# Patient Record
Sex: Female | Born: 1940 | Race: Black or African American | Hispanic: No | Smoking: Former smoker
Health system: Southern US, Community
[De-identification: ages and names within clinical notes are randomized; demographics above are authoritative.]

## PROBLEM LIST (undated history)

## (undated) DIAGNOSIS — J984 Other disorders of lung: Secondary | ICD-10-CM

## (undated) DIAGNOSIS — Z87891 Personal history of nicotine dependence: Secondary | ICD-10-CM

## (undated) DIAGNOSIS — J309 Allergic rhinitis, unspecified: Secondary | ICD-10-CM

## (undated) DIAGNOSIS — J449 Chronic obstructive pulmonary disease, unspecified: Secondary | ICD-10-CM

## (undated) DIAGNOSIS — J4489 Other specified chronic obstructive pulmonary disease: Secondary | ICD-10-CM

## (undated) DIAGNOSIS — R928 Other abnormal and inconclusive findings on diagnostic imaging of breast: Secondary | ICD-10-CM

## (undated) DIAGNOSIS — R030 Elevated blood-pressure reading, without diagnosis of hypertension: Secondary | ICD-10-CM

## (undated) HISTORY — DX: Personal history of nicotine dependence: Z87.891

## (undated) HISTORY — DX: Other disorders of lung: J98.4

## (undated) HISTORY — DX: Elevated blood-pressure reading, without diagnosis of hypertension: R03.0

## (undated) HISTORY — DX: Other specified chronic obstructive pulmonary disease: J44.89

## (undated) HISTORY — PX: BREAST SURGERY: SHX581

## (undated) HISTORY — DX: Chronic obstructive pulmonary disease, unspecified: J44.9

## (undated) HISTORY — DX: Allergic rhinitis, unspecified: J30.9

## (undated) HISTORY — DX: Other abnormal and inconclusive findings on diagnostic imaging of breast: R92.8

---

## 1967-12-20 HISTORY — PX: TUBAL LIGATION: SHX77

## 2009-10-23 ENCOUNTER — Ambulatory Visit: Payer: Self-pay | Admitting: Family Medicine

## 2009-10-23 LAB — HM PAP SMEAR

## 2009-11-18 ENCOUNTER — Ambulatory Visit: Payer: Self-pay | Admitting: Family Medicine

## 2009-12-01 ENCOUNTER — Ambulatory Visit: Payer: Self-pay | Admitting: Family Medicine

## 2010-06-03 ENCOUNTER — Ambulatory Visit: Payer: Self-pay | Admitting: General Surgery

## 2011-10-19 ENCOUNTER — Ambulatory Visit: Payer: Self-pay | Admitting: Family Medicine

## 2011-10-19 LAB — HM MAMMOGRAPHY

## 2011-10-20 IMAGING — CR DG CHEST 2V
1 series · 2 of 2 positions shown · non-contrast
Comparison: none

REASON FOR EXAM: Shortness of breath
COMMENTS:

[Series 1: view not recorded · 0.17mm/px · 2 of 2 slices shown]
[im 1/2]
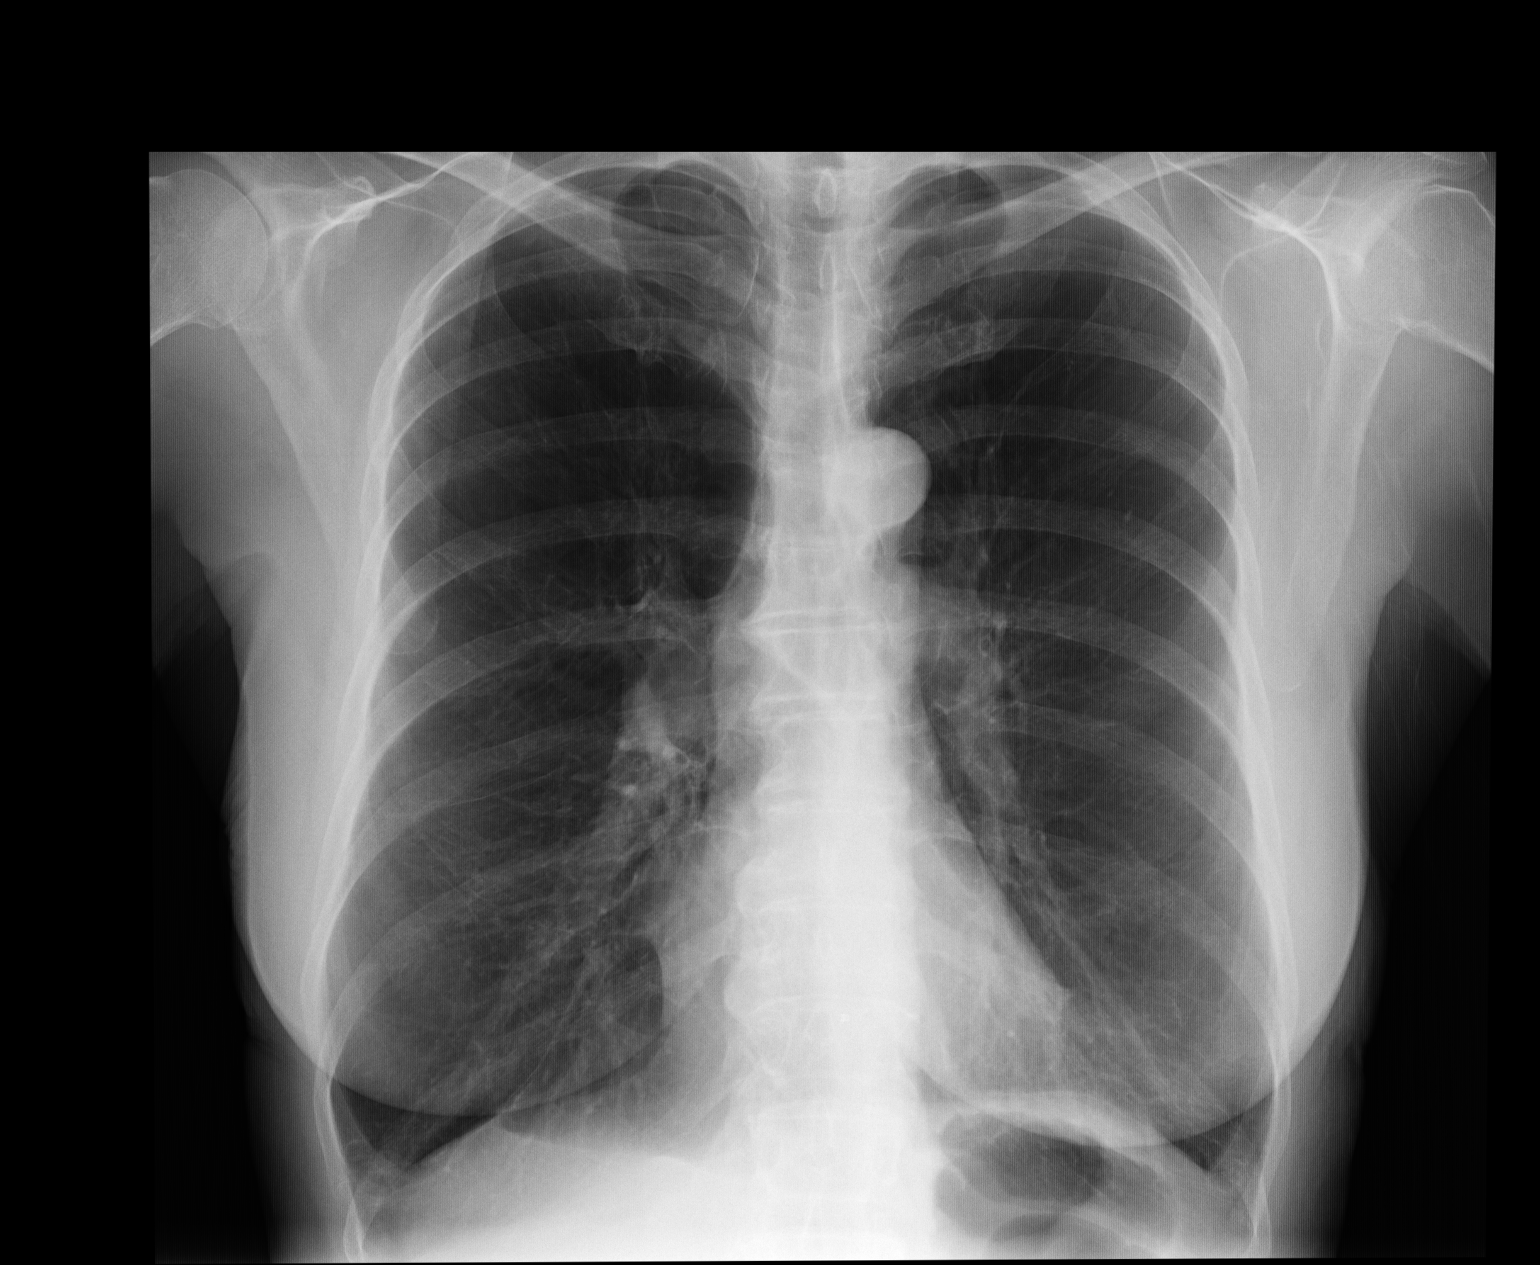
[im 2/2]
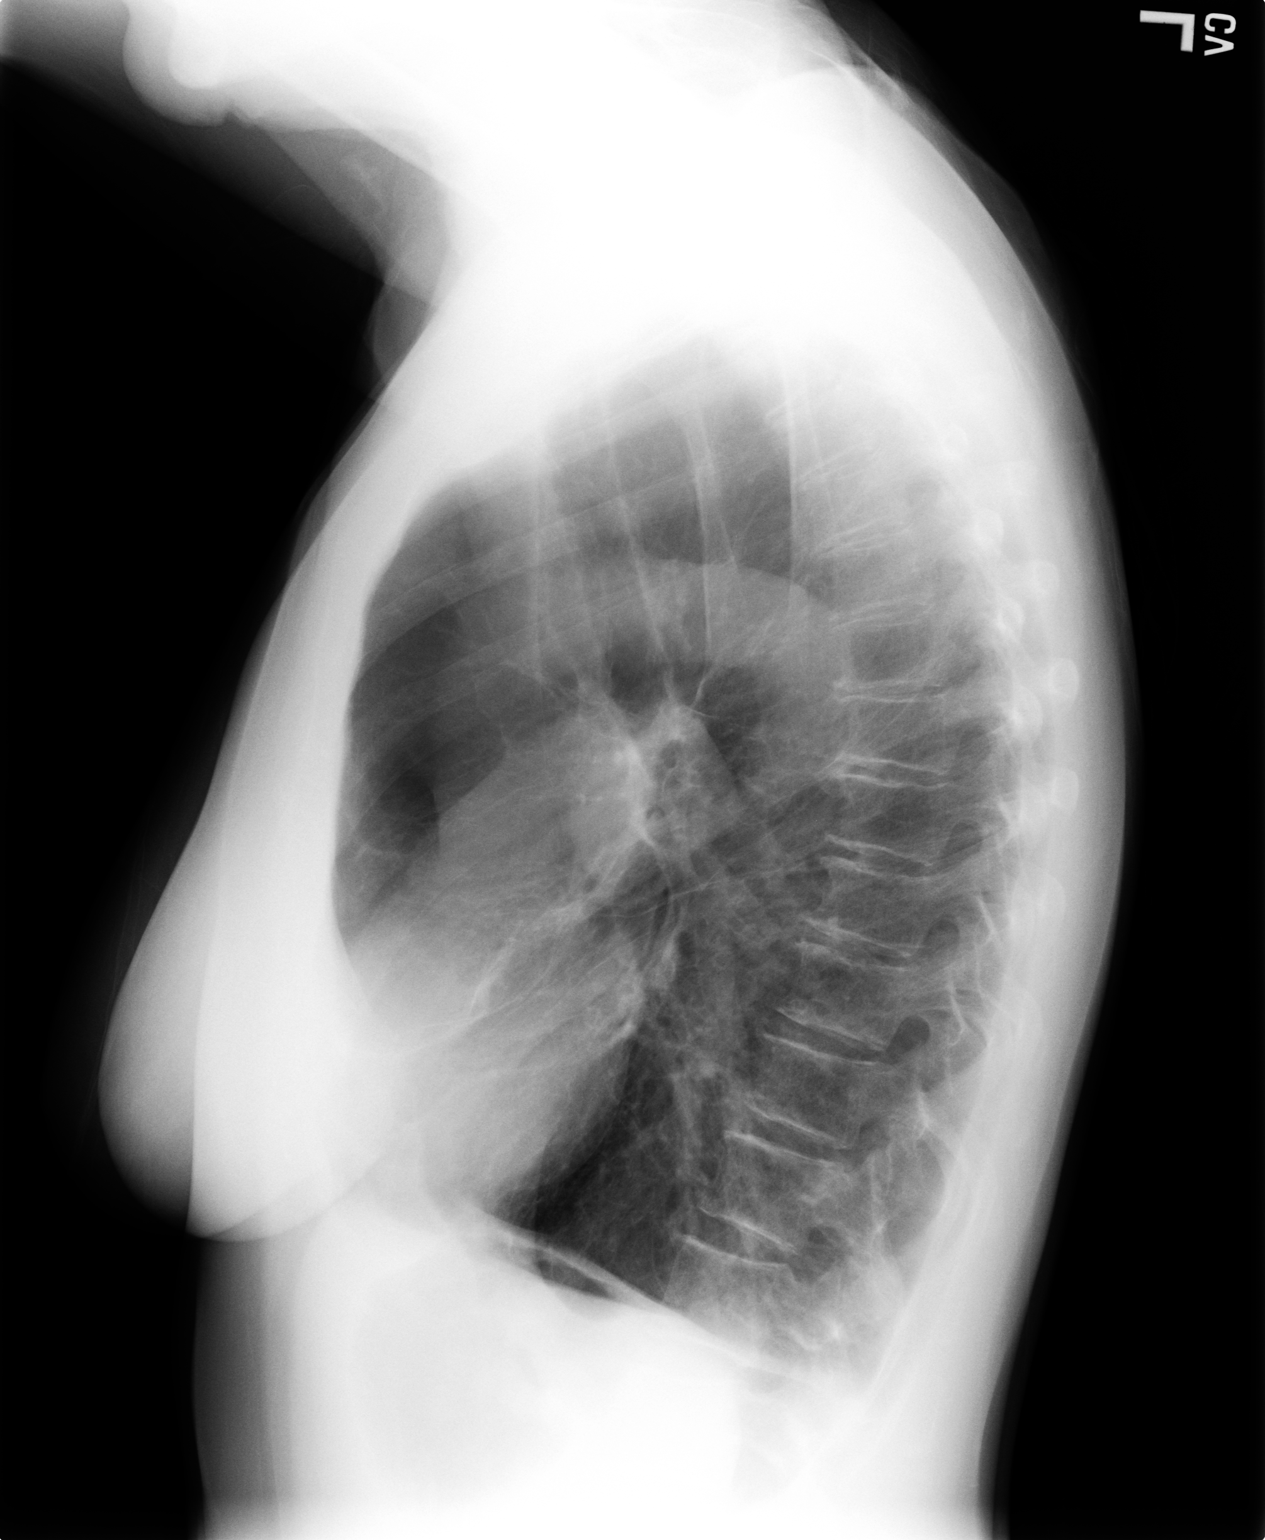

[2 of 2 positions shown; findings below may reference images not displayed]

PROCEDURE:     KDR - KDXR CHEST PA (OR AP) AND LAT  - October 23, 2009 [DATE]

RESULT:     The lung fields are clear. No pneumonia, pneumothorax or pleural
effusion is seen. The chest is hyperexpanded compatible with a history of
COPD or asthma. The mediastinal and osseous structures are normal in
appearance.
IMPRESSION: 1. The lung fields are clear.
2. Heart size is normal.
3. There is bilateral hyperexpansion of the lungs compatible with a history
of COPD or asthma.

## 2011-10-21 ENCOUNTER — Ambulatory Visit: Payer: Self-pay | Admitting: Family Medicine

## 2012-05-22 ENCOUNTER — Ambulatory Visit: Payer: Self-pay | Admitting: Family Medicine

## 2012-05-22 DIAGNOSIS — N951 Menopausal and female climacteric states: Secondary | ICD-10-CM | POA: Diagnosis not present

## 2012-05-22 DIAGNOSIS — M899 Disorder of bone, unspecified: Secondary | ICD-10-CM | POA: Diagnosis not present

## 2012-05-22 LAB — HM DEXA SCAN

## 2012-11-05 ENCOUNTER — Encounter: Payer: Self-pay | Admitting: *Deleted

## 2012-11-19 ENCOUNTER — Encounter: Payer: Self-pay | Admitting: Family Medicine

## 2012-12-05 ENCOUNTER — Encounter: Payer: Self-pay | Admitting: Family Medicine

## 2012-12-05 ENCOUNTER — Ambulatory Visit: Payer: Medicare Other

## 2012-12-05 ENCOUNTER — Ambulatory Visit: Payer: Medicare Other | Admitting: Family Medicine

## 2012-12-05 VITALS — BP 143/81 | HR 115 | Temp 98.0°F | Resp 17 | Ht 62.0 in | Wt 104.0 lb

## 2012-12-05 DIAGNOSIS — Z01419 Encounter for gynecological examination (general) (routine) without abnormal findings: Secondary | ICD-10-CM | POA: Diagnosis not present

## 2012-12-05 DIAGNOSIS — Z23 Encounter for immunization: Secondary | ICD-10-CM

## 2012-12-05 DIAGNOSIS — Z0000 Encounter for general adult medical examination without abnormal findings: Secondary | ICD-10-CM

## 2012-12-05 DIAGNOSIS — R0902 Hypoxemia: Secondary | ICD-10-CM | POA: Diagnosis not present

## 2012-12-05 DIAGNOSIS — R634 Abnormal weight loss: Secondary | ICD-10-CM | POA: Diagnosis not present

## 2012-12-05 DIAGNOSIS — F430 Acute stress reaction: Secondary | ICD-10-CM

## 2012-12-05 DIAGNOSIS — H00019 Hordeolum externum unspecified eye, unspecified eyelid: Secondary | ICD-10-CM

## 2012-12-05 DIAGNOSIS — J449 Chronic obstructive pulmonary disease, unspecified: Secondary | ICD-10-CM

## 2012-12-05 LAB — COMPREHENSIVE METABOLIC PANEL
ALT: 13 U/L (ref 0–35)
AST: 18 U/L (ref 0–37)
Alkaline Phosphatase: 36 U/L — ABNORMAL LOW (ref 39–117)
CO2: 28 mEq/L (ref 19–32)
Creat: 0.64 mg/dL (ref 0.50–1.10)
Sodium: 138 mEq/L (ref 135–145)
Total Bilirubin: 0.7 mg/dL (ref 0.3–1.2)
Total Protein: 6.9 g/dL (ref 6.0–8.3)

## 2012-12-05 LAB — POCT CBC
Hemoglobin: 13.5 g/dL (ref 12.2–16.2)
Lymph, poc: 3 (ref 0.6–3.4)
MCH, POC: 25.9 pg — AB (ref 27–31.2)
MCHC: 29.5 g/dL — AB (ref 31.8–35.4)
MID (cbc): 0.6 (ref 0–0.9)
MPV: 7.6 fL (ref 0–99.8)
POC Granulocyte: 4.1 (ref 2–6.9)
POC LYMPH PERCENT: 39 %L (ref 10–50)
POC MID %: 7.4 %M (ref 0–12)
Platelet Count, POC: 321 10*3/uL (ref 142–424)
RDW, POC: 13.9 %
WBC: 7.7 10*3/uL (ref 4.6–10.2)

## 2012-12-05 LAB — POCT URINALYSIS DIPSTICK
Leukocytes, UA: NEGATIVE
Protein, UA: NEGATIVE
Urobilinogen, UA: 0.2
pH, UA: 7

## 2012-12-05 LAB — TSH: TSH: 3.791 u[IU]/mL (ref 0.350–4.500)

## 2012-12-05 LAB — PULMONARY FUNCTION TEST

## 2012-12-05 NOTE — Patient Instructions (Addendum)
1. Routine general medical examination at a health care facility  POCT urinalysis dipstick, EKG 12-Lead, Pulmonary function test, IFOBT POC (occult bld, rslt in office), POCT CBC, Comprehensive metabolic panel, TSH  2. Need for prophylactic vaccination and inoculation against influenza  Flu vaccine greater than or equal to 69yo preservative free IM, POCT CBC, Comprehensive metabolic panel, TSH, Sedimentation Rate  3. Routine gynecological examination  Pap IG (Image Guided), POCT CBC, Comprehensive metabolic panel, TSH, Sedimentation Rate, MM Digital Screening  4. Hypoxia  DG Chest 2 View, POCT CBC, Comprehensive metabolic panel, TSH, Sedimentation Rate  5. Chronic airway obstruction, not elsewhere classified  Ambulatory referral to Pulmonology  6. Weight loss    7. Stress reaction    8. Hordeolum

## 2012-12-05 NOTE — Progress Notes (Signed)
[address]   [place], [place]  [address]   [phone]  Subjective:    Patient ID: Anita Murphy, female    DOB: October 11, 1941, 71 y.o.   MRN: [id number]  HPI This 71 y.o. female presents to establish care and for CPE.  Last physical 2011.   Pap smear 10/23/2009. Mammogram 10/19/11.  Colonoscopy never.  May consider in future a colonoscopy. Pneumovax 10/20/2011 Influenza vaccine 10/20/2011. Eye exam never. Bone density  2013. Dental exam 2013.  Tooth pulled.   1.  L eye soreness:  Using eye drops to L eye.  Felt a watery; no drainage; dark area along L upper lid.  Feels like foreign body in eye in mornings.  No blurred vision; no pain with blinking.  No photophobia.  No pain in eye.    2.  COPD:  Six month follow-up; about the same; Symbicort twice daily; Spiriva once daily.  Did get a paper; got into donut hole; ended up getting paperwork; will take $50 off; last time $65 on Symbicort; Spiriva still in donut hole.  Ran out of Ventolin in September with help.  Daughter notices increased work of breathing with exertion while walking up stairs.    3.  Weight loss: weight down nine pound in past six months; non-intentional; multiple family stressors; has home stress; denies verbal or domestic/physical abuse.   Mother also with Alzheimer's dementia; has been visiting with mother three days per week for past six months.  Thinks weight loss is stress induced.  No night sweats; no fevers; no nausea.  No bloating.     Review of Systems  Constitutional: Positive for unexpected weight change. Negative for fever, chills, diaphoresis, activity change, appetite change and fatigue.  HENT: Negative for hearing loss, ear pain, nosebleeds, congestion, sore throat, facial swelling, rhinorrhea, sneezing, drooling, mouth sores, trouble swallowing, neck pain, neck stiffness, dental problem, voice change, postnasal drip, sinus pressure, tinnitus and ear discharge.   Eyes: Positive for pain. Negative for  photophobia, discharge, redness, itching and visual disturbance.  Respiratory: Positive for shortness of breath. Negative for apnea, cough, choking, chest tightness, wheezing and stridor.   Cardiovascular: Negative for chest pain, palpitations and leg swelling.  Gastrointestinal: Negative for nausea, vomiting, abdominal pain, diarrhea, constipation, blood in stool, abdominal distention, anal bleeding and rectal pain.  Genitourinary: Negative for dysuria, urgency, frequency, hematuria, flank pain, decreased urine volume, vaginal bleeding, vaginal discharge, enuresis, difficulty urinating, genital sores, vaginal pain, menstrual problem, pelvic pain and dyspareunia.  Musculoskeletal: Negative for myalgias, back pain, joint swelling, arthralgias and gait problem.  Skin: Negative for color change, pallor, rash and wound.  Neurological: Negative for dizziness, tremors, seizures, syncope, facial asymmetry, speech difficulty, weakness, light-headedness, numbness and headaches.  Hematological: Negative for adenopathy. Does not bruise/bleed easily.  Psychiatric/Behavioral: Positive for dysphoric mood. Negative for suicidal ideas, hallucinations, behavioral problems, confusion, sleep disturbance, self-injury, decreased concentration and agitation. The patient is nervous/anxious. The patient is not hyperactive.         Past Medical History  Diagnosis Date  . Other diseases of lung, not elsewhere classified   . Allergic rhinitis, cause unspecified   . Shortness of breath   . Personal history of tobacco use, presenting hazards to health   . Elevated blood pressure reading without diagnosis of hypertension   . Chronic airway obstruction, not elsewhere classified   . Other (abnormal) findings on radiological examination of breast     Past Surgical History  Procedure Date  . Tubal ligation 1969  .  Breast cyst aspiration 2011    aspirated with resolution    Prior to Admission medications   Medication  Sig Start Date End Date Taking? Authorizing Provider  budesonide-formoterol (SYMBICORT) 160-4.5 MCG/ACT inhaler Inhale 2 puffs into the lungs 2 (two) times daily.   Yes Historical Provider, MD  tiotropium (SPIRIVA HANDIHALER) 18 MCG inhalation capsule Place 18 mcg into inhaler and inhale daily.   Yes Historical Provider, MD    No Known Allergies  History   Social History  . Marital Status: Married    Spouse Name: N/A    Number of Children: 4  . Years of Education: 12   Occupational History  . 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 Smoking status: Former Smoker -- 1.0 packs/day for 30 years    Types: Cigarettes  . Smokeless tobacco: Not on file     Comment: quit in 2004  . Alcohol Use: No  . Drug Use: No  . Sexually Active: No   Other Topics Concern  . Not on file   Social History Narrative   Always uses seat belts. Smoke alarm and carbon monoxide detector in the home. Guns in the home stored in locked cabinet. Married x 50 yrs, happy, no abuse. Lives with spouse, one daughter, and grandson. Exercise: walking daily x 30 minutes. Caffeine use: Coffee 2 servings per day.    Family History  Problem Relation Age of Onset  . Lung disease Sister   . Hypothyroidism Mother   . Aortic stenosis Mother   . Cancer Father     Mesothelioma  . Heart disease Father   . Cancer Brother     Objective:   Physical Exam  Nursing note and vitals reviewed. Constitutional: She is oriented to person, place, and time. She appears well-developed and well-nourished. No distress.  HENT:  Head: Normocephalic and atraumatic.  Right Ear: External ear normal.  Left Ear: External ear normal.  Nose: Nose normal.  Mouth/Throat: Oropharynx is clear and moist. No oropharyngeal exudate.  Eyes: Conjunctivae normal are normal. Pupils are equal, round, and reactive to light. Right eye exhibits no discharge. Left eye exhibits hordeolum. Left eye exhibits no discharge. No  scleral icterus.  Neck: Normal range of motion. Neck supple. No thyromegaly present.  Cardiovascular: Normal rate, regular rhythm, normal heart sounds and intact distal pulses.  Exam reveals no gallop and no friction rub.   No murmur heard. Pulmonary/Chest: Effort normal. No respiratory distress. She has no wheezes. She has no rales.       DISTANT BREATH SOUNDS THROUGHOUT.  Abdominal: Soft. Bowel sounds are normal. She exhibits no distension and no mass. There is no tenderness. There is no rebound and no guarding. Hernia confirmed negative in the right inguinal area and confirmed negative in the left inguinal area.  Genitourinary: Vagina normal and uterus normal. Rectal exam shows no external hemorrhoid, no fissure, no mass, no tenderness and anal tone normal. Guaiac negative stool. No breast swelling, tenderness, discharge or bleeding. There is no rash, tenderness or lesion on the right labia. There is no rash, tenderness or lesion on the left labia. Cervix exhibits no motion tenderness, no discharge and no friability. Right adnexum displays no mass, no tenderness and no fullness. Left adnexum displays no mass, no tenderness and no fullness. No vaginal discharge found.  Musculoskeletal: Normal range of motion.  Lymphadenopathy:    She has no cervical adenopathy.  Right: No inguinal adenopathy present.       Left: No inguinal adenopathy present.  Neurological: She is alert and oriented to person, place, and time. She has normal reflexes. No cranial nerve deficit. She exhibits normal muscle tone. Coordination normal.  Skin: Skin is warm and dry. No rash noted. She is not diaphoretic. No erythema. No pallor.  Psychiatric: She has a normal mood and affect. Her behavior is normal. Judgment and thought content normal.    EKG:  NSR;    [facility] reading (PRIMARY) by  Dr. Katrinka Blazing.  CXR: +prominent aortic notch. SPIROMETRY:  FVC 59%, FEVE1%  30%; FEV1/FVC %  51% -- SEVERE OBSTRUCTION.   Assessment &  Plan:   1. Routine general medical examination at a health care facility  POCT urinalysis dipstick, EKG 12-Lead, Pulmonary function test, IFOBT POC (occult bld, rslt in office), POCT CBC, Comprehensive metabolic panel, TSH  2. Need for prophylactic vaccination and inoculation against influenza  Flu vaccine greater than or equal to 3yo preservative free IM, POCT CBC, Comprehensive metabolic panel, TSH, Sedimentation Rate  3. Routine gynecological examination  Pap IG (Image Guided), POCT CBC, Comprehensive metabolic panel, TSH, Sedimentation Rate, MM Digital Screening  4. Hypoxia  DG Chest 2 View, POCT CBC, Comprehensive metabolic panel, TSH, Sedimentation Rate  5. Chronic airway obstruction, not elsewhere classified  Ambulatory referral to Pulmonology  6. Weight loss    7. Stress reaction    8. Hordeolum

## 2012-12-07 ENCOUNTER — Encounter: Payer: Self-pay | Admitting: Family Medicine

## 2012-12-07 DIAGNOSIS — J449 Chronic obstructive pulmonary disease, unspecified: Secondary | ICD-10-CM | POA: Insufficient documentation

## 2012-12-07 DIAGNOSIS — H00019 Hordeolum externum unspecified eye, unspecified eyelid: Secondary | ICD-10-CM | POA: Insufficient documentation

## 2012-12-07 DIAGNOSIS — Z23 Encounter for immunization: Secondary | ICD-10-CM | POA: Insufficient documentation

## 2012-12-07 DIAGNOSIS — Z0000 Encounter for general adult medical examination without abnormal findings: Secondary | ICD-10-CM | POA: Insufficient documentation

## 2012-12-07 DIAGNOSIS — F430 Acute stress reaction: Secondary | ICD-10-CM | POA: Insufficient documentation

## 2012-12-07 DIAGNOSIS — Z01419 Encounter for gynecological examination (general) (routine) without abnormal findings: Secondary | ICD-10-CM | POA: Insufficient documentation

## 2012-12-07 DIAGNOSIS — R634 Abnormal weight loss: Secondary | ICD-10-CM | POA: Insufficient documentation

## 2012-12-07 LAB — PAP IG (IMAGE GUIDED)

## 2012-12-07 NOTE — Assessment & Plan Note (Signed)
New.  Pulse oximetry decreases to 88% with ambulation; pulse oximetry 96% at rest.  Pt declined rx for oxygen PRN. Declined nocturnal oximetry.  Obtain CXR. Likely due to COPD severe.

## 2012-12-07 NOTE — Assessment & Plan Note (Signed)
New.  Weight down 9 pounds in six months; obtain labs.  Obtain CXR.  Pap smear obtained; refer for mammogram. Hemosure negative in office yet no previous colonoscopy.  Pt admits to multiple family stressors in past six months.  Monitor closely; pt to work on weight gain.

## 2012-12-07 NOTE — Assessment & Plan Note (Signed)
New.  Pt would not discuss at length during visit; denies s/s of depression or anxiety; denies SI/HI.  Denies domestic violence.  Good family support.  Agreeable to RTC for acute worsening.

## 2012-12-07 NOTE — Assessment & Plan Note (Signed)
Administered at visit 

## 2012-12-07 NOTE — Assessment & Plan Note (Signed)
New.  L upper eyelid; recommend warm compresses tid for next week.  If no improvement in one week, to call office.

## 2012-12-07 NOTE — Assessment & Plan Note (Signed)
Persistent.  Compliance with Symbicort and Spiriva.  Declined rx for PRN oxygen. Declined order for nocturnal oximetry. Agreeable to pulmonology consultation for further management.

## 2012-12-07 NOTE — Assessment & Plan Note (Signed)
Anticipatory guidance --- weight gain.  Pap smear obtained.  Refer for mammogram.  Bone density scan UTD.  Immunizations -- s/p flu vaccine in office; check on coverage of Zostavax.   No evidence of depression by Beck's depression scale. Independent with ADLs.  No hearing loss; low fall risk.  FULL CODE.

## 2012-12-07 NOTE — Assessment & Plan Note (Signed)
Pap smear obtained; refer for mammogram. 

## 2013-02-02 NOTE — Progress Notes (Signed)
Reviewed and agree.

## 2013-04-17 ENCOUNTER — Telehealth: Payer: Self-pay

## 2013-04-17 NOTE — Telephone Encounter (Signed)
Pended, please advise on refills.

## 2013-04-17 NOTE — Telephone Encounter (Signed)
Pt states [facility] in [place] has sent in a refill request for her symbicort and spiriva.  Pt wants to verify that we have recd. Please call pt to advise

## 2013-04-18 MED ORDER — BUDESONIDE-FORMOTEROL FUMARATE 160-4.5 MCG/ACT IN AERO: 2.0000 | INHALATION_SPRAY | Freq: Two times a day (BID) | RESPIRATORY_TRACT | Status: DC

## 2013-04-18 MED ORDER — TIOTROPIUM BROMIDE MONOHYDRATE 18 MCG IN CAPS: 18.0000 ug | ORAL_CAPSULE | Freq: Every day | RESPIRATORY_TRACT | Status: DC

## 2013-04-18 NOTE — Telephone Encounter (Signed)
Called her to advise this is done.

## 2013-04-18 NOTE — Telephone Encounter (Signed)
Done

## 2013-04-29 ENCOUNTER — Telehealth: Payer: Self-pay

## 2013-05-03 ENCOUNTER — Institutional Professional Consult (permissible substitution): Payer: Self-pay | Admitting: Internal Medicine

## 2013-05-31 ENCOUNTER — Ambulatory Visit (INDEPENDENT_AMBULATORY_CARE_PROVIDER_SITE_OTHER): Payer: Medicare Other | Admitting: Internal Medicine

## 2013-05-31 ENCOUNTER — Encounter: Payer: Self-pay | Admitting: Internal Medicine

## 2013-05-31 VITALS — BP 162/86 | HR 70 | Temp 97.5°F | Ht 61.25 in | Wt 106.0 lb

## 2013-05-31 DIAGNOSIS — J449 Chronic obstructive pulmonary disease, unspecified: Secondary | ICD-10-CM

## 2013-05-31 MED ORDER — LEVALBUTEROL TARTRATE 45 MCG/ACT IN AERO: 1.0000 | INHALATION_SPRAY | RESPIRATORY_TRACT | Status: DC | PRN

## 2013-05-31 NOTE — Progress Notes (Signed)
  Subjective:    Patient ID: Anita Murphy, female    DOB: Sep 08, 1941  MRN: [id number]  HPI  84 yobf quit smoking 2004 with no resp problems until around 2009 referred to pulmonary clinic 05/31/2013 by DR Nilda Simmer for ? Copd    05/31/2013 1st pulmonary ov cc acutely short of breath p exp to cleaner in 2009  and since then variable doe on symbicort and spiriva  - seems better with saba for only  A few hours then wors again.   No obvious daytime variabilty or assoc chronic cough or cp or chest tightness, subjective wheeze overt sinus or hb symptoms. No unusual other exposure hx or h/o childhood pna/ asthma or knowledge of premature birth.   Sleeping ok without nocturnal  or early am exacerbation  of respiratory  c/o's or need for noct saba. Also denies any obvious fluctuation of symptoms with weather or environmental changes or other aggravating or alleviating factors except as outlined above   Review of Systems  Constitutional: Negative for fever, chills and unexpected weight change.  HENT: Negative for ear pain, nosebleeds, congestion, sore throat, rhinorrhea, sneezing, trouble swallowing, dental problem, voice change, postnasal drip and sinus pressure.   Eyes: Negative for visual disturbance.  Respiratory: Positive for shortness of breath. Negative for cough and choking.   Cardiovascular: Negative for chest pain and leg swelling.  Gastrointestinal: Negative for vomiting, abdominal pain and diarrhea.  Genitourinary: Negative for difficulty urinating.  Musculoskeletal: Negative for arthralgias.  Skin: Negative for rash.  Neurological: Negative for tremors, syncope and headaches.  Hematological: Does not bruise/bleed easily.       Objective:   Physical Exam  amb bf nad  Wt Readings from Last 3 Encounters:  05/31/13 106 lb (48.081 kg)  12/05/12 104 lb (47.174 kg)  10/20/11 121 lb (54.885 kg)    HEENT mild turbinate edema.  Oropharynx no thrush or excess pnd or cobblestoning.   No JVD or cervical adenopathy. Mild accessory muscle hypertrophy. Trachea midline, nl thryroid. Chest was hyperinflated by percussion with diminished breath sounds and moderate increased exp time without wheeze. Hoover sign positive at mid inspiration. Regular rate and rhythm without murmur gallop or rub or increase P2 or edema.  Abd: no hsm, nl excursion. Ext warm without cyanosis or clubbing.   cxr nov 2010 c/w copd     Assessment & Plan:

## 2013-05-31 NOTE — Patient Instructions (Addendum)
Continue Symbicort 160 Take 2 puffs first thing in am and then another 2 puffs about 12 hours later.  And spiriva one daily   Only use your albuterol (xopenex)as a rescue medication to be used if you can't catch your breath by resting or doing a relaxed purse lip breathing pattern. The less you use it, the better it will work when you need it.  Ok to 2 puffs every 4 hours if needed  Please schedule a follow up office visit in 4-6  weeks, sooner if needed with pfts on return

## 2013-06-02 NOTE — Assessment & Plan Note (Addendum)
Severe by previous pft's GOLD III or IV with difficult to control symptoms  DDX of  difficult airways managment all start with A and  include Adherence, Ace Inhibitors, Acid Reflux, Active Sinus Disease, Alpha 1 Antitripsin deficiency, Anxiety masquerading as Airways dz,  ABPA,  allergy(esp in young), Aspiration (esp in elderly), Adverse effects of DPI,  Active smokers, plus two Bs  = Bronchiectasis and Beta blocker use..and one C= CHF  Adherence is always the initial "prime suspect" and is a multilayered concern that requires a "trust but verify" approach in every patient - starting with knowing how to use medications, especially inhalers, correctly, keeping up with refills and understanding the fundamental difference between maintenance and prns vs those medications only taken for a very short course and then stopped and not refilled.   The proper method of use, as well as anticipated side effects, of a metered-dose inhaler are discussed and demonstrated to the patient. Improved effectiveness after extensive coaching during this visit to a level of approximately  75% from a baseline of < 25% so may do better just by using the meds she's already supposed to be using.  Will regroup for fresh set of pft's p 6 weeks on combo of symbicort/spiriva and prn xopenex  See instructions for specific recommendations which were reviewed directly with the patient who was given a copy with highlighter outlining the key components.

## 2013-07-16 ENCOUNTER — Ambulatory Visit: Payer: Medicare Other | Admitting: Internal Medicine

## 2013-08-07 ENCOUNTER — Other Ambulatory Visit: Payer: Self-pay | Admitting: Family Medicine

## 2013-08-09 ENCOUNTER — Telehealth: Payer: Self-pay

## 2013-08-09 NOTE — Telephone Encounter (Signed)
Pt notified that symbicort was sent to pharm 2 days ago and that she would need an OV for more-however pt has an appt with pulm on 9/3 and will see if he will RF it for longer since he does her other 2 meds

## 2013-08-09 NOTE — Telephone Encounter (Signed)
Pt states that today she will be running out of her symbicort and would like to check the status of the request to refill that medication. Best# [phone]

## 2013-08-21 ENCOUNTER — Encounter: Payer: Self-pay | Admitting: Internal Medicine

## 2013-08-21 ENCOUNTER — Ambulatory Visit (INDEPENDENT_AMBULATORY_CARE_PROVIDER_SITE_OTHER): Payer: Medicare Other | Admitting: Internal Medicine

## 2013-08-21 VITALS — BP 150/90 | HR 80 | Temp 98.0°F | Ht 61.0 in | Wt 100.0 lb

## 2013-08-21 DIAGNOSIS — R634 Abnormal weight loss: Secondary | ICD-10-CM | POA: Diagnosis not present

## 2013-08-21 DIAGNOSIS — J449 Chronic obstructive pulmonary disease, unspecified: Secondary | ICD-10-CM

## 2013-08-21 LAB — PULMONARY FUNCTION TEST

## 2013-08-21 MED ORDER — TIOTROPIUM BROMIDE MONOHYDRATE 18 MCG IN CAPS: 18.0000 ug | ORAL_CAPSULE | Freq: Every day | RESPIRATORY_TRACT | Status: DC

## 2013-08-21 NOTE — Assessment & Plan Note (Addendum)
-  Spirometry 12/05/12 FVC 59%, FEV1%  30%; FEV1/FVC %  51%.  - PFT's  08/21/2013   FEV1  0.52 ( 33%) ratio 42 and no change B2  Pneumovax 10/20/2011.  Influenza vaccine 12/05/12. - hfa 75% p coaching 05/31/13 and 08/21/2013   DDX of  difficult airways managment all start with A and  include Adherence, Ace Inhibitors, Acid Reflux, Active Sinus Disease, Alpha 1 Antitripsin deficiency, Anxiety masquerading as Airways dz,  ABPA,  allergy(esp in young), Aspiration (esp in elderly), Adverse effects of DPI,  Active smokers, plus two Bs  = Bronchiectasis and Beta blocker use..and one C= CHF   Adherence is always the initial "prime suspect" and is a multilayered concern that requires a "trust but verify" approach in every patient - starting with knowing how to use medications, especially inhalers, correctly, keeping up with refills and understanding the fundamental difference between maintenance and prns vs those medications only taken for a very short course and then stopped and not refilled. The proper method of use, as well as anticipated side effects, of a metered-dose inhaler are discussed and demonstrated to the patient. Improved effectiveness after extensive coaching during this visit to a level of approximately  90% with dpi from baseline of close to zero and 75% with mdi with same baseline  Therefore don't really need to look further > if not able to use inhalers correctly on f/u may need to consider change to breo  Or stop spiriva and symbicort  and use anoro since did better with dpi than mdi    Each maintenance medication was reviewed in detail including most importantly the difference between maintenance and as needed and under what circumstances the prns are to be used.  Please see instructions for details which were reviewed in writing and the patient given a copy.

## 2013-08-21 NOTE — Progress Notes (Signed)
  Subjective:    Patient ID: Anita Murphy, female    DOB: [birth year]  MRN: [id number]    Brief patient profile:  62 yobf quit smoking 2004 with no resp problems until around 2009 referred to pulmonary clinic 05/31/2013 by DR Nilda Simmer for ? Copd > proved to have GOLD III severity 08/21/2013   HPI 05/31/2013 1st pulmonary ov cc acutely short of breath p exp to cleaner in 2009  and since then variable doe on symbicort and spiriva  - seems better with saba for only  A few hours then worse again.  rec Continue Symbicort 160 Take 2 puffs first thing in am and then another 2 puffs about 12 hours later.  And spiriva one daily  Only use your albuterol (xopenex)as a rescue medication   08/21/2013 f/u ov/[facility]  GOLD III copd Chief Complaint  Patient presents with  . Followup with PFT    Pt states her breathing has improved some since her last visit. No new co's today.    other than steps does ok with adls, grocery shopping. Not doing well with use of inhalers (see a/p).   No obvious daytime variabilty or assoc chronic cough or cp or chest tightness, subjective wheeze overt sinus or hb symptoms. No unusual other exposure hx or h/o childhood pna/ asthma or knowledge of premature birth.   Sleeping ok without nocturnal  or early am exacerbation  of respiratory  c/o's or need for noct saba. Also denies any obvious fluctuation of symptoms with weather or environmental changes or other aggravating or alleviating factors except as outlined above  .   Current Medications, Allergies, Past Medical History, Past Surgical History, Family History, and Social History were reviewed in [employer] record.  ROS  The following are not active complaints unless bolded sore throat, dysphagia, dental problems, itching, sneezing,  nasal congestion or excess/ purulent secretions, ear ache,   fever, chills, sweats, unintended wt loss, pleuritic or exertional cp, hemoptysis,  orthopnea pnd or leg  swelling, presyncope, palpitations, heartburn, abdominal pain, anorexia, nausea, vomiting, diarrhea  or change in bowel or urinary habits, change in stools or urine, dysuria,hematuria,  rash, arthralgias, visual complaints, headache, numbness weakness or ataxia or problems with walking or coordination,  change in mood/affect or memory.          Objective:   Physical Exam  amb bf nad  08/21/2013         100  Wt Readings from Last 3 Encounters:  05/31/13 106 lb (48.081 kg)  12/05/12 104 lb (47.174 kg)  10/20/11 121 lb (54.885 kg)    HEENT mild turbinate edema.  Oropharynx no thrush or excess pnd or cobblestoning.  No JVD or cervical adenopathy. Mild accessory muscle hypertrophy. Trachea midline, nl thryroid. Chest was hyperinflated by percussion with diminished breath sounds and moderate increased exp time without wheeze. Hoover sign positive at mid inspiration. Regular rate and rhythm without murmur gallop or rub or increase P2 or edema.  Abd: no hsm, nl excursion. Ext warm without cyanosis or clubbing.   cxr nov 2010 c/w copd     Assessment & Plan:

## 2013-08-21 NOTE — Patient Instructions (Addendum)
Work on inhaler technique:  relax and gently blow all the way out then take a nice smooth deep breath back in, triggering the inhaler at same time you start breathing in.  Hold for up to 5 seconds if you can.  Rinse and gargle with water when done   If your mouth or throat starts to bother you,   I suggest you time the inhaler to your dental care and after using the inhaler(s) brush teeth and tongue with a baking soda containing toothpaste and when you rinse this out, gargle with it first to see if this helps your mouth and throat.     Please schedule a follow up visit in 3 months but call sooner if needed  Late add   if not able to use inhalers correctly on f/u may need to consider change to breo  Or stop spiriva and symbicort and use anoro since did better with dpi than mdi

## 2013-08-21 NOTE — Progress Notes (Signed)
Spirometry before and after done today. Pt was unable to perform dlco and lung volumes.

## 2013-08-21 NOTE — Assessment & Plan Note (Signed)
Wt Readings from Last 3 Encounters:  08/21/13 100 lb (45.36 kg)  05/31/13 106 lb (48.081 kg)  12/05/12 104 lb (47.174 kg)     Notes indicate cxr planned but none in system since 2010 > defer w/u to Dr Katrinka Blazing

## 2013-09-06 ENCOUNTER — Other Ambulatory Visit: Payer: Self-pay | Admitting: Family Medicine

## 2013-09-06 NOTE — Telephone Encounter (Signed)
Needs OV.  

## 2013-09-12 ENCOUNTER — Encounter: Payer: Self-pay | Admitting: Internal Medicine

## 2013-10-31 ENCOUNTER — Other Ambulatory Visit: Payer: Self-pay | Admitting: Physician Assistant

## 2013-11-05 ENCOUNTER — Telehealth: Payer: Self-pay | Admitting: Internal Medicine

## 2013-11-05 MED ORDER — BUDESONIDE-FORMOTEROL FUMARATE 160-4.5 MCG/ACT IN AERO: 2.0000 | INHALATION_SPRAY | Freq: Two times a day (BID) | RESPIRATORY_TRACT | Status: DC

## 2013-11-05 NOTE — Telephone Encounter (Signed)
Refill sent. Pt advised. Jennifer Castillo, CMA  

## 2013-11-19 ENCOUNTER — Ambulatory Visit (INDEPENDENT_AMBULATORY_CARE_PROVIDER_SITE_OTHER)
Admission: RE | Admit: 2013-11-19 | Discharge: 2013-11-19 | Disposition: A | Discharge status: Home / Self Care | Payer: Medicare Other | Source: Ambulatory Visit | Attending: Internal Medicine | Admitting: Internal Medicine

## 2013-11-19 ENCOUNTER — Encounter: Payer: Self-pay | Admitting: Internal Medicine

## 2013-11-19 ENCOUNTER — Ambulatory Visit (INDEPENDENT_AMBULATORY_CARE_PROVIDER_SITE_OTHER): Payer: Medicare Other | Admitting: Internal Medicine

## 2013-11-19 VITALS — BP 140/80 | HR 63 | Temp 97.4°F | Ht 61.0 in | Wt 104.0 lb

## 2013-11-19 DIAGNOSIS — J449 Chronic obstructive pulmonary disease, unspecified: Secondary | ICD-10-CM

## 2013-11-19 NOTE — Patient Instructions (Addendum)
Please remember to go to the  x-ray department downstairs for your tests - we will call you with the results when they are available.  Work on inhaler technique:  relax and gently blow all the way out then take a nice smooth deep breath back in, triggering the inhaler at same time you start breathing in.  Hold for up to 5 seconds if you can.  Rinse and gargle with water when done   Please schedule a follow up visit in 6 months but call sooner if needed

## 2013-11-19 NOTE — Assessment & Plan Note (Addendum)
-  Spirometry 12/05/12 FEV1  0.51 (30% ratio 40    - PFT's  08/21/2013   FEV1  0.52 ( 33%) ratio 42 and no change B2  The proper method of use, as well as anticipated side effects, of a metered-dose inhaler are discussed and demonstrated to the patient. Improved effectiveness after extensive coaching during this visit to a level of approximately  75% from baseline of < 25%  Not clear she'll be able to master hfa as she gets older if can't maintain better fxn now so low threshold to try breo if condition worsens at all.    Each maintenance medication was reviewed in detail including most importantly the difference between maintenance and as needed and under what circumstances the prns are to be used.  Please see instructions for details which were reviewed in writing and the patient given a copy.

## 2013-11-19 NOTE — Progress Notes (Signed)
Subjective:    Patient ID: Anita Murphy, female    DOB: 1941/03/02  MRN: [id number]    Brief patient profile:  34  yobf quit smoking 2004 with no resp problems until around 2009 referred to pulmonary clinic 05/31/2013 by DR Nilda Simmer for ? Copd > proved to have GOLD III severity 08/21/2013   HPI 05/31/2013 1st pulmonary ov cc acutely short of breath p exp to cleaner in 2009  and since then variable doe on symbicort and spiriva  - seems better with saba x  few hours then worse again.  rec Continue Symbicort 160 Take 2 puffs first thing in am and then another 2 puffs about 12 hours later.  And spiriva one daily  Only use your albuterol (xopenex)as a rescue medication   08/21/2013 f/u ov/Lekeith Wulf  GOLD III copd Chief Complaint  Patient presents with  . Followup with PFT    Pt states her breathing has improved some since her last visit. No new co's today.   other than steps does ok with adls, grocery shopping. Not doing well with use of inhalers (see a/p).  rec Work on inhaler technique:     Please schedule a follow up visit in 3 months but call sooner if needed  Late add   if not able to use inhalers correctly on f/u may need to consider change to breo  Or stop spiriva and symbicort and use anoro since did better with dpi than mdi  11/19/2013 f/u ov/Zia Kanner re: GOLD III COPD/ unable to use hfa effectively  Chief Complaint  Patient presents with  . Follow-up    Pt states that her breathing continues to improve. No new co's today.    No need for saba , not  limited from desired activities   No obvious daytime variabilty or assoc chronic cough or cp or chest tightness, subjective wheeze overt sinus or hb symptoms. No unusual other exposure hx or h/o childhood pna/ asthma or knowledge of premature birth.   Sleeping ok without nocturnal  or early am exacerbation  of respiratory  c/o's or need for noct saba. Also denies any obvious fluctuation of symptoms with weather or environmental changes or  other aggravating or alleviating factors except as outlined above  .   Current Medications, Allergies, Past Medical History, Past Surgical History, Family History, and Social History were reviewed in [employer] record.  ROS  The following are not active complaints unless bolded sore throat, dysphagia, dental problems, itching, sneezing,  nasal congestion or excess/ purulent secretions, ear ache,   fever, chills, sweats, unintended wt loss, pleuritic or exertional cp, hemoptysis,  orthopnea pnd or leg swelling, presyncope, palpitations, heartburn, abdominal pain, anorexia, nausea, vomiting, diarrhea  or change in bowel or urinary habits, change in stools or urine, dysuria,hematuria,  rash, arthralgias, visual complaints, headache, numbness weakness or ataxia or problems with walking or coordination,  change in mood/affect or memory.          Objective:   Physical Exam  amb bf nad  11/19/2013       104  08/21/2013         100  Wt Readings from Last 3 Encounters:  05/31/13 106 lb (48.081 kg)  12/05/12 104 lb (47.174 kg)  10/20/11 121 lb (54.885 kg)    HEENT mild turbinate edema.  Oropharynx no thrush or excess pnd or cobblestoning.  No JVD or cervical adenopathy. Mild accessory muscle hypertrophy. Trachea midline, nl thryroid. Chest was hyperinflated by percussion  with diminished breath sounds and moderate increased exp time without wheeze. Hoover sign positive at mid inspiration. Regular rate and rhythm without murmur gallop or rub or increase P2 or edema.  Abd: no hsm, nl excursion. Ext warm without cyanosis or clubbing.     CXR  11/19/2013 :  Mediastinum and hilar structures are normal. Mild basilar infiltrates versus atelectasis noted. Follow-up chest x-ray suggested to demonstrate clearing. No acute bony abnormality. Degenerative changes thoracic spine. My over read: nothing acute at all      Assessment & Plan:

## 2013-11-20 NOTE — Progress Notes (Signed)
Quick Note:  Spoke with pt and notified of results per Dr. Wert. Pt verbalized understanding and denied any questions.  ______ 

## 2014-01-07 ENCOUNTER — Telehealth: Payer: Self-pay | Admitting: Emergency Medicine

## 2014-01-07 MED ORDER — BUDESONIDE-FORMOTEROL FUMARATE 160-4.5 MCG/ACT IN AERO: 2.0000 | INHALATION_SPRAY | Freq: Two times a day (BID) | RESPIRATORY_TRACT | Status: DC

## 2014-01-07 NOTE — Telephone Encounter (Signed)
RX has been sent for pt  LMTCB x1

## 2014-01-08 NOTE — Telephone Encounter (Signed)
Pt advised. Dianely Krehbiel, CMA  

## 2014-10-02 ENCOUNTER — Telehealth: Payer: Self-pay | Admitting: Internal Medicine

## 2014-10-02 MED ORDER — LEVALBUTEROL TARTRATE 45 MCG/ACT IN AERO: 1.0000 | INHALATION_SPRAY | RESPIRATORY_TRACT | Status: DC | PRN

## 2014-10-02 MED ORDER — TIOTROPIUM BROMIDE MONOHYDRATE 18 MCG IN CAPS: 18.0000 ug | ORAL_CAPSULE | Freq: Every day | RESPIRATORY_TRACT | Status: DC

## 2014-10-02 NOTE — Telephone Encounter (Signed)
Attempted to call pt but no VM has been set up.  rx for these 2 meds have been sent in for refills per pts request.  Will try to call the pt back later.

## 2014-10-03 NOTE — Telephone Encounter (Signed)
[facility]  [facility]

## 2014-10-06 NOTE — Telephone Encounter (Signed)
attmepted to call x 3.  The VM is full and not able to leave a message.  Will sign off of this message and wait for pt to call back.

## 2014-10-21 ENCOUNTER — Ambulatory Visit (INDEPENDENT_AMBULATORY_CARE_PROVIDER_SITE_OTHER)
Admission: RE | Admit: 2014-10-21 | Discharge: 2014-10-21 | Disposition: A | Discharge status: Home / Self Care | Payer: Medicare Other | Source: Ambulatory Visit | Attending: Internal Medicine | Admitting: Internal Medicine

## 2014-10-21 ENCOUNTER — Ambulatory Visit (INDEPENDENT_AMBULATORY_CARE_PROVIDER_SITE_OTHER): Payer: Medicare Other | Admitting: Internal Medicine

## 2014-10-21 ENCOUNTER — Encounter: Payer: Self-pay | Admitting: Internal Medicine

## 2014-10-21 DIAGNOSIS — J449 Chronic obstructive pulmonary disease, unspecified: Secondary | ICD-10-CM

## 2014-10-21 DIAGNOSIS — J439 Emphysema, unspecified: Secondary | ICD-10-CM | POA: Diagnosis not present

## 2014-10-21 DIAGNOSIS — R0602 Shortness of breath: Secondary | ICD-10-CM | POA: Diagnosis not present

## 2014-10-21 NOTE — Patient Instructions (Addendum)
Please remember to go to the x-ray department downstairs for your tests - we will call you with the results when they are available.  Work on inhaler technique:  relax and gently blow all the way out then take a nice smooth deep breath back in, triggering the inhaler at same time you start breathing in.  Hold for up to 5 seconds if you can.  Rinse and gargle with water when done     Please schedule a follow up visit in 3 months but call sooner if needed (bring your drug formulary with you so we can pick alternatives if needed)

## 2014-10-21 NOTE — Progress Notes (Signed)
Subjective:    Patient ID: Anita Murphy, female    DOB: 24-Apr-1941  MRN: [id number]    Brief patient profile:  [id number]  yobf quit smoking 2004 with no resp problems until around 2009 referred to pulmonary clinic 05/31/2013 by DR Nilda SimmerKristi Smith for ? Copd > proved to have GOLD III severity 08/21/2013   HPI 05/31/2013 1st pulmonary ov cc acutely short of breath p exp to cleaner in 2009  and since then variable doe on symbicort and spiriva  - seems better with saba x  few hours then worse again.  rec Continue Symbicort 160 Take 2 puffs first thing in am and then another 2 puffs about 12 hours later.  And spiriva one daily  Only use your albuterol (xopenex)as a rescue medication   08/21/2013 f/u ov/Reagan Behlke  GOLD III copd Chief Complaint  Patient presents with  . Followup with PFT    Pt states her breathing has improved some since her last visit. No new co's today.   other than steps does ok with adls, grocery shopping. Not doing well with use of inhalers (see a/p).  rec Work on inhaler technique:     Please schedule a follow up visit in 3 months but call sooner if needed  Late add   if not able to use inhalers correctly on f/u may need to consider change to breo  Or stop spiriva and symbicort and use anoro since did better with dpi than mdi  11/19/2013 f/u ov/Taesha Goodell re: GOLD III COPD/ unable to use hfa effectively  Chief Complaint  Patient presents with  . Follow-up    Pt states that her breathing continues to improve. No new co's today.   No need for saba , not  limited from desired activities  rec Please remember to go to the  x-ray department downstairs for your tests - we will call you with the results when they are available. Work on inhaler technique:     10/21/2014 f/u ov/Hafsah Hendler re: GOLD III copd  Chief Complaint  Patient presents with  . Follow-up    Pt states that her breathing is unchanged since the last visit. Using xopenex inhaler on average 2 x per day.   doe x getting excited  To  mailbox and back s stopping but definitely her limit   No noct symptoms  No obvious daytime variabilty or assoc chronic cough or cp or chest tightness, subjective wheeze overt sinus or hb symptoms. No unusual other exposure hx or h/o childhood pna/ asthma or knowledge of premature birth.   Sleeping ok without nocturnal  or early am exacerbation  of respiratory  c/o's or need for noct saba. Also denies any obvious fluctuation of symptoms with weather or environmental changes or other aggravating or alleviating factors except as outlined above  .   Current Medications, Allergies, Past Medical History, Past Surgical History, Family History, and Social History [facility].  ROS  The following are not active complaints unless bolded sore throat, dysphagia, dental problems, itching, sneezing,  nasal congestion or excess/ purulent secretions, ear ache,   fever, chills, sweats, unintended wt loss, pleuritic or exertional cp, hemoptysis,  orthopnea pnd or leg swelling, presyncope, palpitations, heartburn, abdominal pain, anorexia, nausea, vomiting, diarrhea  or change in bowel or urinary habits, change in stools or urine, dysuria,hematuria,  rash, arthralgias, visual complaints, headache, numbness weakness or ataxia or problems with walking or coordination,  change in mood/affect or memory.  Objective:   Physical Exam  amb bf nad  10/21/2014        87  11/19/2013       104  08/21/2013         100  Wt Readings from Last 3 Encounters:  05/31/13 106 lb (48.081 kg)  12/05/12 104 lb (47.174 kg)  10/20/11 121 lb (54.885 kg)    HEENT mild turbinate edema.  Oropharynx no thrush or excess pnd or cobblestoning.  No JVD or cervical adenopathy. Mild accessory muscle hypertrophy. Trachea midline, nl thryroid. Chest was hyperinflated by percussion with diminished breath sounds and moderate increased exp time without wheeze. Hoover sign positive at mid  inspiration. Regular rate and rhythm without murmur gallop or rub or increase P2 or edema.  Abd: no hsm, nl excursion. Ext warm without cyanosis or clubbing.     CXR  10/21/2014 : Emphysema without acute cardiopulmonary disease.       Assessment & Plan:

## 2014-10-22 NOTE — Assessment & Plan Note (Signed)
-  Spirometry 12/05/12 FEV1  0.51 (30% ratio 40    - PFT's  08/21/2013   FEV1  0.52 ( 33%) ratio 42 and no change B2  Pneumovax 10/20/2011.  Influenza vaccine 12/05/12.    DDX of  difficult airways management all start with A and  include Adherence, Ace Inhibitors, Acid Reflux, Active Sinus Disease, Alpha 1 Antitripsin deficiency, Anxiety masquerading as Airways dz,  ABPA,  allergy(esp in young), Aspiration (esp in elderly), Adverse effects of DPI,  Active smokers, plus two Bs  = Bronchiectasis and Beta blocker use..and one C= CHF  Adherence is always the initial "prime suspect" and is a multilayered concern that requires a "trust but verify" approach in every patient - starting with knowing how to use medications, especially inhalers, correctly, keeping up with refills and understanding the fundamental difference between maintenance and prns vs those medications only taken for a very short course and then stopped and not refilled.  - ? Really taking her meds -The proper method of use, as well as anticipated side effects, of a metered-dose inhaler are discussed and demonstrated to the patient. Improved effectiveness after extensive coaching during this visit to a level of approximately  75% so needs to improve or go to laba/ics/sama neb format  ? chf > not obvious but note BP is elevated.   Unintended wt loss is also a very bad prognostic sign > no evidence of lung ca but this may be due to excess wob and poor caloric intake > plans "physical" per pt w/in a week so no labs drawn today but would def consider TSH and also do proBNP to see if any element of chf

## 2014-10-22 NOTE — Progress Notes (Signed)
Quick Note:  Spoke with pt and notified of results per Dr. Wert. Pt verbalized understanding and denied any questions.  ______ 

## 2014-12-04 ENCOUNTER — Telehealth: Payer: Self-pay

## 2014-12-04 NOTE — Telephone Encounter (Signed)
LMVM reminding patient to get his flu shot. 

## 2014-12-29 ENCOUNTER — Telehealth: Payer: Self-pay | Admitting: *Deleted

## 2014-12-29 NOTE — Telephone Encounter (Signed)
LMOM to call regarding the flu shot.

## 2015-01-01 ENCOUNTER — Telehealth: Payer: Self-pay | Admitting: Internal Medicine

## 2015-01-01 NOTE — Telephone Encounter (Signed)
lmtcb X1 for pt.  There is no Symbicort 160 or spiriva handihaler in the sample closet at this time.

## 2015-01-05 NOTE — Telephone Encounter (Signed)
ATC-unable to receive messages at this time due to voicemail box full. Will need to try again later.

## 2015-01-06 NOTE — Telephone Encounter (Signed)
Attempted to call pt but voicemail is still full. Will need to try back.

## 2015-01-07 NOTE — Telephone Encounter (Signed)
Attempted to call pt again today. Her voicemail is still full. We have tried to contact her several times with no luck. Will be closing this message as of today.

## 2015-01-17 ENCOUNTER — Other Ambulatory Visit: Payer: Self-pay | Admitting: Internal Medicine

## 2015-01-20 ENCOUNTER — Telehealth: Payer: Self-pay | Admitting: Internal Medicine

## 2015-01-20 NOTE — Telephone Encounter (Signed)
Attempted to call pt. No answer, will need to call back. Need clarification on which [facility] pharmacy she would like.

## 2015-01-21 MED ORDER — TIOTROPIUM BROMIDE MONOHYDRATE 18 MCG IN CAPS: 18.0000 ug | ORAL_CAPSULE | Freq: Every day | RESPIRATORY_TRACT | Status: DC

## 2015-01-21 NOTE — Telephone Encounter (Signed)
ATC home number and cell. Both numbers are without option to leave VM. WCB.

## 2015-01-21 NOTE — Telephone Encounter (Signed)
Spiriva Rx sent to pharmacy requested below. Left call back number on patient voicemail, unable to leave voice message d/t mailbox being full.

## 2015-01-21 NOTE — Telephone Encounter (Signed)
Calling back to verify pharmacy. [facility] [phone] - [place], [place] - [address] [facility] [address]

## 2015-01-21 NOTE — Telephone Encounter (Signed)
Pt aware.

## 2015-02-13 ENCOUNTER — Telehealth: Payer: Self-pay | Admitting: Internal Medicine

## 2015-02-13 MED ORDER — BUDESONIDE-FORMOTEROL FUMARATE 160-4.5 MCG/ACT IN AERO: 2.0000 | INHALATION_SPRAY | Freq: Two times a day (BID) | RESPIRATORY_TRACT | Status: DC

## 2015-02-13 NOTE — Telephone Encounter (Signed)
Advised pt that she is due for ROV with MW. This has been scheduled for 03/23/15 at 2pm. Rx for Symbicort will be sent in to last to this appointment.

## 2015-03-23 ENCOUNTER — Encounter: Payer: Self-pay | Admitting: Internal Medicine

## 2015-03-23 ENCOUNTER — Ambulatory Visit (INDEPENDENT_AMBULATORY_CARE_PROVIDER_SITE_OTHER): Payer: Medicare Other | Admitting: Internal Medicine

## 2015-03-23 VITALS — BP 142/90 | HR 104 | Ht 62.0 in | Wt 84.2 lb

## 2015-03-23 DIAGNOSIS — J449 Chronic obstructive pulmonary disease, unspecified: Secondary | ICD-10-CM | POA: Diagnosis not present

## 2015-03-23 MED ORDER — ACLIDINIUM BROMIDE 400 MCG/ACT IN AEPB: 1.0000 | INHALATION_SPRAY | Freq: Two times a day (BID) | RESPIRATORY_TRACT | Status: DC

## 2015-03-23 NOTE — Patient Instructions (Addendum)
Plan A = automatic = symbicort 160 x 2 puffs  then followed by tudorza x 1 puff and do this twice daily  Plan B= Only use your xopenex  as a rescue medication to be used if you can't catch your breath by resting or doing a relaxed purse lip breathing pattern.  - The less you use it, the better it will work when you need it. - Ok to use up to 2 puffs  every 4 hours if you must but call for immediate appointment if use goes up over your usual need - Don't leave home without it !!  (think of it like the spare tire for your car)   Work on inhaler technique:  relax and gently blow all the way out then take a nice smooth deep breath back in, triggering the inhaler at same time you start breathing in.  Hold for up to 5 seconds if you can.  Rinse and gargle with water when done      Please schedule a follow up visit in 3 months but call sooner if needed

## 2015-03-23 NOTE — Progress Notes (Signed)
Subjective:    Patient ID: Anita Murphy, female    DOB: 03-May-1941  MRN: [id number]    Brief patient profile:  17 yobf quit smoking 2004 with no resp problems until around 2009 referred to pulmonary clinic 05/31/2013 by DR Nilda Simmer for ? Copd > proved to have GOLD III severity 08/21/2013   HPI 05/31/2013 1st pulmonary ov cc acutely short of breath p exp to cleaner in 2009  and since then variable doe on symbicort and spiriva  - seems better with saba x  few hours then worse again.  rec Continue Symbicort 160 Take 2 puffs first thing in am and then another 2 puffs about 12 hours later.  And spiriva one daily  Only use your albuterol (xopenex)as a rescue medication   08/21/2013 f/u ov/Alixandria Friedt  GOLD III copd Chief Complaint  Patient presents with  . Followup with PFT    Pt states her breathing has improved some since her last visit. No new co's today.   other than steps does ok with adls, grocery shopping. Not doing well with use of inhalers (see a/p).  rec Work on inhaler technique:     Please schedule a follow up visit in 3 months but call sooner if needed  Late add   if not able to use inhalers correctly on f/u may need to consider change to breo  Or stop spiriva and symbicort and use anoro since did better with dpi than mdi  11/19/2013 f/u ov/Glendell Schlottman re: GOLD III COPD/ unable to use hfa effectively  Chief Complaint  Patient presents with  . Follow-up    Pt states that her breathing continues to improve. No new co's today.   No need for saba , not  limited from desired activities  rec  Work on inhaler technique:     10/21/2014 f/u ov/Erique Kaser re: GOLD III copd  Chief Complaint  Patient presents with  . Follow-up    Pt states that her breathing is unchanged since the last visit. Using xopenex inhaler on average 2 x per day.   doe x getting excited  To mailbox and back s stopping but definitely her limit   No noct symptoms rec Work on hfa  technique     03/23/2015 f/u ov/Kynesha Guerin re:  GOLD III copd spiriva/ symbicort Chief Complaint  Patient presents with  . Follow-up    Pt states her breathing is about the same. Some worse today "when I realized I was late".  She uses xopenex 1-2 x per day on average.   Uses xopenex around 1230 pm ,most every day not clear why No change doe x mb and back  Can't afford spiriva per insurance but doesn't know alternatives from formulary   No obvious daytime variabilty or assoc chronic cough or cp or chest tightness, subjective wheeze overt sinus or hb symptoms. No unusual other exposure hx or h/o childhood pna/ asthma or knowledge of premature birth.   Sleeping ok without nocturnal  or early am exacerbation  of respiratory  c/o's or need for noct saba. Also denies any obvious fluctuation of symptoms with weather or environmental changes or other aggravating or alleviating factors except as outlined above  .   Current Medications, Allergies, Past Medical History, Past Surgical History, Family History, and Social History were reviewed in [employer] record.  ROS  The following are not active complaints unless bolded sore throat, dysphagia, dental problems, itching, sneezing,  nasal congestion or excess/ purulent secretions, ear ache,  fever, chills, sweats, unintended wt loss, pleuritic or exertional cp, hemoptysis,  orthopnea pnd or leg swelling, presyncope, palpitations, heartburn, abdominal pain, anorexia, nausea, vomiting, diarrhea  or change in bowel or urinary habits, change in stools or urine, dysuria,hematuria,  rash, arthralgias, visual complaints, headache, numbness weakness or ataxia or problems with walking or coordination,  change in mood/affect or memory.          Objective:   Physical Exam  W/c bound bf nad  03/23/2015          84  10/21/2014        87  11/19/2013       104  08/21/2013         100  Wt Readings from Last 3 Encounters:  05/31/13 106 lb (48.081 kg)  12/05/12 104 lb (47.174 kg)  10/20/11  121 lb (54.885 kg)    HEENT mild turbinate edema.  Oropharynx no thrush or excess pnd or cobblestoning.  No JVD or cervical adenopathy. Mild accessory muscle hypertrophy. Trachea midline, nl thryroid. Chest was hyperinflated by percussion with diminished breath sounds and moderate increased exp time without wheeze. Hoover sign positive at mid inspiration. Regular rate and rhythm without murmur gallop or rub or increase P2 or edema.  Abd: no hsm, nl excursion. Ext warm without cyanosis or clubbing.     CXR  10/21/2014 : Emphysema without acute cardiopulmonary disease.       Assessment & Plan:

## 2015-03-25 ENCOUNTER — Encounter: Payer: Self-pay | Admitting: Internal Medicine

## 2015-03-25 NOTE — Assessment & Plan Note (Signed)
-  Spirometry 12/05/12 FEV1  0.51 (30% ratio 40    - PFT's  08/21/2013   FEV1  0.52 ( 33%) ratio 42 and no change B2  The proper method of use, as well as anticipated side effects, of a metered-dose inhaler are discussed and demonstrated to the patient. Improved effectiveness after extensive coaching during this visit to a level of approximately  75% and 90% with dpi  I had an extended discussion with the patient reviewing all relevant studies completed to date and  lasting 15 to 20 minutes of a 25 minute visit on the following ongoing concerns:  1) formulary issues reviewed in detail  2) since can't get spiriva thru present insurance rec trial of tudorza one bid   3)   Each maintenance medication was reviewed in detail including most importantly the difference between maintenance and as needed and under what circumstances the prns are to be used.  Please see instructions for details which were reviewed in writing and the patient given a copy.

## 2015-04-24 ENCOUNTER — Telehealth: Payer: Self-pay | Admitting: Internal Medicine

## 2015-04-24 MED ORDER — ACLIDINIUM BROMIDE 400 MCG/ACT IN AEPB: 1.0000 | INHALATION_SPRAY | Freq: Two times a day (BID) | RESPIRATORY_TRACT | Status: DC

## 2015-04-24 MED ORDER — BUDESONIDE-FORMOTEROL FUMARATE 160-4.5 MCG/ACT IN AERO: 2.0000 | INHALATION_SPRAY | Freq: Two times a day (BID) | RESPIRATORY_TRACT | Status: DC

## 2015-04-24 MED ORDER — LEVALBUTEROL TARTRATE 45 MCG/ACT IN AERO: 1.0000 | INHALATION_SPRAY | RESPIRATORY_TRACT | Status: DC | PRN

## 2015-04-24 NOTE — Telephone Encounter (Signed)
Rx's have been faxed in. Pt is aware. Nothing further was needed.

## 2015-05-14 ENCOUNTER — Other Ambulatory Visit: Payer: Self-pay | Admitting: Internal Medicine

## 2015-07-10 ENCOUNTER — Other Ambulatory Visit: Payer: Self-pay | Admitting: Internal Medicine

## 2015-07-10 ENCOUNTER — Telehealth: Payer: Self-pay | Admitting: Internal Medicine

## 2015-07-10 NOTE — Telephone Encounter (Signed)
ATC PT NA VM not set up. [facility] RX was already sent in today

## 2015-07-10 NOTE — Telephone Encounter (Signed)
Pt aware.

## 2015-07-22 ENCOUNTER — Ambulatory Visit: Payer: Medicare Other

## 2015-07-22 ENCOUNTER — Inpatient Hospital Stay (HOSPITAL_COMMUNITY)
Admission: EM | Admit: 2015-07-22 | Discharge: 2015-07-25 | DRG: 190 | Disposition: A | Discharge status: Home / Self Care | Payer: Medicare Other | Attending: Family Medicine | Admitting: Family Medicine

## 2015-07-22 ENCOUNTER — Emergency Department (HOSPITAL_COMMUNITY): Payer: Medicare Other

## 2015-07-22 ENCOUNTER — Ambulatory Visit (INDEPENDENT_AMBULATORY_CARE_PROVIDER_SITE_OTHER): Payer: Medicare Other | Admitting: Family Medicine

## 2015-07-22 ENCOUNTER — Encounter: Payer: Self-pay | Admitting: Family Medicine

## 2015-07-22 ENCOUNTER — Encounter (HOSPITAL_COMMUNITY): Payer: Self-pay | Admitting: Family Medicine

## 2015-07-22 VITALS — BP 100/66 | HR 60 | Temp 97.7°F | Resp 20 | Ht 62.25 in | Wt 78.2 lb

## 2015-07-22 DIAGNOSIS — Z7982 Long term (current) use of aspirin: Secondary | ICD-10-CM

## 2015-07-22 DIAGNOSIS — Z87891 Personal history of nicotine dependence: Secondary | ICD-10-CM | POA: Diagnosis not present

## 2015-07-22 DIAGNOSIS — Z66 Do not resuscitate: Secondary | ICD-10-CM | POA: Diagnosis present

## 2015-07-22 DIAGNOSIS — R634 Abnormal weight loss: Secondary | ICD-10-CM | POA: Diagnosis not present

## 2015-07-22 DIAGNOSIS — R0603 Acute respiratory distress: Secondary | ICD-10-CM

## 2015-07-22 DIAGNOSIS — J441 Chronic obstructive pulmonary disease with (acute) exacerbation: Principal | ICD-10-CM | POA: Diagnosis present

## 2015-07-22 DIAGNOSIS — Z13220 Encounter for screening for lipoid disorders: Secondary | ICD-10-CM | POA: Diagnosis not present

## 2015-07-22 DIAGNOSIS — R0602 Shortness of breath: Secondary | ICD-10-CM | POA: Diagnosis not present

## 2015-07-22 DIAGNOSIS — J449 Chronic obstructive pulmonary disease, unspecified: Secondary | ICD-10-CM

## 2015-07-22 DIAGNOSIS — Z681 Body mass index (BMI) 19 or less, adult: Secondary | ICD-10-CM | POA: Diagnosis not present

## 2015-07-22 DIAGNOSIS — J9601 Acute respiratory failure with hypoxia: Secondary | ICD-10-CM | POA: Diagnosis not present

## 2015-07-22 DIAGNOSIS — Z0000 Encounter for general adult medical examination without abnormal findings: Secondary | ICD-10-CM

## 2015-07-22 DIAGNOSIS — Z23 Encounter for immunization: Secondary | ICD-10-CM | POA: Diagnosis not present

## 2015-07-22 DIAGNOSIS — J9620 Acute and chronic respiratory failure, unspecified whether with hypoxia or hypercapnia: Secondary | ICD-10-CM | POA: Diagnosis present

## 2015-07-22 DIAGNOSIS — R030 Elevated blood-pressure reading, without diagnosis of hypertension: Secondary | ICD-10-CM | POA: Diagnosis not present

## 2015-07-22 DIAGNOSIS — R0600 Dyspnea, unspecified: Secondary | ICD-10-CM | POA: Diagnosis not present

## 2015-07-22 DIAGNOSIS — J9621 Acute and chronic respiratory failure with hypoxia: Secondary | ICD-10-CM | POA: Diagnosis not present

## 2015-07-22 DIAGNOSIS — IMO0001 Reserved for inherently not codable concepts without codable children: Secondary | ICD-10-CM | POA: Diagnosis present

## 2015-07-22 DIAGNOSIS — E43 Unspecified severe protein-calorie malnutrition: Secondary | ICD-10-CM | POA: Diagnosis present

## 2015-07-22 DIAGNOSIS — Z131 Encounter for screening for diabetes mellitus: Secondary | ICD-10-CM

## 2015-07-22 LAB — CBC WITH DIFFERENTIAL/PLATELET
BASOS ABS: 0 10*3/uL (ref 0.0–0.1)
Basophils Absolute: 0 10*3/uL (ref 0.0–0.1)
Basophils Relative: 0 % (ref 0–1)
Basophils Relative: 1 % (ref 0–1)
EOS ABS: 0 10*3/uL (ref 0.0–0.7)
EOS ABS: 0 10*3/uL (ref 0.0–0.7)
EOS PCT: 0 % (ref 0–5)
Eosinophils Relative: 0 % (ref 0–5)
HCT: 46.1 % — ABNORMAL HIGH (ref 36.0–46.0)
HEMATOCRIT: 42.2 % (ref 36.0–46.0)
Hemoglobin: 14.9 g/dL (ref 12.0–15.0)
Hemoglobin: 13.4 g/dL (ref 12.0–15.0)
LYMPHS ABS: 0.9 10*3/uL (ref 0.7–4.0)
Lymphocytes Relative: 13 % (ref 12–46)
Lymphocytes Relative: 26 % (ref 12–46)
Lymphs Abs: 1.8 10*3/uL (ref 0.7–4.0)
MCH: 27.4 pg (ref 26.0–34.0)
MCH: 27.1 pg (ref 26.0–34.0)
MCHC: 32.3 g/dL (ref 30.0–36.0)
MCHC: 31.8 g/dL (ref 30.0–36.0)
MCV: 84.7 fL (ref 78.0–100.0)
MCV: 85.4 fL (ref 78.0–100.0)
MONO ABS: 0.6 10*3/uL (ref 0.1–1.0)
MPV: 9.3 fL (ref 8.6–12.4)
Monocytes Absolute: 0.6 10*3/uL (ref 0.1–1.0)
Monocytes Relative: 9 % (ref 3–12)
Monocytes Relative: 8 % (ref 3–12)
NEUTROS PCT: 65 % (ref 43–77)
Neutro Abs: 5.5 10*3/uL (ref 1.7–7.7)
Neutro Abs: 4.7 10*3/uL (ref 1.7–7.7)
Neutrophils Relative %: 78 % — ABNORMAL HIGH (ref 43–77)
Platelets: 285 10*3/uL (ref 150–400)
Platelets: 231 10*3/uL (ref 150–400)
RBC: 5.44 MIL/uL — AB (ref 3.87–5.11)
RBC: 4.94 MIL/uL (ref 3.87–5.11)
RDW: 14 % (ref 11.5–15.5)
RDW: 13.8 % (ref 11.5–15.5)
WBC: 7.1 10*3/uL (ref 4.0–10.5)
WBC: 7.1 10*3/uL (ref 4.0–10.5)

## 2015-07-22 LAB — I-STAT ARTERIAL BLOOD GAS, ED
Acid-base deficit: 1 mmol/L (ref 0.0–2.0)
Bicarbonate: 26.1 mEq/L — ABNORMAL HIGH (ref 20.0–24.0)
O2 SAT: 95 %
PCO2 ART: 51.5 mmHg — AB (ref 35.0–45.0)
PH ART: 7.312 — AB (ref 7.350–7.450)
PO2 ART: 85 mmHg (ref 80.0–100.0)
Patient temperature: 98.6
TCO2: 28 mmol/L (ref 0–100)

## 2015-07-22 LAB — I-STAT TROPONIN, ED: Troponin i, poc: 0.01 ng/mL (ref 0.00–0.08)

## 2015-07-22 LAB — BASIC METABOLIC PANEL
ANION GAP: 9 (ref 5–15)
BUN: 8 mg/dL (ref 6–20)
CALCIUM: 8.5 mg/dL — AB (ref 8.9–10.3)
CO2: 26 mmol/L (ref 22–32)
CREATININE: 0.6 mg/dL (ref 0.44–1.00)
Chloride: 107 mmol/L (ref 101–111)
GFR calc Af Amer: >60 mL/min (ref >60)
GLUCOSE: 99 mg/dL (ref 65–99)
Potassium: 4.2 mmol/L (ref 3.5–5.1)
Sodium: 142 mmol/L (ref 135–145)

## 2015-07-22 LAB — BRAIN NATRIURETIC PEPTIDE: B NATRIURETIC PEPTIDE 5: 65.9 pg/mL (ref 0.0–100.0)

## 2015-07-22 LAB — TSH: TSH: 1.174 u[IU]/mL (ref 0.350–4.500)

## 2015-07-22 MED ORDER — ALBUTEROL SULFATE (2.5 MG/3ML) 0.083% IN NEBU
INHALATION_SOLUTION | RESPIRATORY_TRACT | Status: AC
  Administered 2015-07-22: 2.5 mg via RESPIRATORY_TRACT
  Filled 2015-07-22: qty 3

## 2015-07-22 MED ORDER — IPRATROPIUM BROMIDE 0.02 % IN SOLN
RESPIRATORY_TRACT | Status: AC
  Administered 2015-07-22: 0.5 mg via RESPIRATORY_TRACT
  Filled 2015-07-22: qty 2.5

## 2015-07-22 MED ORDER — IPRATROPIUM BROMIDE 0.02 % IN SOLN: 0.5000 mg | RESPIRATORY_TRACT | Status: AC

## 2015-07-22 MED ORDER — IPRATROPIUM-ALBUTEROL 0.5-2.5 (3) MG/3ML IN SOLN
3.0000 mL | Freq: Once | RESPIRATORY_TRACT | Status: AC
  Administered 2015-07-22: 3 mL via RESPIRATORY_TRACT

## 2015-07-22 MED ORDER — POLYETHYLENE GLYCOL 3350 17 G PO PACK
17.0000 g | PACK | Freq: Every day | ORAL | Status: DC | PRN
  Administered 2015-07-23: 17 g via ORAL
  Filled 2015-07-22 (×2): qty 1

## 2015-07-22 MED ORDER — METHYLPREDNISOLONE SODIUM SUCC 125 MG IJ SOLR
125.0000 mg | Freq: Once | INTRAMUSCULAR | Status: AC
  Administered 2015-07-22: 125 mg via INTRAVENOUS
  Filled 2015-07-22: qty 2

## 2015-07-22 MED ORDER — CETYLPYRIDINIUM CHLORIDE 0.05 % MT LIQD
7.0000 mL | Freq: Two times a day (BID) | OROMUCOSAL | Status: DC
  Administered 2015-07-23 – 2015-07-25 (×4): 7 mL via OROMUCOSAL

## 2015-07-22 MED ORDER — LEVALBUTEROL HCL 0.63 MG/3ML IN NEBU
0.6300 mg | INHALATION_SOLUTION | Freq: Four times a day (QID) | RESPIRATORY_TRACT | Status: DC
  Administered 2015-07-22 – 2015-07-25 (×12): 0.63 mg via RESPIRATORY_TRACT
  Filled 2015-07-22 (×25): qty 3

## 2015-07-22 MED ORDER — ACLIDINIUM BROMIDE 400 MCG/ACT IN AEPB: 1.0000 | INHALATION_SPRAY | Freq: Two times a day (BID) | RESPIRATORY_TRACT | Status: DC

## 2015-07-22 MED ORDER — ALBUTEROL SULFATE (2.5 MG/3ML) 0.083% IN NEBU: 5.0000 mg | INHALATION_SOLUTION | Freq: Once | RESPIRATORY_TRACT | Status: AC

## 2015-07-22 MED ORDER — SODIUM CHLORIDE 0.9 % IJ SOLN
3.0000 mL | Freq: Two times a day (BID) | INTRAMUSCULAR | Status: DC
  Administered 2015-07-22 – 2015-07-25 (×6): 3 mL via INTRAVENOUS

## 2015-07-22 MED ORDER — ALBUTEROL SULFATE (2.5 MG/3ML) 0.083% IN NEBU
INHALATION_SOLUTION | RESPIRATORY_TRACT | Status: AC
  Administered 2015-07-22: 2.5 mg via RESPIRATORY_TRACT
  Filled 2015-07-22: qty 6

## 2015-07-22 MED ORDER — ACETAMINOPHEN 650 MG RE SUPP: 650.0000 mg | Freq: Four times a day (QID) | RECTAL | Status: DC | PRN

## 2015-07-22 MED ORDER — BUDESONIDE-FORMOTEROL FUMARATE 160-4.5 MCG/ACT IN AERO: 2.0000 | INHALATION_SPRAY | Freq: Two times a day (BID) | RESPIRATORY_TRACT | Status: DC

## 2015-07-22 MED ORDER — HYDRALAZINE HCL 10 MG PO TABS
10.0000 mg | ORAL_TABLET | Freq: Four times a day (QID) | ORAL | Status: DC | PRN
  Filled 2015-07-22: qty 1

## 2015-07-22 MED ORDER — IPRATROPIUM BROMIDE 0.02 % IN SOLN
0.5000 mg | Freq: Once | RESPIRATORY_TRACT | Status: DC
  Administered 2015-07-22: 0.5 mg via RESPIRATORY_TRACT

## 2015-07-22 MED ORDER — SODIUM CHLORIDE 0.9 % IV SOLN
1000.0000 mL | Freq: Once | INTRAVENOUS | Status: AC
  Administered 2015-07-22: 1000 mL via INTRAVENOUS

## 2015-07-22 MED ORDER — ACETAMINOPHEN 325 MG PO TABS: 650.0000 mg | ORAL_TABLET | Freq: Four times a day (QID) | ORAL | Status: DC | PRN

## 2015-07-22 MED ORDER — LEVALBUTEROL HCL 0.63 MG/3ML IN NEBU
0.6300 mg | INHALATION_SOLUTION | Freq: Once | RESPIRATORY_TRACT | Status: AC
  Administered 2015-07-22: 0.63 mg via RESPIRATORY_TRACT

## 2015-07-22 MED ORDER — ALBUTEROL SULFATE (2.5 MG/3ML) 0.083% IN NEBU
5.0000 mg | INHALATION_SOLUTION | Freq: Once | RESPIRATORY_TRACT | Status: AC
  Administered 2015-07-22 (×2): 2.5 mg via RESPIRATORY_TRACT

## 2015-07-22 MED ORDER — LEVALBUTEROL TARTRATE 45 MCG/ACT IN AERO: 1.0000 | INHALATION_SPRAY | RESPIRATORY_TRACT | Status: DC | PRN

## 2015-07-22 MED ORDER — ONDANSETRON HCL 4 MG/2ML IJ SOLN: 4.0000 mg | Freq: Four times a day (QID) | INTRAMUSCULAR | Status: DC | PRN

## 2015-07-22 MED ORDER — ENOXAPARIN SODIUM 30 MG/0.3ML ~~LOC~~ SOLN
20.0000 mg | SUBCUTANEOUS | Status: DC
  Administered 2015-07-23 – 2015-07-25 (×2): 20 mg via SUBCUTANEOUS
  Filled 2015-07-22 (×3): qty 0.2

## 2015-07-22 MED ORDER — POLYETHYLENE GLYCOL 3350 17 GM/SCOOP PO POWD: 17.0000 g | Freq: Every day | ORAL | Status: DC

## 2015-07-22 MED ORDER — METHYLPREDNISOLONE SODIUM SUCC 40 MG IJ SOLR
40.0000 mg | Freq: Two times a day (BID) | INTRAMUSCULAR | Status: DC
  Administered 2015-07-23 – 2015-07-24 (×3): 40 mg via INTRAVENOUS
  Filled 2015-07-22 (×5): qty 1

## 2015-07-22 MED ORDER — ALBUTEROL SULFATE (2.5 MG/3ML) 0.083% IN NEBU
5.0000 mg | INHALATION_SOLUTION | Freq: Once | RESPIRATORY_TRACT | Status: AC
  Administered 2015-07-22: 5 mg via RESPIRATORY_TRACT

## 2015-07-22 MED ORDER — MIRTAZAPINE 7.5 MG PO TABS: 7.5000 mg | ORAL_TABLET | Freq: Every day | ORAL | Status: DC

## 2015-07-22 MED ORDER — ONDANSETRON HCL 4 MG PO TABS: 4.0000 mg | ORAL_TABLET | Freq: Four times a day (QID) | ORAL | Status: DC | PRN

## 2015-07-22 MED ORDER — ALBUTEROL SULFATE (2.5 MG/3ML) 0.083% IN NEBU
INHALATION_SOLUTION | RESPIRATORY_TRACT | Status: AC
  Filled 2015-07-22: qty 6

## 2015-07-22 NOTE — ED Provider Notes (Signed)
CSN: [id number]     Arrival date & time 07/22/15  1545 History   First MD Initiated Contact with Patient 07/22/15 1610     Chief Complaint  Patient presents with  . Shortness of Breath     (Consider location/radiation/quality/duration/timing/severity/associated sxs/prior Treatment) The history is provided by the patient and a relative. The history is limited by the condition of the patient. No language interpreter was used.     Patient is a 74 year old female with pertinent medical history of COPD, former smoker, recent weight loss, who presented to the emergency department with respiratory distress and hypoxia noted at a PCP visit, oxygen saturations documented 70%, and upon presentation to the ER was 84%.  The patient was given breathing treatments at the doctor's office and then sent to the ER via family members.  The patient has had frequent shortness of breath episodes that typically occur at night and last less than 7 minutes. At her baseline the patient is able to inflate around her home without difficulty and do some housekeeping however, her daughters at the bedside state that she "paces herself". She has been and able to run errands or go to the grocery store for some time. Over the past 2 days she has had exertional shortness of breath with minimal walking distances. This morning she had several prolonged episodes of acute shortness of breath, with increased work of breathing.  She denies cough, chest pain, lightheadedness, syncope, palpitations, lower extremity edema.  She also denies any recent upper respiratory infection symptoms, denies allergies, has not had any runny nose, sore throat, ear pain, fever, sweats or productive cough. She is alert and has not had any confusion or disorientation. She is a patient of Dr. [name], pulmonologist, who has her on multiple maintenance medications for her COPD. She has never had home O2 before.  She quit smoking in 2004 and has had significant weight  loss over the past several years, as of 2013 she has lost over 40 pounds.     Past Medical History  Diagnosis Date  . Other diseases of lung, not elsewhere classified   . Allergic rhinitis, cause unspecified   . Shortness of breath   . Personal history of tobacco use, presenting hazards to health   . Elevated blood pressure reading without diagnosis of hypertension   . Chronic airway obstruction, not elsewhere classified   . Other (abnormal) findings on radiological examination of breast    Past Surgical History  Procedure Laterality Date  . Breast surgery Left     4-5 YEARS AGO  . Tubal ligation  1969   Family History  Problem Relation Age of Onset  . Lung disease Sister   . Hypothyroidism Mother   . Aortic stenosis Mother   . Dementia Mother   . COPD Sister     smoker  . Heart disease Father   . Mesothelioma Father     asbestos exp  . Cancer Father     LUNG   History  Substance Use Topics  . Smoking status: Former Smoker -- 1.00 packs/day for 30 years    Types: Cigarettes    Quit date: 12/19/2002  . Smokeless tobacco: Not on file  . Alcohol Use: No   OB History    No data available     Review of Systems  Constitutional: Negative for fever, chills, diaphoresis and appetite change.  HENT: Negative.   Eyes: Negative.   Respiratory: Negative for apnea, cough, choking, chest tightness, wheezing and stridor.  Cardiovascular: Negative.   Gastrointestinal: Negative.   Endocrine: Negative.   Genitourinary: Negative.   Musculoskeletal: Negative.   Skin: Negative.   Neurological: Negative.   Hematological: Negative.   Psychiatric/Behavioral: Negative.       Allergies  Review of patient's allergies indicates no known allergies.  Home Medications   Prior to Admission medications   Medication Sig Start Date End Date Taking? Authorizing Provider  Aclidinium Bromide (TUDORZA PRESSAIR) 400 MCG/ACT AEPB Inhale 1 puff into the lungs 2 (two) times daily. One twice  daily 07/22/15  Yes [name], MD  aspirin 81 MG chewable tablet Chew 81 mg by mouth daily.   Yes Historical Provider, MD  budesonide-formoterol (SYMBICORT) 160-4.5 MCG/ACT inhaler Inhale 2 puffs into the lungs 2 (two) times daily. 07/22/15  Yes [name], MD  levalbuterol [facility] HFA) 45 MCG/ACT inhaler Inhale 1-2 puffs into the lungs every 4 (four) hours as needed for wheezing. 07/22/15  Yes [name], MD  mirtazapine (REMERON) 7.5 MG tablet Take 1 tablet (7.5 mg total) by mouth at bedtime. Patient not taking: Reported on 07/22/2015 07/22/15   [name], MD  polyethylene glycol powder (GLYCOLAX/MIRALAX) powder Take 17 g by mouth daily. Patient not taking: Reported on 07/22/2015 07/22/15   [name], MD  SYMBICORT 160-4.5 MCG/ACT inhaler INHALE 2 PUFFS BY MOUTH INTO THE LUNGS TWICE DAILY Patient not taking: Reported on 07/22/2015 05/14/15   Nyoka Cowden, MD  XOPENEX HFA 45 MCG/ACT inhaler INHALE 1 TO 2 PUFFS BY MOUTH INTO THE LUNGS EVERY 4 HOURS AS NEEDED FOR WHEEZING Patient not taking: Reported on 07/22/2015 07/10/15   Nyoka Cowden, MD   BP 151/67 mmHg  Pulse 75  Temp(Src) 97.8 F (36.6 C) (Oral)  Resp 19  SpO2 91% Physical Exam  Constitutional: She is oriented to person, place, and time. She appears well-developed. She appears cachectic. She is cooperative. She appears distressed.  Cachectic elderly female, appears distressed, chronically ill  HENT:  Head: Normocephalic and atraumatic.  Nose: Nose normal.  Mouth/Throat: Uvula is midline and oropharynx is clear and moist. Mucous membranes are not pale, not dry and not cyanotic. No oropharyngeal exudate.  Eyes: Conjunctivae and EOM are normal. Pupils are equal, round, and reactive to light. Right eye exhibits no discharge. Left eye exhibits no discharge. No scleral icterus.  Neck: Normal range of motion. No JVD present. No tracheal deviation present. No thyromegaly present.  Cardiovascular: Regular rhythm, normal heart sounds  and intact distal pulses.  Tachycardia present.  PMI is not displaced.  Exam reveals no gallop and no friction rub.   No murmur heard. Normal capillary refill  Pulmonary/Chest: Accessory muscle usage present. Tachypnea noted. She is in respiratory distress. She has decreased breath sounds in the right upper field, the right middle field, the right lower field, the left upper field, the left middle field and the left lower field. She has wheezes in the right lower field. She has no rhonchi. She has no rales. She exhibits retraction. She exhibits no tenderness.  Speaking in short phrases, retractions of the rib and suprasternal notch and supraclavicular  Abdominal: Soft. Bowel sounds are normal. She exhibits no distension and no mass. There is no tenderness. There is no rebound and no guarding.  Musculoskeletal: Normal range of motion. She exhibits no edema or tenderness.  Lymphadenopathy:    She has no cervical adenopathy.  Neurological: She is alert and oriented to person, place, and time. She has normal reflexes. No  cranial nerve deficit. She exhibits normal muscle tone. Coordination normal.  Skin: Skin is warm and dry. No rash noted. She is not diaphoretic. No erythema. No pallor.  Psychiatric: She has a normal mood and affect. Her behavior is normal. Judgment and thought content normal.  Nursing note and vitals reviewed.   ED Course  Procedures (including critical care time) Labs Review Labs Reviewed  BASIC METABOLIC PANEL - Abnormal; Notable for the following:    Calcium 8.5 (*)    All other components within normal limits  I-STAT ARTERIAL BLOOD GAS, ED - Abnormal; Notable for the following:    pH, Arterial 7.312 (*)    pCO2 arterial 51.5 (*)    Bicarbonate 26.1 (*)    All other components within normal limits  CBC WITH DIFFERENTIAL/PLATELET  BLOOD GAS, ARTERIAL  I-STAT TROPOININ, ED    Imaging Review Dg Chest Port 1 View  07/22/2015   CLINICAL DATA:  Shortness of breath  beginning this morning, history COPD, former smoker  EXAM: PORTABLE CHEST - 1 VIEW  COMPARISON:  Portable exam 1620 hours compared to 10/21/2014  FINDINGS: Normal heart size and mediastinal contours.  Enlarged central pulmonary arteries question pulmonary arterial hypertension.  Emphysematous and bronchitic changes consistent with COPD.  No acute infiltrate, pleural effusion, or pneumothorax.  Mild scattered bullous disease.  No acute osseous findings.  IMPRESSION: COPD changes.  Question pulmonary arterial hypertension.  No acute abnormalities.   Electronically Signed   By: Ulyses Southward M.D.   On: 07/22/2015 16:31     EKG Interpretation   Date/Time:  Wednesday July 22 2015 15:49:14 EDT Ventricular Rate:  97 PR Interval:  114 QRS Duration: 74 QT Interval:  334 QTC Calculation: 424 R Axis:   111 Text Interpretation:  Normal sinus rhythm with sinus arrhythmia Right  atrial enlargement Right axis deviation Pulmonary disease pattern  Nonspecific ST and T wave abnormality Abnormal ECG No previous ECGs  available Confirmed by KNOTT MD, Reuel Boom ([id number]) on 07/22/2015 4:29:11 PM      MDM   Final diagnoses:  None    Patient with acute respiratory distress - COPD exacerbation, moving very little air - acute on chronic respiratory failure, given back to back duonebs, not maintaining sats on room air, currently at 4L  - NEW oxygen demand.  Upon reexam patient still has faint lung sounds, x-ray wheeze heard in the right lower base, continues to purse her lips with expiration and has diffuse retractions.  She does have intermittent periods where she is more comfortable and less tachycardic and appears less distressed.  She is still not able to speak in full sentences without difficulty. Overall labs were unremarkable, chest x-ray positive for COPD changes and questionable pulmonary hypertension. EKG reveals pulmonary disease pattern with nonspecific ST and T-wave abnormalities.   Hospitalist was  called for admission- I spoke with Dr. Rito Ehrlich, who requested a stat ABG to further evaluate the patient for placement in stepdown versus ICU. Pt was admitted with Dr. Littie Deeds, PA-C 08/07/15 0245  Lyndal Pulley, MD 08/10/15 1328

## 2015-07-22 NOTE — ED Notes (Addendum)
Pt desat to 76% on RA. Pt using accessory muscles while breathing and appears to be in distress. Pt placed on 4 L [place] with no improvement. Pt then placed on NR and resp was paged. Dr. Hinda Glatter and Sheliah Mends informed of pt condition.

## 2015-07-22 NOTE — Progress Notes (Signed)
Pt changed her mind about being a DNR. Order changed to FULL CODEHortencia Conradi, NP [facility]

## 2015-07-22 NOTE — H&P (Signed)
History and Physical  Anita Murphy:[id number] DOB: 1941-02-17 DOA: 07/22/2015  Referring physician: Danelle Berry, ER PA PCP: Nilda Simmer, MD   Chief Complaint: Shortness of breath  HPI: Anita Murphy is a 74 y.o. female  With past medical history of COPD not on oxygen at home, followed by pulmonary who in the last month had medication changes. Patient states for the last week or so, she been noticing increased shortness of breath. She had a coughing spell a week prior, but nothing since. No fevers. No chest pain. She felt like her breathing was getting worse and today she could not catch her breath she came into the emergency room. Patient initially noted in the emergency room to be quite tachypneic with poor air movement and some wheezing. She was given steroids plus nebulizers initially high-dose oxygen eventually weaned down to 4 L. At that point she was satting 100%. Hospital is call for further evaluation and I requested a blood gas. Blood gas noted some acidosis, however her PCO2 level only mildly elevated and this looked to be more chronic in nature. Hospitalist then to admit for further evaluation   Review of Systems:  Patient seen in the emergency room. Pt complains of some shortness of breath, although much better than earlier. She does have problems with chronic constipation  Pt denies any headaches, vision changes, chest pain, palpitations, wheeze, cough, abdominal pain, hematuria, dysuria, diarrhea, focal extremity numbness weakness or pain.  Review of systems are otherwise negative  Past Medical History  Diagnosis Date  . Other diseases of lung, not elsewhere classified   . Allergic rhinitis, cause unspecified   . Shortness of breath   . Personal history of tobacco use, presenting hazards to health   . Elevated blood pressure reading without diagnosis of hypertension   . Chronic airway obstruction, not elsewhere classified   . Other (abnormal) findings on radiological  examination of breast    Past Surgical History  Procedure Laterality Date  . Breast surgery Left     4-5 YEARS AGO  . Tubal ligation  1969   Social History:  reports that she quit smoking about 12 years ago. Her smoking use included Cigarettes. She has a 30 pack-year smoking history. She does not have any smokeless tobacco history on file. She reports that she does not drink alcohol or use illicit drugs. Patient lives at home with one of her daughters & is able to participate in activities of daily living with out use of a cane or walker, but her overall physical activity is limited because of shortness of breath  No Known Allergies  Family History  Problem Relation Age of Onset  . Lung disease Sister   . Hypothyroidism Mother   . Aortic stenosis Mother   . Dementia Mother   . COPD Sister     smoker  . Heart disease Father   . Mesothelioma Father     asbestos exp  . Cancer Father     LUNG      Prior to Admission medications   Medication Sig Start Date End Date Taking? Authorizing Provider  Aclidinium Bromide (TUDORZA PRESSAIR) 400 MCG/ACT AEPB Inhale 1 puff into the lungs 2 (two) times daily. One twice daily 07/22/15  Yes [name], MD  aspirin 81 MG chewable tablet Chew 81 mg by mouth daily.   Yes Historical Provider, MD  budesonide-formoterol (SYMBICORT) 160-4.5 MCG/ACT inhaler Inhale 2 puffs into the lungs 2 (two) times daily. 07/22/15  Yes [name], MD  levalbuterol (XOPENEX HFA) 45 MCG/ACT inhaler Inhale 1-2 puffs into the lungs every 4 (four) hours as needed for wheezing. 07/22/15  Yes [name], MD    Physical Exam: BP 126/87 mmHg  Pulse 104  Temp(Src) 97.8 F (36.6 C) (Oral)  Resp 25  SpO2 93%  General:  Alert and oriented 3, dyspneic with continued talking Eyes: Sclera nonicteric, extraocular movements are intact ENT: Normocephalic, atraumatic, mucous membranes are slightly dry Neck: No carotid bruits Cardiovascular: Regular rhythm, occasional  ectopic beat, borderline tachycardia Respiratory: Decreased breath sounds throughout, mild prolonged expiratory phase Abdomen: Soft, nontender, nondistended, positive bowel sounds Skin: No skin breaks, tears or lesions Musculoskeletal: No clubbing or cyanosis or edema Psychiatric: Patient is appropriate, no evidence of psychoses Neurologic: No focal deficits           Labs on Admission:  Basic Metabolic Panel:  Recent Labs Lab 07/22/15 1705  NA 142  K 4.2  CL 107  CO2 26  GLUCOSE 99  BUN 8  CREATININE 0.60  CALCIUM 8.5*   Liver Function Tests: No results for input(s): AST, ALT, ALKPHOS, BILITOT, PROT, ALBUMIN in the last 168 hours. No results for input(s): LIPASE, AMYLASE in the last 168 hours. No results for input(s): AMMONIA in the last 168 hours. CBC:  Recent Labs Lab 07/22/15 1705  WBC 7.1  NEUTROABS 4.7  HGB 13.4  HCT 42.2  MCV 85.4  PLT 231   Cardiac Enzymes: No results for input(s): CKTOTAL, CKMB, CKMBINDEX, TROPONINI in the last 168 hours.  BNP (last 3 results) No results for input(s): BNP in the last 8760 hours.  ProBNP (last 3 results) No results for input(s): PROBNP in the last 8760 hours.  CBG: No results for input(s): GLUCAP in the last 168 hours.  Radiological Exams on Admission: Dg Chest Port 1 View  07/22/2015   CLINICAL DATA:  Shortness of breath beginning this morning, history COPD, former smoker  EXAM: PORTABLE CHEST - 1 VIEW  COMPARISON:  Portable exam 1620 hours compared to 10/21/2014  FINDINGS: Normal heart size and mediastinal contours.  Enlarged central pulmonary arteries question pulmonary arterial hypertension.  Emphysematous and bronchitic changes consistent with COPD.  No acute infiltrate, pleural effusion, or pneumothorax.  Mild scattered bullous disease.  No acute osseous findings.  IMPRESSION: COPD changes.  Question pulmonary arterial hypertension.  No acute abnormalities.   Electronically Signed   By: Ulyses Southward M.D.   On:  07/22/2015 16:31    EKG: Independently reviewed. Right axis deviation enlargement. Pulmonary disease pattern noted. Nonspecific ST and T wave abnormalities.  Assessment/Plan Present on Admission:  . Acute on chronic respiratory failure causing COPD exacerbation: Patient looks to be much improved than when she first came in: Minimal wheezing and much better airflow normal was originally reported. He seems to be having decent oxygen saturations now on 2 L nasal cannula. We'll continue supplemental oxygen trying to keep oxygen saturations no higher than 94% for fear of hypercarbic retention. IV steroids, although with minimal wheezing, can likely change these over to by mouth by mouth. Scheduled and when necessary nebulizers. No antibiotics at this time. No signs of any infection   . Severe protein-calorie malnutrition: Patient looks to be quite emaciated. We'll consult nutrition.  . Elevated blood pressure: No previous history of hypertension. When necessary hydralazine and we'll check BNP to rule out heart failure from cor pulmonale  Consultants: Nutrition  Code Status: DO NOT RESUSCITATE as confirmed by patient  Family Communication: Daughter at the  bedside   Disposition Plan: Likely here for the next few days, discharge once back to baseline off of oxygen  Time spent: 45 minutes  Hollice Espy [facility] Pager [phone]

## 2015-07-22 NOTE — ED Notes (Signed)
phelbotomy at bedside.  

## 2015-07-22 NOTE — ED Notes (Signed)
PA at bedside.

## 2015-07-22 NOTE — ED Notes (Signed)
Pt sent here from doctor with SOB and low sats. Pt 84% in triage.

## 2015-07-22 NOTE — ED Provider Notes (Addendum)
Medical screening examination/treatment/procedure(s) were conducted as a shared visit with non-physician practitioner(s) and myself.  I personally evaluated the patient during the encounter.  74 year old female presents with apparent COPD exacerbation and desaturation into the 70s prior to and on arrival worsening over the last 48 hours. Faint end expiratory wheezing with poor air movement on arrival. Plan for back-to-back nebs, steroids and monitoring of condition with likely admission.     EKG Interpretation   Date/Time:  Wednesday July 22 2015 15:49:14 EDT Ventricular Rate:  97 PR Interval:  114 QRS Duration: 74 QT Interval:  334 QTC Calculation: 424 R Axis:   111 Text Interpretation:  Normal sinus rhythm with sinus arrhythmia Right  atrial enlargement Right axis deviation Pulmonary disease pattern  Nonspecific ST and T wave abnormality Abnormal ECG No previous ECGs  available Confirmed by Sharilyn Geisinger MD, Reuel Boom ([id number]) on 07/22/2015 4:29:11 PM      CRITICAL CARE Performed by: Lyndal Pulley Total critical care time: 30 Critical care time was exclusive of separately billable procedures and treating other patients. Critical care was necessary to treat or prevent imminent or life-threatening deterioration. Critical care was time spent personally by me on the following activities: development of treatment plan with patient and/or surrogate as well as nursing, discussions with consultants, evaluation of patient's response to treatment, examination of patient, obtaining history from patient or surrogate, ordering and performing treatments and interventions, ordering and review of laboratory studies, ordering and review of radiographic studies, pulse oximetry and re-evaluation of patient's condition.    Lyndal Pulley, MD 07/22/15 2131

## 2015-07-22 NOTE — Progress Notes (Signed)
Subjective:    Patient ID: Anita Murphy, female    DOB: Apr 29, 1941, 74 y.o.   MRN: [id number]  07/22/2015  Annual Exam and Medication Refill   HPI This 74 y.o. female presents with two daughters for Annual Wellness Examination.  Last physical:  12-05-2012 Pap smear:  12-05-2012 Mammogram:  10-19-2011 Colonoscopy:  Refused; hemosure negative 2013. Bone density:  05-22-2012 TDAP:unsure Pneumovax:  2012 Zostavax: never Influenza:  2014   COPD III: followed closely by Dr. Melvyn Novas.  Quit smoking 2004.  Spiriva and Symbicort.  No longer driving; quit driving since May. Pt was coming to [place] frequently.  Gasping for air.  Any exertion makes breathing worse.  Acutely worsened this morning; also every morning for the past week. Sleeping well at night.  If gets up too fast, will suffer with DOE.  Seeing Dr. Melvyn Novas every three months; overdue for appointment.  Eating well.  Losing weight.  Drinking Kale, cupcakes.  Likes ice cream.  Was drinking Ensure but stopped last year.  Symbicort, Tudorza, Xopenex.  Using Xopenex three times a day.   Acute SOB:  Denies fever/chills/sweats.  Denies headache; denies ear pain or sore throat. +rhinorrhea with respiratory distress today. No n/v/d.  No cough.  Acute respiratory distress this morning seven minutes acutely and then improved some but not completely.  Current episode 2.5 hours. These episodes occurred three times last week.  Usually occurs at night before bedtime.  Constipation: having horrible constipation.  No previous colonoscopy; has always refused.  Onset intermittent.  No bloody stools; no melena.  Weight loss; Dr. Melvyn Novas recommended checking thyroid at Annual Examination.  Quit drinking Ensure a year ago until death of husband.  Husband died of lung cancer.  Review of Systems  Constitutional: Positive for unexpected weight change. Negative for fever, chills, diaphoresis, activity change, appetite change and fatigue.  HENT: Positive for rhinorrhea.  Negative for congestion, dental problem, drooling, ear discharge, ear pain, facial swelling, hearing loss, mouth sores, nosebleeds, postnasal drip, sinus pressure, sneezing, sore throat, tinnitus, trouble swallowing and voice change.   Eyes: Negative for photophobia, pain, discharge, redness, itching and visual disturbance.  Respiratory: Positive for shortness of breath, wheezing and stridor. Negative for apnea, cough, choking and chest tightness.   Cardiovascular: Negative for chest pain, palpitations and leg swelling.  Gastrointestinal: Positive for constipation. Negative for nausea, vomiting, abdominal pain, diarrhea, blood in stool, abdominal distention, anal bleeding and rectal pain.  Endocrine: Negative for cold intolerance, heat intolerance, polydipsia, polyphagia and polyuria.  Genitourinary: Negative for dysuria, urgency, frequency, hematuria, flank pain, decreased urine volume, vaginal bleeding, vaginal discharge, enuresis, difficulty urinating, genital sores, vaginal pain, menstrual problem, pelvic pain and dyspareunia.  Musculoskeletal: Negative for myalgias, back pain, joint swelling, arthralgias, gait problem, neck pain and neck stiffness.  Skin: Negative for color change, pallor, rash and wound.  Allergic/Immunologic: Negative for environmental allergies, food allergies and immunocompromised state.  Neurological: Negative for dizziness, tremors, seizures, syncope, facial asymmetry, speech difficulty, weakness, light-headedness, numbness and headaches.  Hematological: Negative for adenopathy. Does not bruise/bleed easily.  Psychiatric/Behavioral: Negative for suicidal ideas, hallucinations, behavioral problems, confusion, sleep disturbance, self-injury, dysphoric mood, decreased concentration and agitation. The patient is not nervous/anxious and is not hyperactive.     Past Medical History  Diagnosis Date  . Other diseases of lung, not elsewhere classified   . Allergic rhinitis, cause  unspecified   . Shortness of breath   . Personal history of tobacco use, presenting hazards to health   . Elevated  blood pressure reading without diagnosis of hypertension   . Chronic airway obstruction, not elsewhere classified   . Other (abnormal) findings on radiological examination of breast    Past Surgical History  Procedure Laterality Date  . Breast surgery Left     4-5 YEARS AGO  . Tubal ligation  1969   No Known Allergies Current Outpatient Prescriptions  Medication Sig Dispense Refill  . Aclidinium Bromide (TUDORZA PRESSAIR) 400 MCG/ACT AEPB Inhale 1 puff into the lungs 2 (two) times daily. One twice daily 1 each 3  . aspirin 81 MG tablet Take 81 mg by mouth daily.    . budesonide-formoterol (SYMBICORT) 160-4.5 MCG/ACT inhaler Inhale 2 puffs into the lungs 2 (two) times daily. 10.2 g 3  . levalbuterol (XOPENEX HFA) 45 MCG/ACT inhaler Inhale 1-2 puffs into the lungs every 4 (four) hours as needed for wheezing. 1 Inhaler 3  . SYMBICORT 160-4.5 MCG/ACT inhaler INHALE 2 PUFFS BY MOUTH INTO THE LUNGS TWICE DAILY 10.2 g 3  . XOPENEX HFA 45 MCG/ACT inhaler INHALE 1 TO 2 PUFFS BY MOUTH INTO THE LUNGS EVERY 4 HOURS AS NEEDED FOR WHEEZING 15 g 0   No current facility-administered medications for this visit.   History   Social History  . Marital Status: Married    Spouse Name: N/A  . Number of Children: 4  . Years of Education: 12   Occupational History  . 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 Smoking status: Former Smoker -- 1.00 packs/day for 30 years    Types: Cigarettes    Quit date: 12/19/2002  . Smokeless tobacco: Not on file  . Alcohol Use: No  . Drug Use: No  . Sexual Activity: No   Other Topics Concern  . Not on file   Social History Narrative   Always uses seat belts. Smoke alarm and carbon monoxide detector in the home. Guns in the home stored in locked cabinet.       Marital status:  Widowed; married x 52 yrs, happy, no  abuse.  Husband with lung cancer and colon cancer.      Lives with one daughter, brother, and grandson.       Exercise: none       Caffeine use: Coffee 2 servings per day.      Employment: retired      ADLs: independent with all ADLs.  No assistant devices for ambulation.  Washes clothes, cooks.        Advanced Directives:     Family History  Problem Relation Age of Onset  . Lung disease Sister   . Hypothyroidism Mother   . Aortic stenosis Mother   . Dementia Mother   . COPD Sister     smoker  . Heart disease Father   . Mesothelioma Father     asbestos exp  . Cancer Father     LUNG       Objective:    BP 100/66 mmHg  Pulse 60  Temp(Src) 97.7 F (36.5 C) (Oral)  Resp 20  Ht 5' 2.25" (1.581 m)  Wt 78 lb 3.2 oz (35.471 kg)  BMI 14.19 kg/m2  SpO2 70% Physical Exam  Constitutional: She is oriented to person, place, and time. She appears well-developed and well-nourished. She appears distressed.  HENT:  Head: Normocephalic and atraumatic.  Right Ear: External ear normal.  Left Ear: External ear normal.  Nose: Nose normal.  Mouth/Throat: Oropharynx is  clear and moist.  Eyes: Conjunctivae and EOM are normal. Pupils are equal, round, and reactive to light.  Neck: Normal range of motion and full passive range of motion without pain. Neck supple. No JVD present. Carotid bruit is not present. No thyromegaly present.  Cardiovascular: Normal rate, regular rhythm, normal heart sounds and intact distal pulses.  Exam reveals no gallop and no friction rub.   No murmur heard. Pulmonary/Chest: Breath sounds normal. Tachypnea noted. She is in respiratory distress. She has no wheezes. She has no rales.  Speaking in short choppy sentences.  Abdominal: Soft. Bowel sounds are normal. She exhibits no distension and no mass. There is no tenderness. There is no rebound and no guarding.  Musculoskeletal:       Right shoulder: Normal.       Left shoulder: Normal.       Cervical back: Normal.    Lymphadenopathy:    She has no cervical adenopathy.  Neurological: She is alert and oriented to person, place, and time. She has normal reflexes. No cranial nerve deficit. She exhibits normal muscle tone. Coordination normal.  Skin: Skin is warm and dry. No rash noted. She is not diaphoretic. No erythema. No pallor.  Psychiatric: She has a normal mood and affect. Her behavior is normal. Judgment and thought content normal.  Nursing note and vitals reviewed.   UPON ARRIVAL TO CLINIC, RESPIRATORY DISTRESS NOTED BY CMA STAFF;OXYGEN PLACED AT 4 LITERS NASAL CANNULA UPON ARRIVAL FOR PULSE OXIMETRY 70%.  TACHYPNEA AT 36. PULSE OXIMETRY IMPROVED TO 96% ON 4 LITERS NASAL CANNULA.  EKG: sinus tachy  XOPENEX NEBULIZER ADMINISTERED IN OFFICE with improvement in tachypnea.     Assessment & Plan:   1. Medicare annual wellness visit, subsequent   2. Screening, lipid   3. Screening for diabetes mellitus   4. COPD III   5. Shortness of breath   6. Weight loss, unintentional   7. Need for prophylactic vaccination against Streptococcus pneumoniae (pneumococcus)    Orders Placed This Encounter  Procedures  . Pneumococcal conjugate vaccine 13-valent IM  . CBC with Differential/Platelet  . Comprehensive metabolic panel  . TSH  . EKG 12-Lead   1. Annual Wellness Examination: anticipatory guidance --- weight gain, improved compliance with medical follow-up. No longer warrants pap smears due to age.  Refer for mammogram at next visit. S/p Pnevnar 13.  Refuses colonoscopy.  Moderate fall risk due to COPD.  Needing assistance with ADLs due to DOE/COPD.  No advanced directives but desires DNR/DNI.  No evidence of depression. 2.  Acute respiratory failure with hypoxia: New.  Pulse oximetry of 70% upon arrival to clinic; responded well with oxygen supplementation and Duoneb in office; to ED for admission and management.   3.  COPD III: with acute decompensation; s/p DUONEB In office; oxygen supplementation  provided during visit.  To ED. 4.  Weight loss unintentional: obtain labs including TSH, ESR.  Likely due to advanced COPD/emphysema requiring additional caloric need; however, delinquent on cancer screenings such as mammogram and colonoscopy; thus, cannot rule out underlying malignancy.  Husband also passed away in the past year, so underlying depression versus grieving process may be contributing as well.  Encourage Ensure high protein supplement 1-3 per day in addition to meals. 5.  Screening lipid: obtain FLP. 6. Screening DMII: obtain glucose.   Meds ordered this encounter  Medications  . Aclidinium Bromide (TUDORZA PRESSAIR) 400 MCG/ACT AEPB    Sig: Inhale 1 puff into the lungs 2 (two) times  daily. One twice daily    Dispense:  1 each    Refill:  3  . budesonide-formoterol (SYMBICORT) 160-4.5 MCG/ACT inhaler    Sig: Inhale 2 puffs into the lungs 2 (two) times daily.    Dispense:  10.2 g    Refill:  3  . levalbuterol (XOPENEX HFA) 45 MCG/ACT inhaler    Sig: Inhale 1-2 puffs into the lungs every 4 (four) hours as needed for wheezing.    Dispense:  1 Inhaler    Refill:  3    No Follow-up on file.    Kristi Elayne Guerin, M.D. [facility] [address] [place], [place]  [address] [phone] phone [phone] fax

## 2015-07-23 DIAGNOSIS — J9621 Acute and chronic respiratory failure with hypoxia: Secondary | ICD-10-CM | POA: Diagnosis not present

## 2015-07-23 DIAGNOSIS — J441 Chronic obstructive pulmonary disease with (acute) exacerbation: Principal | ICD-10-CM

## 2015-07-23 DIAGNOSIS — Z66 Do not resuscitate: Secondary | ICD-10-CM | POA: Diagnosis present

## 2015-07-23 DIAGNOSIS — Z681 Body mass index (BMI) 19 or less, adult: Secondary | ICD-10-CM | POA: Diagnosis not present

## 2015-07-23 DIAGNOSIS — R030 Elevated blood-pressure reading, without diagnosis of hypertension: Secondary | ICD-10-CM | POA: Diagnosis not present

## 2015-07-23 DIAGNOSIS — Z7982 Long term (current) use of aspirin: Secondary | ICD-10-CM | POA: Diagnosis not present

## 2015-07-23 DIAGNOSIS — E43 Unspecified severe protein-calorie malnutrition: Secondary | ICD-10-CM

## 2015-07-23 DIAGNOSIS — Z87891 Personal history of nicotine dependence: Secondary | ICD-10-CM | POA: Diagnosis not present

## 2015-07-23 DIAGNOSIS — J9620 Acute and chronic respiratory failure, unspecified whether with hypoxia or hypercapnia: Secondary | ICD-10-CM | POA: Diagnosis present

## 2015-07-23 LAB — COMPREHENSIVE METABOLIC PANEL
ALBUMIN: 4 g/dL (ref 3.6–5.1)
ALK PHOS: 45 U/L (ref 33–130)
ALT: 30 U/L — AB (ref 6–29)
ALT: 29 U/L (ref 14–54)
AST: 33 U/L (ref 10–35)
AST: 34 U/L (ref 15–41)
Albumin: 3.6 g/dL (ref 3.5–5.0)
Alkaline Phosphatase: 39 U/L (ref 38–126)
Anion gap: 10 (ref 5–15)
BUN: 12 mg/dL (ref 7–25)
BUN: 12 mg/dL (ref 6–20)
CALCIUM: 9.5 mg/dL (ref 8.6–10.4)
CHLORIDE: 102 mmol/L (ref 98–110)
CHLORIDE: 106 mmol/L (ref 101–111)
CO2: 29 mmol/L (ref 20–31)
CO2: 25 mmol/L (ref 22–32)
CREATININE: 0.58 mg/dL — AB (ref 0.60–0.93)
Calcium: 9 mg/dL (ref 8.9–10.3)
Creatinine, Ser: 0.59 mg/dL (ref 0.44–1.00)
GFR calc Af Amer: >60 mL/min (ref >60)
Glucose, Bld: 112 mg/dL — ABNORMAL HIGH (ref 65–99)
Glucose, Bld: 144 mg/dL — ABNORMAL HIGH (ref 65–99)
Potassium: 5.2 mmol/L (ref 3.5–5.3)
Potassium: 5.1 mmol/L (ref 3.5–5.1)
SODIUM: 141 mmol/L (ref 135–145)
Sodium: 144 mmol/L (ref 135–146)
TOTAL PROTEIN: 6.4 g/dL — AB (ref 6.5–8.1)
Total Bilirubin: 0.5 mg/dL (ref 0.2–1.2)
Total Bilirubin: 0.5 mg/dL (ref 0.3–1.2)
Total Protein: 7 g/dL (ref 6.1–8.1)

## 2015-07-23 LAB — CBC
HCT: 42.4 % (ref 36.0–46.0)
Hemoglobin: 13.3 g/dL (ref 12.0–15.0)
MCH: 26.9 pg (ref 26.0–34.0)
MCHC: 31.4 g/dL (ref 30.0–36.0)
MCV: 85.7 fL (ref 78.0–100.0)
Platelets: 239 10*3/uL (ref 150–400)
RBC: 4.95 MIL/uL (ref 3.87–5.11)
RDW: 13.9 % (ref 11.5–15.5)
WBC: 4.6 10*3/uL (ref 4.0–10.5)

## 2015-07-23 MED ORDER — DOXYCYCLINE HYCLATE 100 MG PO TABS
100.0000 mg | ORAL_TABLET | Freq: Two times a day (BID) | ORAL | Status: DC
  Administered 2015-07-23 – 2015-07-25 (×5): 100 mg via ORAL
  Filled 2015-07-23 (×6): qty 1

## 2015-07-23 MED ORDER — BUDESONIDE-FORMOTEROL FUMARATE 160-4.5 MCG/ACT IN AERO
2.0000 | INHALATION_SPRAY | Freq: Two times a day (BID) | RESPIRATORY_TRACT | Status: DC
  Administered 2015-07-23 – 2015-07-24 (×2): 2 via RESPIRATORY_TRACT
  Filled 2015-07-23: qty 6

## 2015-07-23 MED ORDER — IPRATROPIUM BROMIDE 0.02 % IN SOLN
0.5000 mg | Freq: Four times a day (QID) | RESPIRATORY_TRACT | Status: DC
  Administered 2015-07-23 – 2015-07-25 (×9): 0.5 mg via RESPIRATORY_TRACT
  Filled 2015-07-23 (×10): qty 2.5

## 2015-07-23 MED ORDER — NON FORMULARY: 1.0000 | Freq: Two times a day (BID) | Status: DC

## 2015-07-23 MED ORDER — ENSURE ENLIVE PO LIQD
237.0000 mL | Freq: Two times a day (BID) | ORAL | Status: DC
  Administered 2015-07-23 – 2015-07-25 (×5): 237 mL via ORAL

## 2015-07-23 MED ORDER — TIOTROPIUM BROMIDE MONOHYDRATE 18 MCG IN CAPS
18.0000 ug | ORAL_CAPSULE | Freq: Every day | RESPIRATORY_TRACT | Status: DC
  Administered 2015-07-24: 18 ug via RESPIRATORY_TRACT
  Filled 2015-07-23: qty 5

## 2015-07-23 NOTE — Progress Notes (Signed)
The patient states that she feels much better this morning.  She is now on 3L of O2 and her HR is on the 70s-80s.  She did not have any complaints of pain overnight.

## 2015-07-23 NOTE — Progress Notes (Signed)
The patient arrived to room 3E30 from the ED at 2250.  She was oriented to the unit and placed on telemetry.  CCMD was notified.  She is A&Ox4 and her VS are stable.  Her O2 sat level is 99% on 4L of O2.  She does not have any complaints of pain at this time.  She declined being weighed on this standing scale because she was afraid she was going to become too SOB.  She also wanted to be changed from a DNR to a Full Code.  Anita Murphy was notified and she changed the patient's code status in the EMR.  The call bell was explained to the patient and her daughter who is staying at the bedside with the patient tonight.  The patient's bed alarm was turned on and she signed a Fall Prevention Patient Safety Plan.

## 2015-07-23 NOTE — Progress Notes (Signed)
The patient was placed on a continuous pulse ox.

## 2015-07-23 NOTE — Progress Notes (Signed)
Initial Nutrition Assessment  DOCUMENTATION CODES:   Severe malnutrition in context of chronic illness, Underweight  INTERVENTION:   Provide Ensure Enlive po BID, each supplement provides 350 kcal and 20 grams of protein  Provided and discussed "High Calorie High Protein Nutrition Therapy" and "Suggestions for Increasing Calories and Protein" handouts from the Academy of Nutrition and Dietetics.   NUTRITION DIAGNOSIS:   Malnutrition related to catabolic illness as evidenced by severe depletion of body fat, severe depletion of muscle mass.   GOAL:   Patient will meet greater than or equal to 90% of their needs   MONITOR:   PO intake, Supplement acceptance, Labs, Weight trends, Skin  REASON FOR ASSESSMENT:   Consult Assessment of nutrition requirement/status  ASSESSMENT:   74 y/o female with PMH COPD admitted to [facility] on 8/3.   Pt states that her appetite is good and she eats well. She usually eats 2-3 meals and 1-2 snacks daily. For lunch she may eat a sandwich and bowl of soup followed by some ice cream. She does experience early satiety. Pt's daughter at bedside confirms that patient has a good appetite and eats pretty well. Pt also reports some problems with occasional constipation. She used to weight 120 lbs 4 years ago when she was first diagnosed with COPD but, has gradually been losing weight. Her doctor recently recommended drinking Ensure.  RD provided handouts and discussed ways to increase calorie and protein intake inpatient's diet. Recommended that patient aim to eat 6 times daily and to drink 2 nutritional supplements daily, coupons and information provided. Provided diet tips for constipation as well.    Diet Order:  Diet Heart Room service appropriate?: Yes; Fluid consistency:: Thin  Skin:  Reviewed, no issues  Last BM:  8/3  Height:   Ht Readings from Last 1 Encounters:  07/22/15  (1.575 m)    Weight:   Wt Readings from  Last 1 Encounters:  07/23/15 79 lb 12.9 oz (36.2 kg)    Ideal Body Weight:  50 kg  BMI:  Body mass index is 14.59 kg/(m^2).  Estimated Nutritional Needs:   Kcal:  1300-1500  Protein:  55-65 grams  Fluid:  1.3-1.5 L/day  EDUCATION NEEDS:   Education needs addressed  Dorothea Ogle RD, LDN Inpatient Clinical Dietitian Pager: [phone] After Hours Pager: [phone]

## 2015-07-23 NOTE — Progress Notes (Signed)
Family Medicine Teaching Service Daily Progress Note Intern Pager: [phone]  Patient name: Anita Murphy Medical record number: [id number] Date of birth: Dec 18, 1941 Age: 74 y.o. Gender: female  Primary Care Provider: Nilda Simmer, MD Consultants: none (admitted by [facility]) Code Status: Full (verified today)  Pt Overview and Major Events to Date:  8/3 admitted under [facility] 8/4 transfer to [facility]  Assessment and Plan: Anita Murphy is a 74 y.o. female admitted for COPD exacerbation. PMH significant for COPD class III.  Acute on Chronic Resp Failure due to COPD Exacerbation: no prior hospitalizations or intubations. No antibiotic started on admission. Improved resp status since admission. Home meds = xopenex, Symbicort, Tudorza - scheduled xopenex q6hrs, wil add ipratropium to this - supplemental O2 keep sat >92% (currently 2L O2 [place]) - transition solumedrol q12hrs to prednisone  8/5 - restart home symbicort, tudorza  Severe protein-calorie malnutrition: appears cachectic. total protein 6.4. - nutrition consult ordered on admission  Elevated Blood Pressure: no prior diagnosis of HTN. Currently at goal, 98/67.  - monitor clinically - PRN order for hydralazine SBP >150  FEN/GI: reg diet / saline lock PPx: lovenox sq  Disposition: pending clinical improvement, anticipate d/c 8/5  Subjective:  Feels breathing is 50% improved since yesterday. Currently has no complaints. No CP, +SOB, no abdominal pain. Daughter at bedside says she got short of breath when walking to the bathroom.   Objective: Temp:  [97.7 F (36.5 C)-98 F (36.7 C)] 98 F (36.7 C) (08/04 0500) Pulse Rate:  [31-120] 67 (08/04 0500) Resp:  [18-30] 20 (08/04 0500) BP: (97-193)/(62-140) 98/67 mmHg (08/04 0500) SpO2:  [70 %-100 %] 91 % (08/04 0806) Weight:  [78 lb 3.2 oz (35.471 kg)-80 lb 7.5 oz (36.5 kg)] 79 lb 12.9 oz (36.2 kg) (08/04 0240) Physical Exam: General: NAD Cardiovascular: RRR, normal heart  sounds, no murmur appreciated. 2+ radial, PT pulses bilaterally. No LE edema Respiratory: diminished breath sounds bilaterally with poor air movement, scattered minimal wheezes, work of breathing increased Abdomen: thin, soft, nontender, no masses appreciated, normal bowel sounds Extremities: thin muscle bulk. No LE edema. Neuro: alert and oriented x 3, no focal deficits noted  Laboratory:  Recent Labs Lab 07/22/15 1425 07/22/15 1705 07/23/15 0306  WBC 7.1 7.1 4.6  HGB 14.9 13.4 13.3  HCT 46.1* 42.2 42.4  PLT 285 231 239    Recent Labs Lab 07/22/15 1425 07/22/15 1705 07/23/15 0306  NA 144 142 141  K 5.2 4.2 5.1  CL 102 107 106  CO2 BUN CREATININE 0.58* 0.60 0.59  CALCIUM 9.5 8.5* 9.0  PROT 7.0  --  6.4*  BILITOT 0.5  --  0.5  ALKPHOS 45  --  39  ALT 30*  --  29  AST 33  --  34  GLUCOSE 112* 99 144*      Imaging/Diagnostic Tests: Dg Chest Port 1 View  07/22/2015   CLINICAL DATA:  Shortness of breath beginning this morning, history COPD, former smoker  EXAM: PORTABLE CHEST - 1 VIEW  COMPARISON:  Portable exam 1620 hours compared to 10/21/2014  FINDINGS: Normal heart size and mediastinal contours.  Enlarged central pulmonary arteries question pulmonary arterial hypertension.  Emphysematous and bronchitic changes consistent with COPD.  No acute infiltrate, pleural effusion, or pneumothorax.  Mild scattered bullous disease.  No acute osseous findings.  IMPRESSION: COPD changes.  Question pulmonary arterial hypertension.  No acute abnormalities.   Electronically Signed   By: Angelyn Punt.D.  On: 07/22/2015 16:31     Nani Ravens, MD 07/23/2015, 9:14 AM PGY-3, [facility] FPTS Intern pager: [phone], text pages welcome

## 2015-07-24 MED ORDER — ENSURE ENLIVE PO LIQD: 237.0000 mL | Freq: Two times a day (BID) | ORAL | Status: DC

## 2015-07-24 MED ORDER — BUDESONIDE 0.25 MG/2ML IN SUSP: 0.2500 mg | Freq: Two times a day (BID) | RESPIRATORY_TRACT | Status: DC

## 2015-07-24 MED ORDER — PREDNISONE 20 MG PO TABS
40.0000 mg | ORAL_TABLET | Freq: Every day | ORAL | Status: DC
  Filled 2015-07-24: qty 2

## 2015-07-24 MED ORDER — DEXAMETHASONE SODIUM PHOSPHATE 10 MG/ML IJ SOLN
20.0000 mg | Freq: Once | INTRAMUSCULAR | Status: AC
Start: 2015-07-25 — End: 2015-07-25
  Administered 2015-07-25: 20 mg via INTRAVENOUS
  Filled 2015-07-24: qty 2

## 2015-07-24 MED ORDER — IPRATROPIUM BROMIDE 0.02 % IN SOLN: 0.5000 mg | Freq: Four times a day (QID) | RESPIRATORY_TRACT | Status: DC

## 2015-07-24 MED ORDER — BUDESONIDE 0.25 MG/2ML IN SUSP
0.2500 mg | Freq: Two times a day (BID) | RESPIRATORY_TRACT | Status: DC
  Administered 2015-07-24 – 2015-07-25 (×2): 0.25 mg via RESPIRATORY_TRACT
  Filled 2015-07-24 (×5): qty 2

## 2015-07-24 MED ORDER — DOXYCYCLINE HYCLATE 100 MG PO TABS: 100.0000 mg | ORAL_TABLET | Freq: Two times a day (BID) | ORAL | Status: AC

## 2015-07-24 NOTE — Discharge Summary (Signed)
Family Medicine Teaching [facility] Discharge Summary  Patient name: Anita Murphy Medical record number: [id number] Date of birth: [birth year] Age: 74 y.o. Gender: female Date of Admission: 07/22/2015  Date of Discharge: 07/24/2015  Admitting Physician: Hollice Espy, MD  Primary Care Provider: Nilda Simmer, MD Consultants: None  Indication for Hospitalization:  Acute respiratory distress in setting of COPD exacerbation  Discharge Diagnoses/Problem List:  Patient Active Problem List   Diagnosis Date Noted  . Acute on chronic respiratory failure 07/22/2015  . COPD exacerbation 07/22/2015  . Severe protein-calorie malnutrition 07/22/2015  . Elevated blood pressure 07/22/2015  . Routine general medical examination at a health care facility 12/07/2012  . Need for prophylactic vaccination and inoculation against influenza 12/07/2012  . Routine gynecological examination 12/07/2012  . Hypoxia 12/07/2012  . COPD III 12/07/2012  . Weight loss 12/07/2012  . Stress reaction 12/07/2012  . Hordeolum 12/07/2012     Disposition: Home  Discharge Condition: Stable  Discharge Exam:   Temp: [98.1 F (36.7 C)-98.8 F (37.1 C)] 98.1 F (36.7 C) (08/06 0657) Pulse Rate: [78-95] 80 (08/06 0657) Resp: [18-20] 20 (08/06 0657) BP: (96-158)/(50-90) 103/59 mmHg (08/06 0657) SpO2: [91 %-98 %] 96 % (08/06 0746) Weight: [83 lb (37.649 kg)] 83 lb (37.649 kg) (08/06 0657) Physical Exam: General: NAD Cardiovascular: Mild tachycardia, regular rhythm, no murmurs Respiratory: distant breath sounds but clear, air movement throughout, increased WOB off O2, that improves with [place] Abdomen: soft, non tender, non distended Extremities: WWP, no edema  Brief Hospital Course:  74 y/o F with PMH significant for COPD, presented to the ED with respiratory distress concerning for a COPD exacerbation On admission she had a chest xray which was negative for acute process but chronic COPD changes.  HEr admission WBC was 7.1 and her BNP was 65.  She was started on supplemental O2, solumedrol, and continued on her home COPD regimen. On HD2 she was transferred to the family medicine teaching service and was started on doxycline to complete a 5 day course. Her oxygen was weaned as tolerated until this requirement resolved.  On day of discharge her prednisone ( D4 of steroids) was discontinued and was she was transitioned to decadron 20 mg once. Additionally while inpatient her symbicort MDI was transitioned to nebulized pulmacort as there was concern that she was air trapping and ineffectually using the MDI. Her breathing improved wit this therapy, so a prescription for a nebulizer was given to her daughter to fill on 8/5 in anticipation of discharge.  She clinically improved by day of discharged with improved respiratory status and no oxygen requirement  Issues for Follow Up:  1. COPD improvement  Significant Procedures:  None  Significant Labs and Imaging:   Recent Labs Lab 07/22/15 1425 07/22/15 1705 07/23/15 0306  WBC 7.1 7.1 4.6  HGB 14.9 13.4 13.3  HCT 46.1* 42.2 42.4  PLT 285 231 239    Recent Labs Lab 07/22/15 1425 07/22/15 1705 07/23/15 0306  NA 144 142 141  K 5.2 4.2 5.1  CL 102 107 106  CO2 29 26 25   GLUCOSE 112* 99 144*  BUN 12 8 12   CREATININE 0.58* 0.60 0.59  CALCIUM 9.5 8.5* 9.0  ALKPHOS 45  --  39  AST 33  --  34  ALT 30*  --  29  ALBUMIN 4.0  --  3.6      Results/Tests Pending at Time of Discharge: None  Discharge Medications:    Medication List    STOP taking  these medications        budesonide-formoterol 160-4.5 MCG/ACT inhaler  Commonly known as:  SYMBICORT      TAKE these medications        Aclidinium Bromide 400 MCG/ACT Aepb  Commonly known as:  TUDORZA PRESSAIR  Inhale 1 puff into the lungs 2 (two) times daily. One twice daily     aspirin 81 MG chewable tablet  Chew 81 mg by mouth daily.     budesonide 0.25 MG/2ML nebulizer  solution  Commonly known as:  PULMICORT  Take 2 mLs (0.25 mg total) by nebulization 2 (two) times daily.     doxycycline 100 MG tablet  Commonly known as:  VIBRA-TABS  Take 1 tablet (100 mg total) by mouth every 12 (twelve) hours.     feeding supplement (ENSURE ENLIVE) Liqd  Take 237 mLs by mouth 2 (two) times daily between meals.     ipratropium 0.02 % nebulizer solution  Commonly known as:  ATROVENT  Take 2.5 mLs (0.5 mg total) by nebulization every 6 (six) hours.     levalbuterol 45 MCG/ACT inhaler  Commonly known as:  XOPENEX HFA  Inhale 1-2 puffs into the lungs every 4 (four) hours as needed for wheezing.        Discharge Instructions: Please refer to Patient Instructions section of EMR for full details.  Patient was counseled important signs and symptoms that should prompt return to medical care, changes in medications, dietary instructions, activity restrictions, and follow up appointments.   Follow-Up Appointments: Follow-up Information    Schedule an appointment as soon as possible for a visit to follow up.      Bonney Aid, MD 07/24/2015, 3:28 PM PGY-2, [facility]

## 2015-07-24 NOTE — Evaluation (Signed)
Occupational Therapy Evaluation Patient Details Name: Anita Murphy MRN: [id number] DOB: 03-Nov-1941 Today's Date: 07/24/2015    History of Present Illness With past medical history of COPD not on oxygen at home, followed by pulmonary who in the last month had medication changes. Patient states for the last week or so, she been noticing increased shortness of breath. She had a coughing spell a week prior, but nothing since. No fevers. No chest pain. She felt like her breathing was getting worse and today she could not catch her breath she came into the emergency room. Patient initially noted in the emergency room to be quite tachypneic with poor air movement and some wheezing. She was given steroids plus nebulizers initially high-dose oxygen eventually weaned down to 4 L. At that point she was satting 100%. Hospital is call for further evaluation and I requested a blood gas. Blood gas noted some acidosis, however her PCO2 level only mildly elevated and this looked to be more chronic in nature.    Clinical Impression   Pt was independent prior to admission.  Presents with poor activity tolerance, generalized weakness and impaired balance.  Educated in pursed lip breathing technique.  Pt dependent on 3L 02 to keep sats above 90% with ADL and ambulation to and from bathroom.  Will follow acutely.    Follow Up Recommendations  Home health OT    Equipment Recommendations       Recommendations for Other Services       Precautions / Restrictions Precautions Precautions: Fall Precaution Comments: anxious, monitor O2 Restrictions Weight Bearing Restrictions: No      Mobility Bed Mobility               General bed mobility comments: pt up in chair  Transfers Overall transfer level: Needs assistance Equipment used: Rolling walker (2 wheeled) Transfers: Sit to/from Stand Sit to Stand: Supervision         General transfer comment: S for safety with cues due to impulsivity      Balance Overall balance assessment: Needs assistance Sitting-balance support: Feet supported Sitting balance-Leahy Scale: Good     Standing balance support: During functional activity;Bilateral upper extremity supported Standing balance-Leahy Scale: Fair Standing balance comment: Requires use of RW for UE support.                             ADL Overall ADL's : Needs assistance/impaired Eating/Feeding: Independent;Sitting   Grooming: Sitting;Wash/dry hands;Min guard   Upper Body Bathing: Set up;Sitting   Lower Body Bathing: Sit to/from stand;Supervison/ safety   Upper Body Dressing : Set up;Sitting   Lower Body Dressing: Supervision/safety;Sit to/from stand   Toilet Transfer: Medical illustrator and Hygiene: Sit to/from stand;Minimal assistance       Functional mobility during ADLs: Min guard;Rolling walker General ADL Comments: Instructed pt in pursed lip breathing.     Vision     Perception     Praxis      Pertinent Vitals/Pain Pain Assessment: No/denies pain     Hand Dominance Right   Extremity/Trunk Assessment Upper Extremity Assessment Upper Extremity Assessment: Generalized weakness   Lower Extremity Assessment Lower Extremity Assessment: Generalized weakness   Cervical / Trunk Assessment Cervical / Trunk Assessment: Kyphotic   Communication Communication Communication: No difficulties   Cognition Arousal/Alertness: Awake/alert Behavior During Therapy: Anxious Overall Cognitive Status: Within Functional Limits for tasks assessed  General Comments       Exercises       Shoulder Instructions      Home Living Family/patient expects to be discharged to:: Private residence Living Arrangements: Children Available Help at Discharge: Family;Available 24 hours/day Type of Home: House Home Access: Level entry     Home Layout: One level      Bathroom Shower/Tub: Chief Strategy Officer: Standard     Home Equipment: None          Prior Functioning/Environment Level of Independence: Independent             OT Diagnosis: Generalized weakness   OT Problem List: Decreased strength;Decreased activity tolerance;Impaired balance (sitting and/or standing);Decreased knowledge of use of DME or AE;Cardiopulmonary status limiting activity   OT Treatment/Interventions: Self-care/ADL training;Energy conservation;DME and/or AE instruction;Patient/family education    OT Goals(Current goals can be found in the care plan section) Acute Rehab OT Goals Patient Stated Goal: to return home OT Goal Formulation: With patient Time For Goal Achievement: 07/31/15 Potential to Achieve Goals: Good ADL Goals Pt Will Perform Grooming: with supervision;standing (3 activities) Pt Will Transfer to Toilet: with supervision;ambulating;regular height toilet Pt Will Perform Toileting - Clothing Manipulation and hygiene: with supervision;sit to/from stand Pt Will Perform Tub/Shower Transfer: Tub transfer;with supervision;ambulating Additional ADL Goal #1: Pt will demonstrate pursed lip breathing techniques and state at least 3 energy conservation strategies.  OT Frequency: Min 2X/week   Barriers to D/C:            Co-evaluation              End of Session Equipment Utilized During Treatment: Gait belt;Rolling walker;Oxygen  Activity Tolerance: Patient limited by fatigue Patient left: in chair;with call bell/phone within reach;with family/visitor present   Time: 1420-1445 OT Time Calculation (min): 25 min Charges:  OT General Charges $OT Visit: 1 Procedure OT Evaluation $Initial OT Evaluation Tier I: 1 Procedure OT Treatments $Self Care/Home Management : 8-22 mins G-Codes:    Evern Bio 07/24/2015, 4:09 PM  [phone]

## 2015-07-24 NOTE — Progress Notes (Signed)
Family Medicine Teaching Service Daily Progress Note Intern Pager: [phone]  Patient name: Anita Murphy Medical record number: [id number] Date of birth: 28-Jul-1941 Age: 74 y.o. Gender: female  Primary Care Provider: Nilda Simmer, MD Consultants: None Code Status: Full  Pt Overview and Major Events to Date:  8/3 Admitted for COPD exacerbation  Assessment and Plan:  Anita Murphy is a 74 y.o. female admitted for COPD exacerbation. PMH significant for COPD class III.  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 Does not have home O2 requirement - transition solumedrol q12hrs to prednisone  qD today, 8/5, D3 steroids - D2 doxy, continue 5 day course - scheduled xopenex, ipratropium q6 hrs - continue home , tudorza - start pulmicort nebs from symbicort for improved administartion   Severe protein-calorie malnutrition: appears cachectic. total protein 6.4. - nutrition consult, -> enlive supplements  H/o Elevated Blood Pressure: no prior diagnosis of HTN- normal BPs inpatient thus far - monitor clinically - PRN order for hydralazine SBP >150  FEN/GI: reg diet / saline lock PPx: lovenox sq  Disposition: pending clinical improvement, anticipate d/c 8/5  Subjective:  Feels improved breathing, denies chest pain  Objective: Temp:  [97.1 F (36.2 C)-98.4 F (36.9 C)] 98 F (36.7 C) (08/05 0651) Pulse Rate:  [69-88] 77 (08/05 0651) Resp:  [18-22] 20 (08/05 0651) BP: (98-126)/(58-70) 98/58 mmHg (08/05 0651) SpO2:  [92 %-99 %] 93 % (08/05 0748) Weight:  [81 lb 3.2 oz (36.832 kg)] 81 lb 3.2 oz (36.832 kg) (08/05 4034) Physical Exam: General: NAD Cardiovascular: Mild tachycardia, regular rhythm, no murmurs Respiratory: distant breath sounds but clear, air movement throughout Abdomen: soft, non tender, non distended Extremities: WWP, no edema  Laboratory:  Recent Labs Lab 07/22/15 1425  07/22/15 1705 07/23/15 0306  WBC 7.1 7.1 4.6  HGB 14.9 13.4 13.3  HCT 46.1* 42.2 42.4  PLT 285 231 239    Recent Labs Lab 07/22/15 1425 07/22/15 1705 07/23/15 0306  NA 144 142 141  K 5.2 4.2 5.1  CL 102 107 106  CO2 BUN CREATININE 0.58* 0.60 0.59  CALCIUM 9.5 8.5* 9.0  PROT 7.0  --  6.4*  BILITOT 0.5  --  0.5  ALKPHOS 45  --  39  ALT 30*  --  29  AST 33  --  34  GLUCOSE 112* 99 144*     Imaging/Diagnostic Tests: CXR 8/3  COPD changes. Question pulmonary arterial hypertension. No acute abnormalities.    Bonney Aid, MD 07/24/2015, 8:31 AM PGY-2, [facility] FPTS Intern pager: [phone], text pages welcome

## 2015-07-24 NOTE — Progress Notes (Signed)
Nebulizer machine ordered through [facility] as requested. Abelino Derrick [facility] [phone]

## 2015-07-24 NOTE — Evaluation (Signed)
Physical Therapy Evaluation Patient Details Name: Anita Murphy MRN: [id number] DOB: [birth year] Today's Date: 07/24/2015   History of Present Illness  With past medical history of COPD not on oxygen at home, followed by pulmonary who in the last month had medication changes. Patient states for the last week or so, she been noticing increased shortness of breath. She had a coughing spell a week prior, but nothing since. No fevers. No chest pain. She felt like her breathing was getting worse and today she could not catch her breath she came into the emergency room. Patient initially noted in the emergency room to be quite tachypneic with poor air movement and some wheezing. She was given steroids plus nebulizers initially high-dose oxygen eventually weaned down to 4 L. At that point she was satting 100%. Hospital is call for further evaluation and I requested a blood gas. Blood gas noted some acidosis, however her PCO2 level only mildly elevated and this looked to be more chronic in nature.   Clinical Impression  Pt presents with above and decreased mobility, decreased balance, DOE, and anxiety.  Pt able to tolerate short distance gait however needed 2-3LO2 to maintain sats at or above 90%.  Note that respiratory therapist had just given neb treatment and left pt on RA prior to PT session.  Upon PT entering pt at 87% on RA.  PT donned 2LO2 for gait outside of room, where pt was noted to drop to 87-88%, therefore bumped up to 3L and pt able to maintain in 90's.  Once back in room, pt at 86% (per PT and RT pulse ox) on 3LO2.  Re-checked 1 min later on dynamap with sats at 96%, therefore decreased back to RA.  Once PT out of room, family out of room to state that she felt like she needed to cough something up and requested PT back in room.  Sats in the 70's on RA, therefore donned 3LO2 and provided max education on pursed lip breathing.  Pt then able to get sats into high 90's, therefore PT left with OT for next  session on 3LO2.     SATURATION QUALIFICATIONS: (This note is used to comply with regulatory documentation for home oxygen)  Patient Saturations on Room Air at Rest = 87%  Patient Saturations on Room Air while Ambulating = (did not ambulate on RA due to being at 87% on RA)  Patient Saturations on 2-3 Liters of oxygen while Ambulating = 87 (on 2L)-94% (on3L)  Please briefly explain why patient needs home oxygen: Pt will need O2 at home at least with mobility due to decreased saturation.      Follow Up Recommendations Home health PT;Supervision/Assistance - 24 hour    Equipment Recommendations  Other (comment) (rollator)    Recommendations for Other Services       Precautions / Restrictions Precautions Precautions: Fall Precaution Comments: anxious, monitor O2 Restrictions Weight Bearing Restrictions: No      Mobility  Bed Mobility               General bed mobility comments: Pt in recliner when PT arrived.   Transfers Overall transfer level: Needs assistance Equipment used: Rolling walker (2 wheeled) Transfers: Sit to/from Stand Sit to Stand: Supervision         General transfer comment: S for safety with cues due to impulsivity   Ambulation/Gait Ambulation/Gait assistance: Min guard Ambulation Distance (Feet): 40 Feet (with two standing rest breaks) Assistive device: Rolling walker (2 wheeled) Gait Pattern/deviations: Step-through  pattern;Decreased stride length;Drifts right/left;Trunk flexed     General Gait Details: Pt with mildly unsteady gait and is very anxious during gait.  Cues for pursed lip breathing during gait with use of RW for increased safety and energy conservation.  See assessment in note for O2 saturations.   Stairs            Wheelchair Mobility    Modified Rankin (Stroke Patients Only)       Balance Overall balance assessment: Needs assistance Sitting-balance support: Feet supported Sitting balance-Leahy Scale: Good      Standing balance support: During functional activity;Bilateral upper extremity supported Standing balance-Leahy Scale: Fair Standing balance comment: Requires use of RW for UE support.                              Pertinent Vitals/Pain Pain Assessment: No/denies pain    Home Living Family/patient expects to be discharged to:: Private residence Living Arrangements: Children Available Help at Discharge: Family;Available 24 hours/day Type of Home: House Home Access: Level entry     Home Layout: One level Home Equipment: None      Prior Function Level of Independence: Independent               Hand Dominance        Extremity/Trunk Assessment               Lower Extremity Assessment: Generalized weakness      Cervical / Trunk Assessment: Kyphotic  Communication   Communication: No difficulties  Cognition Arousal/Alertness: Awake/alert Behavior During Therapy: WFL for tasks assessed/performed Overall Cognitive Status: Within Functional Limits for tasks assessed                      General Comments      Exercises        Assessment/Plan    PT Assessment Patient needs continued PT services  PT Diagnosis Difficulty walking;Generalized weakness   PT Problem List Decreased strength;Decreased activity tolerance;Decreased balance;Decreased mobility;Decreased knowledge of use of DME;Decreased safety awareness;Cardiopulmonary status limiting activity  PT Treatment Interventions DME instruction;Gait training;Functional mobility training;Therapeutic activities;Therapeutic exercise;Balance training;Patient/family education   PT Goals (Current goals can be found in the Care Plan section) Acute Rehab PT Goals Patient Stated Goal: to return home PT Goal Formulation: With patient/family Time For Goal Achievement: 07/31/15 Potential to Achieve Goals: Good    Frequency Min 3X/week   Barriers to discharge        Co-evaluation                End of Session Equipment Utilized During Treatment: Oxygen Activity Tolerance: Treatment limited secondary to medical complications (Comment) Patient left: in chair;with call bell/phone within reach;with family/visitor present Nurse Communication: Mobility status         Time: 1351-1433 PT Time Calculation (min) (ACUTE ONLY): 42 min   Charges:   PT Evaluation $Initial PT Evaluation Tier I: 1 Procedure PT Treatments $Gait Training: 8-22 mins $Therapeutic Activity: 8-22 mins   PT G Codes:        Vista Deck 07/24/2015, 3:57 PM

## 2015-07-25 MED ORDER — LEVALBUTEROL HCL 1.25 MG/3ML IN NEBU: 0.6300 mg | INHALATION_SOLUTION | RESPIRATORY_TRACT | Status: DC | PRN

## 2015-07-25 NOTE — Discharge Instructions (Signed)
Please schedule a follow up appointment with your provider in the next 1-2 weeks or earlier if you have concerns. Go to the emergency department or call your primary provider if you have any problems with your breathing  Follow-up Information    Schedule an appointment as soon as possible for a visit to follow up.      Stop taking Symbicort and use Pulmicort nebulized twice daily.  For the next 2 days continue ipratropium nebs every 6 hours, then use as needed.  Continue to use Levalbuterol nebs or inhaler every four hours as needed.  You will need to finish your course of antibiotics (Doxycycline) with a dose later tonight and then twice daily for 2 more days.

## 2015-07-25 NOTE — Progress Notes (Signed)
SATURATION QUALIFICATIONS: (This note is used to comply with regulatory documentation for home oxygen)  Patient Saturations on Room Air at Rest = 89%  Patient Saturations on Room Air while Ambulating = 85%  Patient Saturations on 2.5 Liters of oxygen while Ambulating = 93%  Please briefly explain why patient needs home oxygen:

## 2015-07-25 NOTE — Progress Notes (Signed)
Family Medicine Teaching Service Daily Progress Note Intern Pager: [phone]  Patient name: Anita Murphy Medical record number: [id number] Date of birth: 06/23/1941 Age: 74 y.o. Gender: female  Primary Care Provider: Nilda Simmer, MD Consultants: None Code Status: Full  Pt Overview and Major Events to Date:  8/3 Admitted for COPD exacerbation  Assessment and Plan:  Anita Murphy is a 74 y.o. female admitted for COPD exacerbation. PMH significant for COPD class III.  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 total protein 6.4. - nutrition consult, -> enlive supplements  H/o Elevated Blood Pressure: no prior diagnosis of HTN- BPs on low end of normotensive - monitor clinically  FEN/GI: reg diet / saline lock PPx: lovenox sq  Disposition: Anticipate d/c later today after HH needs and home O2 needs addressed by CM.  Subjective:  Feels improved breathing, denies chest pain.  Daughter reports breathing is at baseline.  Objective: Temp:  [98.1 F (36.7 C)-98.8 F (37.1 C)] 98.1 F (36.7 C) (08/06 0657) Pulse Rate:  [78-95] 80 (08/06 0657) Resp:  [18-20] 20 (08/06 0657) BP: (96-158)/(50-90) 103/59 mmHg (08/06 0657) SpO2:  [91 %-98 %] 96 % (08/06 0746) Weight:  [83 lb (37.649 kg)] 83 lb (37.649 kg) (08/06 0657) Physical Exam: General: NAD Cardiovascular: Mild tachycardia, regular rhythm, no murmurs Respiratory: distant breath sounds but clear, air movement throughout, increased WOB off O2, that improves with [place] Abdomen: soft,  non tender, non distended Extremities: WWP, no edema  Laboratory:  Recent Labs Lab 07/22/15 1425 07/22/15 1705 07/23/15 0306  WBC 7.1 7.1 4.6  HGB 14.9 13.4 13.3  HCT 46.1* 42.2 42.4  PLT 285 231 239    Recent Labs Lab 07/22/15 1425 07/22/15 1705 07/23/15 0306  NA 144 142 141  K 5.2 4.2 5.1  CL 102 107 106  CO2 BUN CREATININE 0.58* 0.60 0.59  CALCIUM 9.5 8.5* 9.0  PROT 7.0  --  6.4*  BILITOT 0.5  --  0.5  ALKPHOS 45  --  39  ALT 30*  --  29  AST 33  --  34  GLUCOSE 112* 99 144*     Imaging/Diagnostic Tests: CXR 8/3:  COPD changes. Question pulmonary arterial hypertension. No acute abnormalities.   Erasmo Downer, MD, MPH 07/25/2015, 8:46 AM PGY-2, [facility] FPTS Intern pager: [phone], text pages welcome

## 2015-07-25 NOTE — Progress Notes (Signed)
Patient D/c. Discharge instructions given and all assessments remains unchanged now. Awaiting on transportation

## 2015-07-25 NOTE — Care Management Note (Addendum)
Case Management Note  Patient Details  Name: Anita Murphy MRN: [id number] Date of Birth: 09/08/1941  Subjective/Objective:                   Shortness of breath Action/Plan: Acute on chronic respiratory failure causing COPD exacerbation  Expected Discharge Date:      07/25/2015            Expected Discharge Plan:  Home w Home Health Services  In-House Referral:     Discharge planning Services  CM Consult  Post Acute Care Choice:  Home Health, Durable Medical Equipment Choice offered to:  Adult Children  DME Arranged:  Oxygen DME Agency: [facility]/ [facility]    HH Arranged:  PT, OT, Respirator Therapy HH Agency:   [facility]  Status of Service:  Completed, signed off  Medicare Important Message Given:    Date Medicare IM Given:    Medicare IM give by:    Date Additional Medicare IM Given:    Additional Medicare Important Message give by:     If discussed at Long Length of Stay Meetings, dates discussed:    Additional Comments: Referral for HHPT/OT/Respiratory Care, spoke to patient and daughter patient would be returning to [facility], nebulizer machine and nebs already delivered to the room.  Family requested to be set up with [name] [address] [place] [address] contacted [name] [phone]) to set up services of HHPT/ HHOT/ Respiratory Care information faxed to [phone].  Patient and family wanted [facility] to provide oxygen therapy, contacted [facility] for a portable oxygen tank to get patient home, contacted [facility] to set up oxygen therapy.  Family and patient aware that they have to return the portable tank to [facility] in [facility] address and phone number given to daughter..   Information faxed to [facility] at [facility]  [phone].  Loraine Leriche called later stating he needed a CMN form signed by physician even though signed Face to Face sent with original orders, he explained this  was a new requirement for Medicare compliance that his company used. Fax number given for CM office here.  Pending receipt of the CMN it is to be signed by the physician and faxed back to [facility] the CMN form has not been received at this time.  No other CM needs at this time.   Shirah Roseman, Gerald Leitz, RN 07/25/2015, 4:11 PM   Contacted Dr. Erasmo Downer, MD, MPH  Who was willing to sign the CMN (Certificate of Medical Necessity) form.  Form faxed back to [facility]  At [phone].  Barnett Applebaum, RN 07/25/2015 5:53 PM

## 2015-07-27 ENCOUNTER — Telehealth: Payer: Self-pay | Admitting: *Deleted

## 2015-07-27 NOTE — Telephone Encounter (Signed)
Prior Authorization received from [facility] pharmacy for Ipratropium and Budesonide.  PA forms placed in provider box for completion. Clovis Pu, RN

## 2015-07-28 ENCOUNTER — Telehealth: Payer: Self-pay

## 2015-07-28 DIAGNOSIS — J9621 Acute and chronic respiratory failure with hypoxia: Secondary | ICD-10-CM | POA: Diagnosis not present

## 2015-07-28 DIAGNOSIS — M6281 Muscle weakness (generalized): Secondary | ICD-10-CM | POA: Diagnosis not present

## 2015-07-28 DIAGNOSIS — E43 Unspecified severe protein-calorie malnutrition: Secondary | ICD-10-CM | POA: Diagnosis not present

## 2015-07-28 DIAGNOSIS — I10 Essential (primary) hypertension: Secondary | ICD-10-CM | POA: Diagnosis not present

## 2015-07-28 DIAGNOSIS — J441 Chronic obstructive pulmonary disease with (acute) exacerbation: Secondary | ICD-10-CM | POA: Diagnosis not present

## 2015-07-28 DIAGNOSIS — R2689 Other abnormalities of gait and mobility: Secondary | ICD-10-CM | POA: Diagnosis not present

## 2015-07-28 NOTE — Telephone Encounter (Signed)
PA forms completed by Dr. Kennon Rounds today and faxed to [facility] for review.  The review process takes 24-72 hours to complete.  Clovis Pu, RN

## 2015-07-28 NOTE — Telephone Encounter (Signed)
Prior authorization for nebulized solutions completed  Emmilee Reamer A. Kennon Rounds MD, MS Family Medicine Resident PGY-2 Pager [phone]

## 2015-07-28 NOTE — Telephone Encounter (Signed)
Pt daughter is needing to talk with someone about her mothers recheck appt it is scheduled in sept and both dr Katrinka Blazing and hospital

## 2015-07-29 ENCOUNTER — Telehealth: Payer: Self-pay | Admitting: Family Medicine

## 2015-07-29 NOTE — Telephone Encounter (Signed)
Patient's daughter wants return phone call about when her mother is supposed to return for a follow up. Angie and Dr. Katrinka Blazing are working on this.   [phone] (work #) Anita Murphy daughter

## 2015-07-29 NOTE — Telephone Encounter (Signed)
Received another fax from [facility] needing more information on Ipratropium Solution.  A response is needed by 07/31/15 or PA will be denied.  Fax placed in provider box for review.  Clovis Pu, RN

## 2015-07-29 NOTE — Telephone Encounter (Signed)
Appt made

## 2015-07-29 NOTE — Progress Notes (Signed)
Received call from daughter ( [phone]) about requesting additional services to her [facility]; patient currently has HHPT/ OT and is now requesting a nurse/ aide/ RW; CM contacted the [facility] agency - [facility] [place] [phone]) - they will contact the PCP for additional orders for the service; Alexis Goodell [phone]

## 2015-07-30 DIAGNOSIS — E43 Unspecified severe protein-calorie malnutrition: Secondary | ICD-10-CM | POA: Diagnosis not present

## 2015-07-30 DIAGNOSIS — J441 Chronic obstructive pulmonary disease with (acute) exacerbation: Secondary | ICD-10-CM | POA: Diagnosis not present

## 2015-07-30 DIAGNOSIS — M6281 Muscle weakness (generalized): Secondary | ICD-10-CM | POA: Diagnosis not present

## 2015-07-30 DIAGNOSIS — I10 Essential (primary) hypertension: Secondary | ICD-10-CM | POA: Diagnosis not present

## 2015-07-30 DIAGNOSIS — R2689 Other abnormalities of gait and mobility: Secondary | ICD-10-CM | POA: Diagnosis not present

## 2015-07-30 DIAGNOSIS — J9621 Acute and chronic respiratory failure with hypoxia: Secondary | ICD-10-CM | POA: Diagnosis not present

## 2015-07-31 ENCOUNTER — Telehealth: Payer: Self-pay | Admitting: Student

## 2015-07-31 NOTE — Telephone Encounter (Signed)
Requested documentation for COPD medications faxed to [facility]  Alyssa A. Kennon Rounds MD, MS Family Medicine Resident PGY-2 Pager [phone]

## 2015-08-03 ENCOUNTER — Ambulatory Visit (INDEPENDENT_AMBULATORY_CARE_PROVIDER_SITE_OTHER): Payer: Medicare Other | Admitting: Family Medicine

## 2015-08-03 ENCOUNTER — Encounter: Payer: Self-pay | Admitting: Family Medicine

## 2015-08-03 ENCOUNTER — Telehealth: Payer: Self-pay | Admitting: *Deleted

## 2015-08-03 VITALS — BP 90/70 | HR 72 | Temp 98.9°F | Resp 16 | Ht 64.0 in | Wt 82.8 lb

## 2015-08-03 DIAGNOSIS — E43 Unspecified severe protein-calorie malnutrition: Secondary | ICD-10-CM | POA: Diagnosis not present

## 2015-08-03 DIAGNOSIS — R634 Abnormal weight loss: Secondary | ICD-10-CM | POA: Diagnosis not present

## 2015-08-03 DIAGNOSIS — J9621 Acute and chronic respiratory failure with hypoxia: Secondary | ICD-10-CM

## 2015-08-03 DIAGNOSIS — J449 Chronic obstructive pulmonary disease, unspecified: Secondary | ICD-10-CM

## 2015-08-03 DIAGNOSIS — R636 Underweight: Secondary | ICD-10-CM | POA: Diagnosis not present

## 2015-08-03 DIAGNOSIS — R0902 Hypoxemia: Secondary | ICD-10-CM

## 2015-08-03 NOTE — Progress Notes (Signed)
Subjective:    Patient ID: Anita Murphy, female    DOB: [birth year], 74 y.o.   MRN: [id number]  08/03/2015  Follow-up; form to fill out; and COPD   HPI This 74 y.o. female presents for [place] from recent hospitalization for respiratory failure with hypoxia and COPD exacerbation.  Here is the discharge summary:  Date of Admission: 8/3/2016Date of Discharge: 07/24/2015  Admitting Physician: [name], MD  Primary Care Provider: [name], MD Consultants: None  Indication for Hospitalization:  Acute respiratory distress in setting of COPD exacerbation  Discharge Diagnoses/Problem List:  Patient Active Problem List   Diagnosis Date Noted  . Acute on chronic respiratory failure 07/22/2015  . COPD exacerbation 07/22/2015  . Severe protein-calorie malnutrition 07/22/2015  . Elevated blood pressure 07/22/2015  . Routine general medical examination at a health care facility 12/07/2012  . Need for prophylactic vaccination and inoculation against influenza 12/07/2012  . Routine gynecological examination 12/07/2012  . Hypoxia 12/07/2012  . COPD III 12/07/2012  . Weight loss 12/07/2012  . Stress reaction 12/07/2012  . Hordeolum 12/07/2012   Disposition: Home  Discharge Condition: Stable  Discharge Exam:   Temp: [98.1 F (36.7 C)-98.8 F (37.1 C)] 98.1 F (36.7 C) (08/06 0657) Pulse Rate: [78-95] 80 (08/06 0657) Resp: [18-20] 20 (08/06 0657) BP: (96-158)/(50-90) 103/59 mmHg (08/06 0657) SpO2: [91 %-98 %] 96 % (08/06 0746) Weight: [83 lb (37.649 kg)] 83 lb (37.649 kg) (08/06 0657) Physical Exam: General: NAD Cardiovascular: Mild tachycardia, regular rhythm, no murmurs Respiratory: distant breath sounds but clear, air movement throughout, increased WOB off O2, that improves with [place] Abdomen: soft, non tender, non distended Extremities: WWP, no edema  Brief Hospital Course:   74 y/o F with PMH significant for COPD, presented to the ED with respiratory distress concerning for a COPD exacerbation On admission she had a chest xray which was negative for acute process but chronic COPD changes. HEr admission WBC was 7.1 and her BNP was 65.  She was started on supplemental O2, solumedrol, and continued on her home COPD regimen. On HD2 she was transferred to the family medicine teaching service and was started on doxycline to complete a 5 day course. Her oxygen was weaned as tolerated until this requirement resolved.  On day of discharge her prednisone ( D4 of steroids) was discontinued and was she was transitioned to decadron 20 mg once. Additionally while inpatient her symbicort MDI was transitioned to nebulized pulmacort as there was concern that she was air trapping and ineffectually using the MDI. Her breathing improved wit this therapy, so a prescription for a nebulizer was given to her daughter to fill on 8/5 in anticipation of discharge.  She clinically improved by day of discharged with improved respiratory status and no oxygen requirement  Issues for Follow Up:  1. COPD improvement  Significant Procedures:  None  Significant Labs and Imaging:   Last Labs      Recent Labs Lab 07/22/15 1425 07/22/15 1705 07/23/15 0306  WBC 7.1 7.1 4.6  HGB 14.9 13.4 13.3  HCT 46.1* 42.2 42.4  PLT 285 231 239      Last Labs      Recent Labs Lab 07/22/15 1425 07/22/15 1705 07/23/15 0306  NA 144 142 141  K 5.2 4.2 5.1  CL 102 107 106  CO2 _0 GLUCOSE 112* 99 144*  BUN _1 CREATININE 0.58* 0.60 0.59  CALCIUM 9.5 8.5* 9.0  ALKPHOS 45 --  39  AST 33 --  34  ALT 30* --  29  ALBUMIN 4.0 --  3.6      Results/Tests Pending at Time of Discharge: None  Discharge Medications:    Medication List    STOP taking these medications       budesonide-formoterol  160-4.5 MCG/ACT inhaler  Commonly known as: SYMBICORT      TAKE these medications       Aclidinium Bromide 400 MCG/ACT Aepb  Commonly known as: TUDORZA PRESSAIR  Inhale 1 puff into the lungs 2 (two) times daily. One twice daily     aspirin 81 MG chewable tablet  Chew 81 mg by mouth daily.     budesonide 0.25 MG/2ML nebulizer solution  Commonly known as: PULMICORT  Take 2 mLs (0.25 mg total) by nebulization 2 (two) times daily.     doxycycline 100 MG tablet  Commonly known as: VIBRA-TABS  Take 1 tablet (100 mg total) by mouth every 12 (twelve) hours.     feeding supplement (ENSURE ENLIVE) Liqd  Take 237 mLs by mouth 2 (two) times daily between meals.     ipratropium 0.02 % nebulizer solution  Commonly known as: ATROVENT  Take 2.5 mLs (0.5 mg total) by nebulization every 6 (six) hours.     levalbuterol 45 MCG/ACT inhaler  Commonly known as: XOPENEX HFA  Inhale 1-2 puffs into the lungs every 4 (four) hours as needed for wheezing.        Discharge Instructions: Please refer to Patient Instructions section of EMR for full details. Patient was counseled important signs and symptoms that should prompt return to medical care, changes in medications, dietary instructions, activity restrictions, and follow up appointments.   Follow-Up Appointments: Follow-up Information    Schedule an appointment as soon as possible for a visit to follow up.           Underweight: taking 3 ensures per day since hospital discharge.  Deconditioning: needs a walker rolling with seat but did not receive order.  S/p met with PT during admission.  PT coming out to the house.  Came last Thursday.  Two days per week.    COPD:  Oxygen delivered last week at discharge.  HH delivered the new device next day.  Daughter knows how to use oxygen.  Portable machine that connects to cigarette lighter but did not seem to be working correctly.  Pt did not feel  that she was getting anything.  Placed on continuous machine.  Turned in machine to [place].  Not satisfied with [facility].  Stopped Advair and Spiriva.  Prescribed Pulmicort bid.  Xopenex PRN. Pulmicort bid. Xopenex 1.25 PRN Atrovent tid scheduled.   Oxygen 2liters per day.  Tudorza 1 puff bid.  Dr. Melvyn Novas had stopped Spiriva and started [place] prior to admission; does not have a hospital follow-up scheduled with Dr. Melvyn Novas yet.    [facility] pharmacist needs dx codes for nebulizer solution.  Review of Systems  Constitutional: Negative for fever, chills, diaphoresis and fatigue.  Eyes: Negative for visual disturbance.  Respiratory: Positive for shortness of breath, wheezing and stridor. Negative for cough.   Cardiovascular: Negative for chest pain, palpitations and leg swelling.  Gastrointestinal: Negative for nausea, vomiting, abdominal pain, diarrhea and constipation.  Endocrine: Negative for cold intolerance, heat intolerance, polydipsia, polyphagia and polyuria.  Neurological: Negative for dizziness, tremors, seizures, syncope, facial asymmetry, speech difficulty, weakness, light-headedness, numbness and headaches.    Past Medical History  Diagnosis Date  . Other diseases of  lung, not elsewhere classified   . Allergic rhinitis, cause unspecified   . Shortness of breath   . Personal history of tobacco use, presenting hazards to health   . Elevated blood pressure reading without diagnosis of hypertension   . Chronic airway obstruction, not elsewhere classified   . Other (abnormal) findings on radiological examination of breast    Past Surgical History  Procedure Laterality Date  . Breast surgery Left     4-5 YEARS AGO  . Tubal ligation  1969   No Known Allergies Current Outpatient Prescriptions  Medication Sig Dispense Refill  . Aclidinium Bromide (TUDORZA PRESSAIR) 400 MCG/ACT AEPB Inhale 1 puff into the lungs 2 (two) times daily. One twice daily 1 each 3  . aspirin 81 MG  chewable tablet Chew 81 mg by mouth daily.    . budesonide (PULMICORT) 0.25 MG/2ML nebulizer solution Take 2 mLs (0.25 mg total) by nebulization 2 (two) times daily. 60 mL 12  . feeding supplement, ENSURE ENLIVE, (ENSURE ENLIVE) LIQD Take 237 mLs by mouth 2 (two) times daily between meals. 237 mL 12  . ipratropium (ATROVENT) 0.02 % nebulizer solution Take 2.5 mLs (0.5 mg total) by nebulization every 6 (six) hours. 75 mL 12  . levalbuterol (XOPENEX HFA) 45 MCG/ACT inhaler Inhale 1-2 puffs into the lungs every 4 (four) hours as needed for wheezing. 1 Inhaler 3  . levalbuterol (XOPENEX) 1.25 MG/3ML nebulizer solution Take 0.63 mg by nebulization every 4 (four) hours as needed for wheezing. 72 mL 12   No current facility-administered medications for this visit.   Social History   Social History  . Marital Status: Married    Spouse Name: N/A  . Number of Children: 4  . Years of Education: 12   Occupational History  . 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 Smoking status: Former Smoker -- 1.00 packs/day for 30 years    Types: Cigarettes    Quit date: 12/19/2002  . Smokeless tobacco: Not on file  . Alcohol Use: No  . Drug Use: No  . Sexual Activity: No   Other Topics Concern  . Not on file   Social History Narrative   Always uses seat belts. Smoke alarm and carbon monoxide detector in the home. Guns in the home stored in locked cabinet.       Marital status:  Widowed; married x 52 yrs, happy, no abuse.  Husband with lung cancer and colon cancer.      Lives with one daughter, brother, and grandson.       Exercise: none       Caffeine use: Coffee 2 servings per day.      Employment: retired      ADLs: independent with all ADLs.  No assistant devices for ambulation.  Washes clothes, cooks.        Advanced Directives:  None; DNR/DNI in 2016.   Family History  Problem Relation Age of Onset  . Lung disease Sister   . Hypothyroidism Mother   . Aortic  stenosis Mother   . Dementia Mother   . COPD Sister     smoker  . Heart disease Father   . Mesothelioma Father     asbestos exp  . Cancer Father     LUNG       Objective:    BP 90/70 mmHg  Pulse 72  Temp(Src) 98.9 F (37.2 C) (Oral)  Resp 16  Ht  $'5\' 4"'E$  (1.626 m)  Wt 82 lb 12.8 oz (37.558 kg)  BMI 14.21 kg/m2  SpO2 94% Physical Exam  Constitutional: She is oriented to person, place, and time. She appears well-developed and well-nourished. She appears cachectic. No distress.  HENT:  Head: Normocephalic and atraumatic.  Right Ear: External ear normal.  Left Ear: External ear normal.  Nose: Nose normal.  Mouth/Throat: Oropharynx is clear and moist.  Eyes: Conjunctivae and EOM are normal. Pupils are equal, round, and reactive to light.  Neck: Normal range of motion. Neck supple. Carotid bruit is not present. No thyromegaly present.  Cardiovascular: Normal rate, regular rhythm, normal heart sounds and intact distal pulses.  Exam reveals no gallop and no friction rub.   No murmur heard. Pulmonary/Chest: Effort normal and breath sounds normal. She has no wheezes. She has no rales.  Abdominal: Soft. Bowel sounds are normal. She exhibits no distension and no mass. There is no tenderness. There is no rebound and no guarding.  Lymphadenopathy:    She has no cervical adenopathy.  Neurological: She is alert and oriented to person, place, and time. No cranial nerve deficit.  Skin: Skin is warm and dry. No rash noted. She is not diaphoretic. No erythema. No pallor.  Psychiatric: She has a normal mood and affect. Her behavior is normal.   Results for orders placed or performed during the hospital encounter of 69/79/48  Basic metabolic panel  Result Value Ref Range   Sodium 142 135 - 145 mmol/L   Potassium 4.2 3.5 - 5.1 mmol/L   Chloride 107 101 - 111 mmol/L   CO2 26 22 - 32 mmol/L   Glucose, Bld 99 65 - 99 mg/dL   BUN 8 6 - 20 mg/dL   Creatinine, Ser 0.60 0.44 - 1.00 mg/dL    Calcium 8.5 (L) 8.9 - 10.3 mg/dL   GFR calc non Af Amer >60 >60 mL/min   GFR calc Af Amer >60 >60 mL/min   Anion gap 9 5 - 15  CBC with Differential/Platelet  Result Value Ref Range   WBC 7.1 4.0 - 10.5 K/uL   RBC 4.94 3.87 - 5.11 MIL/uL   Hemoglobin 13.4 12.0 - 15.0 g/dL   HCT 42.2 36.0 - 46.0 %   MCV 85.4 78.0 - 100.0 fL   MCH 27.1 26.0 - 34.0 pg   MCHC 31.8 30.0 - 36.0 g/dL   RDW 13.8 11.5 - 15.5 %   Platelets 231 150 - 400 K/uL   Neutrophils Relative % 65 43 - 77 %   Neutro Abs 4.7 1.7 - 7.7 K/uL   Lymphocytes Relative 26 12 - 46 %   Lymphs Abs 1.8 0.7 - 4.0 K/uL   Monocytes Relative 8 3 - 12 %   Monocytes Absolute 0.6 0.1 - 1.0 K/uL   Eosinophils Relative 0 0 - 5 %   Eosinophils Absolute 0.0 0.0 - 0.7 K/uL   Basophils Relative 1 0 - 1 %   Basophils Absolute 0.0 0.0 - 0.1 K/uL  Brain natriuretic peptide  Result Value Ref Range   B Natriuretic Peptide 65.9 0.0 - 100.0 pg/mL  Comprehensive metabolic panel  Result Value Ref Range   Sodium 141 135 - 145 mmol/L   Potassium 5.1 3.5 - 5.1 mmol/L   Chloride 106 101 - 111 mmol/L   CO2 25 22 - 32 mmol/L   Glucose, Bld 144 (H) 65 - 99 mg/dL   BUN 12 6 - 20 mg/dL   Creatinine, Ser 0.59 0.44 -  1.00 mg/dL   Calcium 9.0 8.9 - 10.3 mg/dL   Total Protein 6.4 (L) 6.5 - 8.1 g/dL   Albumin 3.6 3.5 - 5.0 g/dL   AST 34 15 - 41 U/L   ALT 29 14 - 54 U/L   Alkaline Phosphatase 39 38 - 126 U/L   Total Bilirubin 0.5 0.3 - 1.2 mg/dL   GFR calc non Af Amer >60 >60 mL/min   GFR calc Af Amer >60 >60 mL/min   Anion gap 10 5 - 15  CBC  Result Value Ref Range   WBC 4.6 4.0 - 10.5 K/uL   RBC 4.95 3.87 - 5.11 MIL/uL   Hemoglobin 13.3 12.0 - 15.0 g/dL   HCT 42.4 36.0 - 46.0 %   MCV 85.7 78.0 - 100.0 fL   MCH 26.9 26.0 - 34.0 pg   MCHC 31.4 30.0 - 36.0 g/dL   RDW 13.9 11.5 - 15.5 %   Platelets 239 150 - 400 K/uL  I-Stat Troponin, ED (not at [facility])  Result Value Ref Range   Troponin i, poc 0.01 0.00 - 0.08 ng/mL   Comment 3          I-Stat  arterial blood gas, ED  Result Value Ref Range   pH, Arterial 7.312 (L) 7.350 - 7.450   pCO2 arterial 51.5 (H) 35.0 - 45.0 mmHg   pO2, Arterial 85.0 80.0 - 100.0 mmHg   Bicarbonate 26.1 (H) 20.0 - 24.0 mEq/L   TCO2 28 0 - 100 mmol/L   O2 Saturation 95.0 %   Acid-base deficit 1.0 0.0 - 2.0 mmol/L   Patient temperature 98.6 F    Collection site RADIAL, ALLEN'S TEST ACCEPTABLE    Drawn by RT    Sample type ARTERIAL        Assessment & Plan:   1. COPD III   2. Acute on chronic respiratory failure with hypoxia   3. Severe protein-calorie malnutrition   4. Hypoxia    1.  Acute on chronic respiratory failure with hypoxia: s/p admission; improvement; doing well at home on oxygen supplementation at 2 liters nasal cannula.  Referral for [facility] to go to home and provide education on oxygen supplementation, nebulizer use and care.  Continue with PT twice weekly at home.  Nursing to go out as well and provide education.  Warranting 24 hour oxygen supplementation. 2.  COPD III: with acute exacerbation warranting admission; much improved.  Schedule follow-up appointment with Dr. Melvyn Novas for patient at her request.  Continue current medications as prescribed at discharge; reviewed medications in detail with patient and daughters. 3.  Hypoxia: New.  Requiring 24 hour oxygen supplementation at 2 liters nasal cannula. 4.  Severe protein calorie malnutrition: improving; drinking Ensure daily. 5. Underweight/weight loss: continue Ensure; RTC one month for weight check.  Spend 60 minutes face to face with patient and daughters with 50% of time dedicated to coordination of care, education on use of oxygen equipment and nebulizer equipment.     Orders Placed This Encounter  Procedures  . Ambulatory referral to Pulmonology    Referral Priority:  Routine    Referral Type:  Consultation    Referral Reason:  Specialty Services Required    Referred to Provider:  Tanda Rockers, MD    Requested Specialty:   Pulmonary Disease    Number of Visits Requested:  1  . Ambulatory referral to Home Health    Referral Priority:  Routine    Referral Type:  [place]  Referral Reason:  Specialty Services Required    Requested Specialty:  [place]    Number of Visits Requested:  1    No orders of the defined types were placed in this encounter.    No Follow-up on file.   Loveta Dellis Elayne Guerin, M.D. [facility] [address] [place], [place]  [address] [phone] phone [phone] fax

## 2015-08-03 NOTE — Telephone Encounter (Signed)
Prior Authorization received from [employer] for Levalbuterol.  PA form placed in provider box for completion. Clovis Pu, RN

## 2015-08-04 DIAGNOSIS — E43 Unspecified severe protein-calorie malnutrition: Secondary | ICD-10-CM | POA: Diagnosis not present

## 2015-08-04 DIAGNOSIS — R2689 Other abnormalities of gait and mobility: Secondary | ICD-10-CM | POA: Diagnosis not present

## 2015-08-04 DIAGNOSIS — I10 Essential (primary) hypertension: Secondary | ICD-10-CM | POA: Diagnosis not present

## 2015-08-04 DIAGNOSIS — J9621 Acute and chronic respiratory failure with hypoxia: Secondary | ICD-10-CM | POA: Diagnosis not present

## 2015-08-04 DIAGNOSIS — J441 Chronic obstructive pulmonary disease with (acute) exacerbation: Secondary | ICD-10-CM | POA: Diagnosis not present

## 2015-08-04 DIAGNOSIS — M6281 Muscle weakness (generalized): Secondary | ICD-10-CM | POA: Diagnosis not present

## 2015-08-04 NOTE — Telephone Encounter (Signed)
Spoke with [facility] representative Levalbuterol is covered under Medicare Part B.  [facility] was contacted and informed to run claim under patient's Medicare Part B.  Clovis Pu, RN

## 2015-08-04 NOTE — Telephone Encounter (Signed)
Received a call from [facility] representative stating Ipratropium and Budesonide is covered under Medicare Part B and not Part D.  [facility] contacted and informed to run claims under patient's Medicare Part B and to contact [facility] with questions.  Clovis Pu, RN

## 2015-08-05 ENCOUNTER — Telehealth: Payer: Self-pay | Admitting: Family Medicine

## 2015-08-05 NOTE — Telephone Encounter (Signed)
Patient's daughter called to schedule an appointment for September. Patient is supposed to be seen 4-6 weeks after her last OV. Nothing available and does not want to come to walk in center. Scheduled patient for 09/25/2015 however she wants to know if she can be fit in somehow to the September schedule.  [phone]

## 2015-08-06 ENCOUNTER — Telehealth: Payer: Self-pay

## 2015-08-06 DIAGNOSIS — I10 Essential (primary) hypertension: Secondary | ICD-10-CM | POA: Diagnosis not present

## 2015-08-06 DIAGNOSIS — E43 Unspecified severe protein-calorie malnutrition: Secondary | ICD-10-CM | POA: Diagnosis not present

## 2015-08-06 DIAGNOSIS — J441 Chronic obstructive pulmonary disease with (acute) exacerbation: Secondary | ICD-10-CM | POA: Diagnosis not present

## 2015-08-06 DIAGNOSIS — R2689 Other abnormalities of gait and mobility: Secondary | ICD-10-CM | POA: Diagnosis not present

## 2015-08-06 DIAGNOSIS — J9621 Acute and chronic respiratory failure with hypoxia: Secondary | ICD-10-CM | POA: Diagnosis not present

## 2015-08-06 DIAGNOSIS — M6281 Muscle weakness (generalized): Secondary | ICD-10-CM | POA: Diagnosis not present

## 2015-08-06 NOTE — Telephone Encounter (Signed)
Dr. Katrinka Blazing,  Patient called today stating [facility] needs an order placed before they can start taking Anita Murphy's oxygen levels.  [facility] # [phone]  Thank you,

## 2015-08-06 NOTE — Telephone Encounter (Signed)
1.  Please call [facility] and approve order to check pulse ox with each session.  2.  I placed order for HH to provide education on oxygen supplementation and nebulizer education; please check status of referral and give verbal order to provide any necessary supplies for home oxygen.

## 2015-08-06 NOTE — Telephone Encounter (Signed)
Dr Katrinka Blazing, see note below, and also daughter Maryelizabeth Kaufmann called to ask about order for O2 for pt to [facility]. Pt would need a concentrator to have in home and also a portable unit for travel.

## 2015-08-07 DIAGNOSIS — R2689 Other abnormalities of gait and mobility: Secondary | ICD-10-CM | POA: Diagnosis not present

## 2015-08-07 DIAGNOSIS — I10 Essential (primary) hypertension: Secondary | ICD-10-CM | POA: Diagnosis not present

## 2015-08-07 DIAGNOSIS — J441 Chronic obstructive pulmonary disease with (acute) exacerbation: Secondary | ICD-10-CM | POA: Diagnosis not present

## 2015-08-07 DIAGNOSIS — M6281 Muscle weakness (generalized): Secondary | ICD-10-CM | POA: Diagnosis not present

## 2015-08-07 DIAGNOSIS — E43 Unspecified severe protein-calorie malnutrition: Secondary | ICD-10-CM | POA: Diagnosis not present

## 2015-08-07 DIAGNOSIS — J9621 Acute and chronic respiratory failure with hypoxia: Secondary | ICD-10-CM | POA: Diagnosis not present

## 2015-08-07 NOTE — Telephone Encounter (Signed)
Spoke to [facility] and gave a verbal approval for doing a pulse ox at each session.  They will not take a verbal order and want something in writing faxed to them. Please fax order to [phone].  Thank you.

## 2015-08-10 DIAGNOSIS — J441 Chronic obstructive pulmonary disease with (acute) exacerbation: Secondary | ICD-10-CM | POA: Diagnosis not present

## 2015-08-10 DIAGNOSIS — J9621 Acute and chronic respiratory failure with hypoxia: Secondary | ICD-10-CM | POA: Diagnosis not present

## 2015-08-10 DIAGNOSIS — E43 Unspecified severe protein-calorie malnutrition: Secondary | ICD-10-CM | POA: Diagnosis not present

## 2015-08-10 DIAGNOSIS — M6281 Muscle weakness (generalized): Secondary | ICD-10-CM | POA: Diagnosis not present

## 2015-08-10 DIAGNOSIS — I10 Essential (primary) hypertension: Secondary | ICD-10-CM | POA: Diagnosis not present

## 2015-08-10 DIAGNOSIS — R2689 Other abnormalities of gait and mobility: Secondary | ICD-10-CM | POA: Diagnosis not present

## 2015-08-10 NOTE — Telephone Encounter (Signed)
Order written; to be faxed 08-10-15.

## 2015-08-12 DIAGNOSIS — I10 Essential (primary) hypertension: Secondary | ICD-10-CM | POA: Diagnosis not present

## 2015-08-12 DIAGNOSIS — E43 Unspecified severe protein-calorie malnutrition: Secondary | ICD-10-CM | POA: Diagnosis not present

## 2015-08-12 DIAGNOSIS — M6281 Muscle weakness (generalized): Secondary | ICD-10-CM | POA: Diagnosis not present

## 2015-08-12 DIAGNOSIS — J441 Chronic obstructive pulmonary disease with (acute) exacerbation: Secondary | ICD-10-CM | POA: Diagnosis not present

## 2015-08-12 DIAGNOSIS — R2689 Other abnormalities of gait and mobility: Secondary | ICD-10-CM | POA: Diagnosis not present

## 2015-08-12 DIAGNOSIS — J9621 Acute and chronic respiratory failure with hypoxia: Secondary | ICD-10-CM | POA: Diagnosis not present

## 2015-08-12 NOTE — Telephone Encounter (Signed)
Pt's daughter called to report that [facility] stated they have not gotten any orders for O2 for pt. I checked with [place] at [address] where Dr Katrinka Blazing was on 08/10/15 when order written/faxed to get info on order and she will check w/Dr Smith's asst that day when she comes in to office later today. Need to advise other daughter Maryelizabeth Kaufmann at [phone] of status after order is re-faxed.

## 2015-08-13 DIAGNOSIS — E43 Unspecified severe protein-calorie malnutrition: Secondary | ICD-10-CM | POA: Diagnosis not present

## 2015-08-13 DIAGNOSIS — M6281 Muscle weakness (generalized): Secondary | ICD-10-CM | POA: Diagnosis not present

## 2015-08-13 DIAGNOSIS — I10 Essential (primary) hypertension: Secondary | ICD-10-CM | POA: Diagnosis not present

## 2015-08-13 DIAGNOSIS — J9621 Acute and chronic respiratory failure with hypoxia: Secondary | ICD-10-CM | POA: Diagnosis not present

## 2015-08-13 DIAGNOSIS — J441 Chronic obstructive pulmonary disease with (acute) exacerbation: Secondary | ICD-10-CM | POA: Diagnosis not present

## 2015-08-13 DIAGNOSIS — R2689 Other abnormalities of gait and mobility: Secondary | ICD-10-CM | POA: Diagnosis not present

## 2015-08-14 NOTE — Telephone Encounter (Signed)
The original order never made it to either the local [facility] or the one near pt. I spoke to Tammy at [place] servicing pt's area at [phone], fax # [phone], and faxed over new order signed for Dr Katrinka Blazing by Tawanna Cooler, in her absence, along w/OV notes from last two OVs. Called Tammy back and she reported that she thinks she has everything she needs from [place]. Called [facility] and notified her of status and that she should be hearing from [place].

## 2015-08-18 ENCOUNTER — Encounter: Payer: Self-pay | Admitting: Family Medicine

## 2015-08-18 DIAGNOSIS — J9621 Acute and chronic respiratory failure with hypoxia: Secondary | ICD-10-CM | POA: Diagnosis not present

## 2015-08-18 DIAGNOSIS — I10 Essential (primary) hypertension: Secondary | ICD-10-CM | POA: Diagnosis not present

## 2015-08-18 DIAGNOSIS — R2689 Other abnormalities of gait and mobility: Secondary | ICD-10-CM | POA: Diagnosis not present

## 2015-08-18 DIAGNOSIS — M6281 Muscle weakness (generalized): Secondary | ICD-10-CM | POA: Diagnosis not present

## 2015-08-18 DIAGNOSIS — J441 Chronic obstructive pulmonary disease with (acute) exacerbation: Secondary | ICD-10-CM | POA: Diagnosis not present

## 2015-08-18 DIAGNOSIS — E43 Unspecified severe protein-calorie malnutrition: Secondary | ICD-10-CM | POA: Diagnosis not present

## 2015-08-20 DIAGNOSIS — J9621 Acute and chronic respiratory failure with hypoxia: Secondary | ICD-10-CM | POA: Diagnosis not present

## 2015-08-20 DIAGNOSIS — E43 Unspecified severe protein-calorie malnutrition: Secondary | ICD-10-CM | POA: Diagnosis not present

## 2015-08-20 DIAGNOSIS — R2689 Other abnormalities of gait and mobility: Secondary | ICD-10-CM | POA: Diagnosis not present

## 2015-08-20 DIAGNOSIS — I10 Essential (primary) hypertension: Secondary | ICD-10-CM | POA: Diagnosis not present

## 2015-08-20 DIAGNOSIS — M6281 Muscle weakness (generalized): Secondary | ICD-10-CM | POA: Diagnosis not present

## 2015-08-20 DIAGNOSIS — J441 Chronic obstructive pulmonary disease with (acute) exacerbation: Secondary | ICD-10-CM | POA: Diagnosis not present

## 2015-08-21 ENCOUNTER — Telehealth: Payer: Self-pay | Admitting: Family Medicine

## 2015-08-21 NOTE — Telephone Encounter (Signed)
Patient's daughter faxed FMLA forms on 08/20/2015. She needs FMLA for her job because she helps take care of her mother who is a patient of Dr. Katrinka Blazing. I have completed this form it needs your review and signature. Placed in your box on 08/21/2015. Return to [facility] department upon completion.  Leavy Cella

## 2015-08-27 ENCOUNTER — Telehealth: Payer: Self-pay

## 2015-08-27 NOTE — Telephone Encounter (Signed)
Pt called and is trying to get rx refill on nebulizer medication, ipratropium, states we need to called [place] Part D Rx at [phone] to give an authorization for this medication Please call pt at [phone] to advise

## 2015-08-28 ENCOUNTER — Ambulatory Visit: Payer: Medicare Other | Admitting: Internal Medicine

## 2015-08-31 ENCOUNTER — Ambulatory Visit: Payer: Medicare Other | Admitting: Family Medicine

## 2015-08-31 ENCOUNTER — Encounter: Payer: Self-pay | Admitting: Family Medicine

## 2015-08-31 ENCOUNTER — Encounter: Payer: Self-pay | Admitting: Internal Medicine

## 2015-08-31 ENCOUNTER — Ambulatory Visit (INDEPENDENT_AMBULATORY_CARE_PROVIDER_SITE_OTHER): Payer: Medicare Other | Admitting: Internal Medicine

## 2015-08-31 ENCOUNTER — Ambulatory Visit (INDEPENDENT_AMBULATORY_CARE_PROVIDER_SITE_OTHER): Payer: Medicare Other | Admitting: Family Medicine

## 2015-08-31 VITALS — BP 120/86 | HR 93 | Ht 64.0 in | Wt 87.0 lb

## 2015-08-31 VITALS — BP 140/80 | HR 86 | Temp 97.3°F | Resp 18 | Wt 87.4 lb

## 2015-08-31 DIAGNOSIS — J449 Chronic obstructive pulmonary disease, unspecified: Secondary | ICD-10-CM | POA: Diagnosis not present

## 2015-08-31 DIAGNOSIS — Z23 Encounter for immunization: Secondary | ICD-10-CM

## 2015-08-31 DIAGNOSIS — R0902 Hypoxemia: Secondary | ICD-10-CM | POA: Diagnosis not present

## 2015-08-31 DIAGNOSIS — K5901 Slow transit constipation: Secondary | ICD-10-CM | POA: Diagnosis not present

## 2015-08-31 DIAGNOSIS — E43 Unspecified severe protein-calorie malnutrition: Secondary | ICD-10-CM | POA: Diagnosis not present

## 2015-08-31 DIAGNOSIS — R030 Elevated blood-pressure reading, without diagnosis of hypertension: Secondary | ICD-10-CM

## 2015-08-31 DIAGNOSIS — J9611 Chronic respiratory failure with hypoxia: Secondary | ICD-10-CM | POA: Diagnosis not present

## 2015-08-31 DIAGNOSIS — R634 Abnormal weight loss: Secondary | ICD-10-CM | POA: Diagnosis not present

## 2015-08-31 DIAGNOSIS — IMO0001 Reserved for inherently not codable concepts without codable children: Secondary | ICD-10-CM

## 2015-08-31 MED ORDER — FORMOTEROL FUMARATE 20 MCG/2ML IN NEBU: 20.0000 ug | INHALATION_SOLUTION | Freq: Two times a day (BID) | RESPIRATORY_TRACT | Status: DC

## 2015-08-31 MED ORDER — ACLIDINIUM BROMIDE 400 MCG/ACT IN AEPB: 1.0000 | INHALATION_SPRAY | Freq: Two times a day (BID) | RESPIRATORY_TRACT | Status: DC

## 2015-08-31 NOTE — Patient Instructions (Addendum)
Plan A = automatic = Stop atrovent and start Tudorza one twice daily  Plus in nebulizer:  budesonide and perforomist twice daily   Pan B = Backup  If Can't get your breath, use neb  levoalbuterol every 4 hours and with budesonide twice daily if not able to get performoist rx filled by [facility] Under Medicare B  All of your solutions for your nebulizer are Part B and all the others are part D   Please schedule a follow up office visit in 6 weeks, call sooner if needed

## 2015-08-31 NOTE — Progress Notes (Signed)
Subjective:    Patient ID: Anita Murphy, female    DOB: 05-25-1941  MRN: [id number]    Brief patient profile:  74  yobf quit smoking 2004 with no resp problems until around 2009 referred to pulmonary clinic 05/31/2013 by DR Nilda Simmer for ? Copd > proved to have GOLD III severity 08/21/2013    History of Present Illness  05/31/2013 1st pulmonary ov cc acutely short of breath p exp to cleaner in 2009  and since then variable doe on symbicort and spiriva  - seems better with saba x  few hours then worse again.  rec Continue Symbicort 160 Take 2 puffs first thing in am and then another 2 puffs about 12 hours later.  And spiriva one daily  Only use your albuterol (xopenex)as a rescue medication    10/21/2014 f/u ov/Dezi Schaner re: GOLD III copd  Chief Complaint  Patient presents with  . Follow-up    Pt states that her breathing is unchanged since the last visit. Using xopenex inhaler on average 2 x per day.   doe x getting excited  To mailbox and back s stopping but definitely her limit   No noct symptoms rec Work on hfa  technique     03/23/2015 f/u ov/Evalin Shawhan re: GOLD III copd spiriva/ symbicort Chief Complaint  Patient presents with  . Follow-up    Pt states her breathing is about the same. Some worse today "when I realized I was late".  She uses xopenex 1-2 x per day on average.   Uses xopenex around 1230 pm ,most every day not clear why No change doe x mb and back  Can't afford spiriva per insurance but doesn't know alternatives from formulary  rec Plan A = automatic = symbicort 160 x 2 puffs  then followed by tudorza x 1 puff and do this twice daily Plan B= Only use your xopenex  as a rescue medication  Work on inhaler technique    Expand All Collapse All     Date of Admission: 8/3/2016Date of Discharge: 07/24/2015    Indication for Hospitalization:  Acute respiratory distress in setting of COPD exacerbation  Discharge Diagnoses/Problem List:   Patient Active Problem List   Diagnosis Date Noted  . Acute on chronic respiratory failure 07/22/2015  . COPD exacerbation 07/22/2015  . Severe protein-calorie malnutrition 07/22/2015  . Elevated blood pressure 07/22/2015  . Routine general medical examination at a health care facility 12/07/2012  . Need for prophylactic vaccination and inoculation against influenza 12/07/2012  . Routine gynecological examination 12/07/2012  . Hypoxia 12/07/2012  . COPD III 12/07/2012  . Weight loss 12/07/2012  . Stress reaction 12/07/2012  . Hordeolum 12/07/2012     Disposition: Home  Discharge Condition: Stable  Discharge Exam:   Temp: [98.1 F (36.7 C)-98.8 F (37.1 C)] 98.1 F (36.7 C) (08/06 0657) Pulse Rate: [78-95] 80 (08/06 0657) Resp: [18-20] 20 (08/06 0657) BP: (96-158)/(50-90) 103/59 mmHg (08/06 0657) SpO2: [91 %-98 %] 96 % (08/06 0746) Weight: [83 lb (37.649 kg)] 83 lb (37.649 kg) (08/06 0657) Physical Exam: General: NAD Cardiovascular: Mild tachycardia, regular rhythm, no murmurs Respiratory: distant breath sounds but clear, air movement throughout, increased WOB off O2, that improves with [place] Abdomen: soft, non tender, non distended Extremities: WWP, no edema  Brief Hospital Course:  74 y/o F with PMH significant for COPD, presented to the ED with respiratory distress concerning for a COPD exacerbation On admission she had a chest xray which was negative for acute process  but chronic COPD changes. HEr admission WBC was 7.1 and her BNP was 65.  She was started on supplemental O2, solumedrol, and continued on her home COPD regimen. On HD2 she was transferred to the family medicine teaching service and was started on doxycline to complete a 5 day course. Her oxygen was weaned as tolerated until this requirement resolved.  On day of discharge her prednisone ( D4 of steroids) was discontinued and was she was transitioned to decadron  20 mg once. Additionally while inpatient her symbicort MDI was transitioned to nebulized pulmacort as there was concern that she was air trapping and ineffectually using the MDI. Her breathing improved wit this therapy, so a prescription for a nebulizer was given to her daughter to fill on 8/5 in anticipation of discharge.  She clinically improved by day of discharged with improved respiratory status and no oxygen requirement            08/31/2015 post hosp f/u ov/Karcyn Menn re: GOLD III criteria/ 2lpm 02 24/7   Chief Complaint  Patient presents with  . Follow-up    Pt c/o increased SOB since the last visit here in April 2016.     24/7 02 2lpm x walk slowly room / no cough/ fm very confused with meds / names/ B vs D medicare rules   No obvious daytime variabilty or assoc chronic cough or cp or chest tightness, subjective wheeze overt sinus or hb symptoms. No unusual other exposure hx or h/o childhood pna/ asthma or knowledge of premature birth.   Sleeping ok without nocturnal  or early am exacerbation  of respiratory  c/o's or need for noct saba. Also denies any obvious fluctuation of symptoms with weather or environmental changes or other aggravating or alleviating factors except as outlined above  .   Current Medications, Allergies, Past Medical History, Past Surgical History, Family History, and Social History were reviewed in [employer] record.  ROS  The following are not active complaints unless bolded sore throat, dysphagia, dental problems, itching, sneezing,  nasal congestion or excess/ purulent secretions, ear ache,   fever, chills, sweats, unintended wt loss, pleuritic or exertional cp, hemoptysis,  orthopnea pnd or leg swelling, presyncope, palpitations, heartburn, abdominal pain, anorexia, nausea, vomiting, diarrhea  or change in bowel or urinary habits, change in stools or urine, dysuria,hematuria,  rash, arthralgias, visual complaints, headache, numbness  weakness or ataxia or problems with walking or coordination,  change in mood/affect or memory.          Objective:   Physical Exam  W/c bound bf nad/ vital signs reviewed  08/31/2015        87  03/23/2015          84  10/21/2014        87  11/19/2013       104  08/21/2013         100  Wt Readings from Last 3 Encounters:  05/31/13 106 lb (48.081 kg)  12/05/12 104 lb (47.174 kg)  10/20/11 121 lb (54.885 kg)    HEENT mild turbinate edema.  Oropharynx no thrush or excess pnd or cobblestoning.  No JVD or cervical adenopathy. Mild accessory muscle hypertrophy. Trachea midline, nl thryroid. Chest was hyperinflated by percussion with diminished breath sounds and moderate increased exp time without wheeze. Hoover sign positive at mid inspiration. Regular rate and rhythm without murmur gallop or rub or increase P2 or edema.  Abd: no hsm, nl excursion. Ext warm without cyanosis or  clubbing.       I personally reviewed images and agree with radiology impression as follows:  CXR:  07/22/15 COPD changes. Question pulmonary arterial hypertension. No acute abnormalities.         Assessment & Plan:

## 2015-08-31 NOTE — Progress Notes (Signed)
Subjective:    Patient ID: Anita Murphy, female    DOB: February 16, 1941, 74 y.o.   MRN: [id number]  08/31/2015  Follow-up; Weight Loss; and COPD   HPI This 74 y.o. female presents for one month follow-up of weight loss.  Weight up 5 pounds from visit one month ago.  Drinking 3 Ensures per day.  Appetite has really increased since starting Remeron. B:  Boiled eggs or fried eggs, sausage, banana, oatmeal, Ensure. Coffee, bagels with cream cheese.  Some water. Snack: cheese rounds, Miralax. Lunch:  Sandwiches with tomatoes, mayo, provolone cheese, steaks on the grill, chicken salad.  Milkshake or juice. Snack: cookies Supper:  Cakes,     2.  COPD/respiratory failure: s/p admission in August; appointment today with pulmonology.  PT at home.  Oxygen supplementation 24 hours per day now.  Referred for HH to provide education on oxygen supplementation and nebulizer usage.  Pulmicort bid. Xopenex 1.25 PRN Atrovent tid scheduled.  Oxygen 2liters per day.  Tudorza 1 puff bid  [facility] came out since last visit; sending a respiratory therapist.  Patsy Lager came out on Friday; [facility] gave resistance to switching to [facility].  Daughter had to insist to transfer care to [facility].  May take [facility] two months for changing out oxygen tanks.  Delivered tanks and educated on tanks; coming back out.  To order nebulizers with discount.  He will send out respiratory therapist to provide education.    Constipation: taking Miralax daily or qod.  No more straining with b.ms.    Review of Systems  Constitutional: Negative for fever, chills, diaphoresis and fatigue.  Eyes: Negative for visual disturbance.  Respiratory: Positive for shortness of breath. Negative for cough, wheezing and stridor.   Cardiovascular: Negative for chest pain, palpitations and leg swelling.  Gastrointestinal: Negative for nausea, vomiting, abdominal pain, diarrhea and constipation.  Endocrine: Negative for cold intolerance, heat  intolerance, polydipsia, polyphagia and polyuria.  Neurological: Negative for dizziness, tremors, seizures, syncope, facial asymmetry, speech difficulty, weakness, light-headedness, numbness and headaches.    Past Medical History  Diagnosis Date  . Other diseases of lung, not elsewhere classified   . Allergic rhinitis, cause unspecified   . Personal history of tobacco use, presenting hazards to health   . Elevated blood pressure reading without diagnosis of hypertension   . Chronic airway obstruction, not elsewhere classified   . Other (abnormal) findings on radiological examination of breast    Past Surgical History  Procedure Laterality Date  . Breast surgery Left     4-5 YEARS AGO  . Tubal ligation  1969   No Known Allergies Current Outpatient Prescriptions  Medication Sig Dispense Refill  . Aclidinium Bromide (TUDORZA PRESSAIR) 400 MCG/ACT AEPB Inhale 1 puff into the lungs 2 (two) times daily. One twice daily 1 each 3  . aspirin 81 MG chewable tablet Chew 81 mg by mouth daily.    . budesonide (PULMICORT) 0.25 MG/2ML nebulizer solution Take 2 mLs (0.25 mg total) by nebulization 2 (two) times daily. 60 mL 12  . feeding supplement, ENSURE ENLIVE, (ENSURE ENLIVE) LIQD Take 237 mLs by mouth 2 (two) times daily between meals. 237 mL 12  . ipratropium (ATROVENT) 0.02 % nebulizer solution Take 2.5 mLs (0.5 mg total) by nebulization every 6 (six) hours. 75 mL 12  . levalbuterol (XOPENEX HFA) 45 MCG/ACT inhaler Inhale 1-2 puffs into the lungs every 4 (four) hours as needed for wheezing. 1 Inhaler 3  . levalbuterol (XOPENEX) 1.25 MG/3ML nebulizer solution Take 0.63 mg  by nebulization every 4 (four) hours as needed for wheezing. 72 mL 12  . mirtazapine (REMERON) 7.5 MG tablet     . polyethylene glycol powder (GLYCOLAX/MIRALAX) powder MIX 17GM UTD AND TK  PO DAILY  11   No current facility-administered medications for this visit.   Social History   Social History  . Marital Status:  Married    Spouse Name: N/A  . Number of Children: 4  . Years of Education: 12   Occupational History  . 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 Smoking status: Former Smoker -- 1.00 packs/day for 30 years    Types: Cigarettes    Quit date: 12/19/2002  . Smokeless tobacco: Not on file  . Alcohol Use: No  . Drug Use: No  . Sexual Activity: No   Other Topics Concern  . Not on file   Social History Narrative   Always uses seat belts. Smoke alarm and carbon monoxide detector in the home. Guns in the home stored in locked cabinet.       Marital status:  Widowed; married x 52 yrs, happy, no abuse.  Husband with lung cancer and colon cancer.      Lives with one daughter, brother, and grandson.       Exercise: none       Caffeine use: Coffee 2 servings per day.      Employment: retired      ADLs: independent with all ADLs.  No assistant devices for ambulation.  Washes clothes, cooks.        Advanced Directives:  None; DNR/DNI in 2016.   Family History  Problem Relation Age of Onset  . Lung disease Sister   . Hypothyroidism Mother   . Aortic stenosis Mother   . Dementia Mother   . COPD Sister     smoker  . Heart disease Father   . Mesothelioma Father     asbestos exp  . Cancer Father     LUNG       Objective:    BP 140/80 mmHg  Pulse 86  Temp(Src) 97.3 F (36.3 C) (Oral)  Resp 18  Wt 87 lb 6.4 oz (39.644 kg)  SpO2 87% Physical Exam  Constitutional: She is oriented to person, place, and time. She appears well-developed and well-nourished. No distress.  HENT:  Head: Normocephalic and atraumatic.  Right Ear: External ear normal.  Left Ear: External ear normal.  Nose: Nose normal.  Mouth/Throat: Oropharynx is clear and moist.  Eyes: Conjunctivae and EOM are normal. Pupils are equal, round, and reactive to light.  Neck: Normal range of motion. Neck supple. Carotid bruit is not present. No thyromegaly present.  Cardiovascular: Normal  rate, regular rhythm, normal heart sounds and intact distal pulses.  Exam reveals no gallop and no friction rub.   No murmur heard. Pulmonary/Chest: Effort normal and breath sounds normal. She has no wheezes. She has no rales.  Lymphadenopathy:    She has no cervical adenopathy.  Neurological: She is alert and oriented to person, place, and time. No cranial nerve deficit.  Skin: Skin is warm and dry. No rash noted. She is not diaphoretic. No erythema. No pallor.  Psychiatric: She has a normal mood and affect. Her behavior is normal.   INFLUENZA VACCINE ADMINISTERED.     Assessment & Plan:   1. Severe protein-calorie malnutrition   2. Weight loss   3. Hypoxia   4. COPD  III   5. Elevated blood pressure   6. Slow transit constipation   7. Need for prophylactic vaccination and inoculation against influenza      1. Severe protein malnutrition: improving; weight increased by five pounds in past month; continue Ensure tid; continue Remeron therapy at current dose. 2.  Weight loss; improved; weight increased by five pounds in pats month; RTC two months for weight check. 3.  COPD with hypoxia: stable; follow-up with pulmonology today since hospitalization last month.   4.  Elevated blood pressure: persistent; continue to monitor; may warrant medication at follow-up visit especially if continues to gain weight. 5.  Constipation: improved with daily to qod Miralax. 6.  S/p flu vaccine.   Orders Placed This Encounter  Procedures  . Flu Vaccine QUAD 36+ mos IM   Meds ordered this encounter  Medications  . mirtazapine (REMERON) 7.5 MG tablet    Sig:   . polyethylene glycol powder (GLYCOLAX/MIRALAX) powder    Sig: MIX 17GM UTD AND TK  PO DAILY    Refill:  11    Return in about 2 months (around 10/31/2015) for recheck weight.    Elienai Gailey Paulita Fujita, M.D. [facility] [address] [place], [place]  [address] [phone] phone [phone] fax

## 2015-09-01 NOTE — Telephone Encounter (Signed)
Completed and returned to disability desk.

## 2015-09-01 NOTE — Telephone Encounter (Signed)
Talked to pt since she is seeing Dr [name] for her COPD and advised that he is the best provider to manage her COPD meds. She agreed that this would be appropriate and will call his office.

## 2015-09-02 ENCOUNTER — Encounter: Payer: Self-pay | Admitting: Internal Medicine

## 2015-09-02 DIAGNOSIS — J9612 Chronic respiratory failure with hypercapnia: Secondary | ICD-10-CM

## 2015-09-02 DIAGNOSIS — J9611 Chronic respiratory failure with hypoxia: Secondary | ICD-10-CM | POA: Insufficient documentation

## 2015-09-02 NOTE — Assessment & Plan Note (Addendum)
-  Spirometry 12/05/12 FEV1  0.51 (30% ratio 40    - PFT's  08/21/2013   FEV1  0.52 ( 33%) ratio 42 and no change B2 - 08/31/2015 p extensive coaching HFA effectiveness =  HFA  0/  90% with dpi > rec tudorza and pulm/perforomist bid neb since there is no LAMA in neb form and she really needs it   Despite a great deal of confusion with meds she's doing ok at present but will need much better organization/ understanding of meds going forward or will need a higher level of care  I had an extended discussion with the patient reviewing all relevant studies completed to date and  lasting 15 to 20 minutes of a 25 minute visit    Formulary restrictions will be an ongoing challenge for the forseable future and I would be happy to pick an alternative if the pt will first  provide me a list of them but pt  will need to return here for training for any new device that is required eg dpi vs hfa vs respimat.    In meantime we can always provide samples so the patient never runs out of any needed respiratory medications.   Each maintenance medication was reviewed in detail including most importantly the difference between maintenance and prns and under what circumstances the prns are to be triggered using an action plan format that is not reflected in the computer generated alphabetically organized AVS.    Please see instructions for details which were reviewed in writing and the patient given a copy highlighting the part that I personally wrote and discussed at today's ov.

## 2015-09-02 NOTE — Assessment & Plan Note (Signed)
D/c on 2lpm 07/24/15 from [facility]   No hypercarbia suggested by bmets  Adequate control on present rx, reviewed > no change in rx needed  = 2lpm 24/7 for now

## 2015-09-23 ENCOUNTER — Telehealth: Payer: Self-pay | Admitting: Internal Medicine

## 2015-09-23 MED ORDER — BUDESONIDE 0.25 MG/2ML IN SUSP: 0.2500 mg | Freq: Two times a day (BID) | RESPIRATORY_TRACT | Status: DC

## 2015-09-23 MED ORDER — LEVALBUTEROL HCL 1.25 MG/3ML IN NEBU: 0.6300 mg | INHALATION_SOLUTION | RESPIRATORY_TRACT | Status: DC | PRN

## 2015-09-23 MED ORDER — FORMOTEROL FUMARATE 20 MCG/2ML IN NEBU: 20.0000 ug | INHALATION_SOLUTION | Freq: Two times a day (BID) | RESPIRATORY_TRACT | Status: DC

## 2015-09-23 NOTE — Telephone Encounter (Signed)
Patient called and said that her Nebulizer medications need to be sent to [facility] with DX Codes.   RX resubmitted with DX codes. Nothing further needed. Patient notified.

## 2015-09-24 ENCOUNTER — Telehealth: Payer: Self-pay | Admitting: Internal Medicine

## 2015-09-24 NOTE — Telephone Encounter (Signed)
Advised the pharmacist at [facility] that I am unsure why this was sent in this way. There is no documentation from [facility] stating the pt should only use half of the ampule when taking a breathing treatment. This has been fixed. Nothing further was needed.

## 2015-09-25 ENCOUNTER — Ambulatory Visit: Payer: Medicare Other | Admitting: Family Medicine

## 2015-09-29 ENCOUNTER — Telehealth: Payer: Self-pay

## 2015-09-29 NOTE — Telephone Encounter (Signed)
Denise with [facility] is needing for dr Katrinka Blazing to change the spot on the front of the form section b length of need write 99 above what she already has filled out and date it and initial fax to [phone]

## 2015-10-16 ENCOUNTER — Encounter: Payer: Self-pay | Admitting: Internal Medicine

## 2015-10-16 ENCOUNTER — Ambulatory Visit (INDEPENDENT_AMBULATORY_CARE_PROVIDER_SITE_OTHER): Payer: Medicare Other | Admitting: Internal Medicine

## 2015-10-16 VITALS — BP 134/76 | HR 102 | Ht 62.0 in | Wt 90.0 lb

## 2015-10-16 DIAGNOSIS — J9611 Chronic respiratory failure with hypoxia: Secondary | ICD-10-CM

## 2015-10-16 DIAGNOSIS — J449 Chronic obstructive pulmonary disease, unspecified: Secondary | ICD-10-CM | POA: Diagnosis not present

## 2015-10-16 NOTE — Patient Instructions (Signed)
No change in medications or 02 for now.  ipatropium is four times daily whereas as performist and budesonide twice daily  Please schedule a follow up visit in 3 months but call sooner if needed

## 2015-10-16 NOTE — Progress Notes (Signed)
Subjective:    Patient ID: Anita Murphy, female    DOB: 08-23-1941    MRN: [id number]    Brief patient profile:  74  yobf quit smoking 2004 with no resp problems until around 2009 referred to pulmonary clinic 05/31/2013 by DR Nilda Simmer for ? Copd > proved to have GOLD III severity 08/21/2013    History of Present Illness  05/31/2013 1st pulmonary ov cc acutely short of breath p exp to cleaner in 2009  and since then variable doe on symbicort and spiriva  - seems better with saba x  few hours then worse again.  rec Continue Symbicort 160 Take 2 puffs first thing in am and then another 2 puffs about 12 hours later.  And spiriva one daily  Only use your albuterol (xopenex)as a rescue medication    10/21/2014 f/u ov/Anita Murphy re: GOLD III copd  Chief Complaint  Patient presents with  . Follow-up    Pt states that her breathing is unchanged since the last visit. Using xopenex inhaler on average 2 x per day.   doe x getting excited  To mailbox and back s stopping but definitely her limit   No noct symptoms rec Work on hfa  technique     03/23/2015 f/u ov/Anita Murphy re: GOLD III copd spiriva/ symbicort Chief Complaint  Patient presents with  . Follow-up    Pt states her breathing is about the same. Some worse today "when I realized I was late".  She uses xopenex 1-2 x per day on average.   Uses xopenex around 1230 pm ,most every day not clear why No change doe x mb and back  Can't afford spiriva per insurance but doesn't know alternatives from formulary  rec Plan A = automatic = symbicort 160 x 2 puffs  then followed by tudorza x 1 puff and do this twice daily Plan B= Only use your xopenex  as a rescue medication  Work on inhaler technique    Expand All Collapse All     Date of Admission: 8/3/2016Date of Discharge: 07/24/2015    Indication for Hospitalization:  Acute respiratory distress in setting of COPD exacerbation  Discharge Diagnoses/Problem List:   Patient Active Problem List   Diagnosis Date Noted  . Acute on chronic respiratory failure 07/22/2015  . COPD exacerbation 07/22/2015  . Severe protein-calorie malnutrition 07/22/2015  . Elevated blood pressure 07/22/2015  . Routine general medical examination at a health care facility 12/07/2012  . Need for prophylactic vaccination and inoculation against influenza 12/07/2012  . Routine gynecological examination 12/07/2012  . Hypoxia 12/07/2012  . COPD III 12/07/2012  . Weight loss 12/07/2012  . Stress reaction 12/07/2012  . Hordeolum 12/07/2012     Disposition: Home  Discharge Condition: Stable  Discharge Exam:   Temp: [98.1 F (36.7 C)-98.8 F (37.1 C)] 98.1 F (36.7 C) (08/06 0657) Pulse Rate: [78-95] 80 (08/06 0657) Resp: [18-20] 20 (08/06 0657) BP: (96-158)/(50-90) 103/59 mmHg (08/06 0657) SpO2: [91 %-98 %] 96 % (08/06 0746) Weight: [83 lb (37.649 kg)] 83 lb (37.649 kg) (08/06 0657) Physical Exam: General: NAD Cardiovascular: Mild tachycardia, regular rhythm, no murmurs Respiratory: distant breath sounds but clear, air movement throughout, increased WOB off O2, that improves with [place] Abdomen: soft, non tender, non distended Extremities: WWP, no edema  Brief Hospital Course:  74 y/o F with PMH significant for COPD, presented to the ED with respiratory distress concerning for a COPD exacerbation On admission she had a chest xray which was negative for  acute process but chronic COPD changes. HEr admission WBC was 7.1 and her BNP was 65.  She was started on supplemental O2, solumedrol, and continued on her home COPD regimen. On HD2 she was transferred to the family medicine teaching service and was started on doxycline to complete a 5 day course. Her oxygen was weaned as tolerated until this requirement resolved.  On day of discharge her prednisone ( D4 of steroids) was discontinued and was she was transitioned to decadron  20 mg once. Additionally while inpatient her symbicort MDI was transitioned to nebulized pulmacort as there was concern that she was air trapping and ineffectually using the MDI. Her breathing improved wit this therapy, so a prescription for a nebulizer was given to her daughter to fill on 8/5 in anticipation of discharge.  She clinically improved by day of discharged with improved respiratory status and no oxygen requirement            08/31/2015 post hosp f/u ov/Anita Murphy re: GOLD III criteria/ 2lpm 02 24/7   Chief Complaint  Patient presents with  . Follow-up    Pt c/o increased SOB since the last visit here in April 2016.     24/7 02 2lpm x walk slowly room / no cough/ fm very confused with meds / names/ B vs D medicare rules rec Plan A = automatic = Stop atrovent and start Tudorza one twice daily  Plus in nebulizer:  budesonide and perforomist twice daily  Pan B = Backup  If Can't get your breath, use neb  levoalbuterol every 4 hours and with budesonide twice daily if not able to get performoist rx filled by [facility] Under Medicare B All of your solutions for your nebulizer are Part B and all the others are part D    10/16/2015  f/u ov/Anita Murphy re: COPD III criteria/ 02 dep 2lpm 24/7  Chief Complaint  Patient presents with  . Follow-up    Pt states her breathing has improved. She has had one "attack" in Sept 2016.      Neb ipatropium qid  / 2lpm 24/7 / perf/bud bid Room to room at home    No obvious daytime variabilty or assoc chronic cough or cp or chest tightness, subjective wheeze overt sinus or hb symptoms. No unusual other exposure hx or h/o childhood pna/ asthma or knowledge of premature birth.   Sleeping ok without nocturnal  or early am exacerbation  of respiratory  c/o's or need for noct saba. Also denies any obvious fluctuation of symptoms with weather or environmental changes or other aggravating or alleviating factors except as outlined above  .   Current Medications,  Allergies, Past Medical History, Past Surgical History, Family History, and Social History were reviewed in [employer] record.  ROS  The following are not active complaints unless bolded sore throat, dysphagia, dental problems, itching, sneezing,  nasal congestion or excess/ purulent secretions, ear ache,   fever, chills, sweats, unintended wt loss reversed, pleuritic or exertional cp, hemoptysis,  orthopnea pnd or leg swelling, presyncope, palpitations, heartburn, abdominal pain, anorexia, nausea, vomiting, diarrhea  or change in bowel or urinary habits, change in stools or urine, dysuria,hematuria,  rash, arthralgias, visual complaints, headache, numbness weakness or ataxia or problems with walking or coordination,  change in mood/affect or memory.          Objective:   Physical Exam  W/c bound bf nad/ vital signs reviewed  10/16/2015     90 08/31/2015  87  03/23/2015          84  10/21/2014        87  11/19/2013       104  08/21/2013         100  Wt Readings from Last 3 Encounters:  05/31/13 106 lb (48.081 kg)  12/05/12 104 lb (47.174 kg)  10/20/11 121 lb (54.885 kg)    HEENT mild turbinate edema.  Oropharynx no thrush or excess pnd or cobblestoning.  No JVD or cervical adenopathy. Mild accessory muscle hypertrophy. Trachea midline, nl thryroid. Chest was hyperinflated by percussion with diminished breath sounds and moderate increased exp time without wheeze. Hoover sign positive at mid inspiration. Regular rate and rhythm without murmur gallop or rub or increase P2 or edema.  Abd: no hsm, nl excursion. Ext warm without cyanosis or clubbing.       I personally reviewed images and agree with radiology impression as follows:  CXR:  07/22/15 COPD changes. Question pulmonary arterial hypertension. No acute abnormalities.         Assessment & Plan:

## 2015-10-17 ENCOUNTER — Encounter: Payer: Self-pay | Admitting: Internal Medicine

## 2015-10-17 NOTE — Assessment & Plan Note (Signed)
-  Spirometry 12/05/12 FEV1  0.51 (30% ratio 40    - PFT's  08/21/2013   FEV1  0.52 ( 33%) ratio 42 and no change B2 - 08/31/2015 p extensive coaching HFA effectiveness =  HFA  0/  90% with dpi > rec tudorza and pulm/perforomist bid neb - could not afford turdorza so changed to qid atrovent 09/19/15   Patient is stabilized on her present regimen which consists of a LABA  and an ICS along with 4 times a day SAMA. She cannot use HFA at all and cannot afford dpi so the really is nothing else to offer at this point.  I had an extended discussion with the patient and her daughter reviewing all relevant studies completed to date and  lasting 15 to 20 minutes of a 25 minute visit    Each maintenance medication was reviewed in detail including most importantly the difference between maintenance and prns and under what circumstances the prns are to be triggered using an action plan format that is not reflected in the computer generated alphabetically organized AVS.    Please see instructions for details which were reviewed in writing and the patient given a copy highlighting the part that I personally wrote and discussed at today's ov.

## 2015-10-17 NOTE — Assessment & Plan Note (Signed)
[facility]   Adequate control on present rx, reviewed > no change in rx needed

## 2015-11-02 ENCOUNTER — Telehealth: Payer: Self-pay | Admitting: Internal Medicine

## 2015-11-02 ENCOUNTER — Ambulatory Visit (INDEPENDENT_AMBULATORY_CARE_PROVIDER_SITE_OTHER): Payer: Medicare Other | Admitting: Family Medicine

## 2015-11-02 ENCOUNTER — Encounter: Payer: Self-pay | Admitting: Family Medicine

## 2015-11-02 VITALS — BP 154/75 | HR 90 | Temp 98.3°F | Resp 16 | Ht 64.0 in | Wt 91.4 lb

## 2015-11-02 DIAGNOSIS — IMO0001 Reserved for inherently not codable concepts without codable children: Secondary | ICD-10-CM

## 2015-11-02 DIAGNOSIS — J9611 Chronic respiratory failure with hypoxia: Secondary | ICD-10-CM

## 2015-11-02 DIAGNOSIS — J441 Chronic obstructive pulmonary disease with (acute) exacerbation: Secondary | ICD-10-CM | POA: Diagnosis not present

## 2015-11-02 DIAGNOSIS — R030 Elevated blood-pressure reading, without diagnosis of hypertension: Secondary | ICD-10-CM

## 2015-11-02 DIAGNOSIS — R634 Abnormal weight loss: Secondary | ICD-10-CM

## 2015-11-02 DIAGNOSIS — E43 Unspecified severe protein-calorie malnutrition: Secondary | ICD-10-CM | POA: Diagnosis not present

## 2015-11-02 MED ORDER — MIRTAZAPINE 7.5 MG PO TABS: 7.5000 mg | ORAL_TABLET | Freq: Every day | ORAL | Status: DC

## 2015-11-02 MED ORDER — HYDROCORTISONE 2.5 % EX OINT: TOPICAL_OINTMENT | Freq: Two times a day (BID) | CUTANEOUS | Status: DC

## 2015-11-02 NOTE — Progress Notes (Addendum)
Subjective:    Patient ID: Anita Murphy, female    DOB: [birth year], 74 y.o.   MRN: [id number]  11/02/2015  Follow-up   HPI This 74 y.o. female presents for two month follow-up andevaluation of the following:   1.  Weight loss: weight today 91; last visit weight 87 on 08/31/15.  Taking Remeron 7.5mg  daily with ongoing increase in appetite.  Weight up four pounds from two months ago.  Cleaning plate at each meal.  B:  Eggs (1), pork chops, toast, coffee, glass milk whole, grapes Snack: oatmeal cake or nuts or BP sandwich, Ensure. Lunch:  Baked potatoe, fried chicken, juice. Snack:  Protein supplement or Ensure live; Boost; ice cream. Chocolate. Bagels Supper:  Fish, vegetables, greens, macaroni n cheese, tea or water or punch or soda. Snack:  Boost or ice cream.   2. COPD: doing well.  s/p follow-up with Wert 10/16/15; COPD II criteria; 02 dependent 2 liters [place] 24/7. Stabilized on current regimen of LABA adn ICS along with qid SAMA; cannot use HFA at all; cannot afford DPI. Ipratropium/atrovent four times per day. Performist and budesonide twice daily. One episode since last visit; had to use bathroom. No labored breathing.   Two years of labored breathing prior to August 2016. Breathing drastically declined in 2013.   Anxiety worsens breathing. Weight loss started in 2014.     Family is not leaving patient alone at all at this point.    3.  Health maintenance: overdue for colonoscopy, Zostavax, Tetanus, Mammogram.  Patient not ready to make a decision on colonoscopy/hemoccult cards or mammogram; will consider.  4. Hyperpigmented skin lesions: onset one year ago.  Onset R elbow; extensive; also some in distal L hand.  Two small round lesions on face that are new and mild.  Skin dark and almost black pigmentation. Skin dry.  No itching, pain, drainage; no pustules or vesicles.  No mucosal involvement.    5. Constipation: Miralax qod; doing well.     Review of Systems    Constitutional: Negative for fever, chills, diaphoresis and fatigue.  Eyes: Negative for visual disturbance.  Respiratory: Positive for shortness of breath. Negative for cough, choking, wheezing and stridor.   Cardiovascular: Negative for chest pain, palpitations and leg swelling.  Gastrointestinal: Positive for constipation. Negative for nausea, vomiting, abdominal pain and diarrhea.  Endocrine: Negative for cold intolerance, heat intolerance, polydipsia, polyphagia and polyuria.  Skin: Positive for color change.  Neurological: Negative for dizziness, tremors, seizures, syncope, facial asymmetry, speech difficulty, weakness, light-headedness, numbness and headaches.  Psychiatric/Behavioral: Negative for suicidal ideas, sleep disturbance, self-injury and dysphoric mood. The patient is not nervous/anxious.     Past Medical History  Diagnosis Date  . Other diseases of lung, not elsewhere classified   . Allergic rhinitis, cause unspecified   . Personal history of tobacco use, presenting hazards to health   . Elevated blood pressure reading without diagnosis of hypertension   . Chronic airway obstruction, not elsewhere classified   . Other (abnormal) findings on radiological examination of breast    Past Surgical History  Procedure Laterality Date  . Breast surgery Left     4-5 YEARS AGO  . Tubal ligation  1969   Not on File Current Outpatient Prescriptions  Medication Sig Dispense Refill  . aspirin 81 MG chewable tablet Chew 81 mg by mouth daily.    . budesonide (PULMICORT) 0.25 MG/2ML nebulizer solution Take 2 mLs (0.25 mg total) by nebulization 2 (two) times daily. DX: J96.11; J44.9  60 mL 12  . feeding supplement, ENSURE ENLIVE, (ENSURE ENLIVE) LIQD Take 237 mLs by mouth 2 (two) times daily between meals. 237 mL 12  . formoterol (PERFOROMIST) 20 MCG/2ML nebulizer solution Take 2 mLs (20 mcg total) by nebulization 2 (two) times daily. Use in nebulizer twice daily perfectly regularly   DX: J96.11; J44.9 120 mL 11  . levalbuterol (XOPENEX HFA) 45 MCG/ACT inhaler Inhale 1-2 puffs into the lungs every 4 (four) hours as needed for wheezing. 1 Inhaler 3  . levalbuterol (XOPENEX) 1.25 MG/3ML nebulizer solution Take 0.63 mg by nebulization every 4 (four) hours as needed for wheezing. DX: J96.11; J44.9 72 mL 12  . polyethylene glycol powder (GLYCOLAX/MIRALAX) powder MIX 17GM UTD AND TK  PO DAILY  11  . Aclidinium Bromide (TUDORZA PRESSAIR) 400 MCG/ACT AEPB Inhale 1 puff into the lungs 2 (two) times daily. One twice daily (Patient not taking: Reported on 11/02/2015) 3 each 3  . hydrocortisone 2.5 % ointment Apply topically 2 (two) times daily. 30 g 1  . mirtazapine (REMERON) 7.5 MG tablet Take 1 tablet (7.5 mg total) by mouth at bedtime. 90 tablet 3   No current facility-administered medications for this visit.   Social History   Social History  . Marital Status: Married    Spouse Name: N/A  . Number of Children: 4  . Years of Education: 12   Occupational History  . 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 Smoking status: Former Smoker -- 1.00 packs/day for 30 years    Types: Cigarettes    Quit date: 12/19/2002  . Smokeless tobacco: Not on file  . Alcohol Use: No  . Drug Use: No  . Sexual Activity: No   Other Topics Concern  . Not on file   Social History Narrative   Always uses seat belts. Smoke alarm and carbon monoxide detector in the home. Guns in the home stored in locked cabinet.       Marital status:  Widowed; married x 52 yrs, happy, no abuse.  Husband with lung cancer and colon cancer.      Lives with one daughter, brother, and grandson.       Exercise: none       Caffeine use: Coffee 2 servings per day.      Employment: retired      ADLs: independent with all ADLs.  No assistant devices for ambulation.  Washes clothes, cooks.        Advanced Directives:  None; DNR/DNI in 2016.   Family History  Problem Relation Age of  Onset  . Lung disease Sister   . Hypothyroidism Mother   . Aortic stenosis Mother   . Dementia Mother   . COPD Sister     smoker  . Heart disease Father   . Mesothelioma Father     asbestos exp  . Cancer Father     LUNG       Objective:    BP 154/75 mmHg  Pulse 90  Temp(Src) 98.3 F (36.8 C) (Oral)  Resp 16  Ht  (1.626 m)  Wt 91 lb 6.4 oz (41.459 kg)  BMI 15.68 kg/m2 Physical Exam  Constitutional: She is oriented to person, place, and time. She appears well-developed and well-nourished. No distress.  Sitting in wheelchair with oxygen portable.  HENT:  Head: Normocephalic and atraumatic.  Right Ear: External ear normal.  Left Ear: External ear normal.  Nose: Nose  normal.  Mouth/Throat: Oropharynx is clear and moist.  Eyes: Conjunctivae and EOM are normal. Pupils are equal, round, and reactive to light.  Neck: Normal range of motion. Neck supple. Carotid bruit is not present. No thyromegaly present.  Cardiovascular: Normal rate, regular rhythm, normal heart sounds and intact distal pulses.  Exam reveals no gallop and no friction rub.   No murmur heard. Pulmonary/Chest: Effort normal and breath sounds normal. She has no wheezes. She has no rales.  Abdominal: Soft. Bowel sounds are normal. She exhibits no distension and no mass. There is no tenderness. There is no rebound and no guarding.  Lymphadenopathy:    She has no cervical adenopathy.  Neurological: She is alert and oriented to person, place, and time. No cranial nerve deficit.  Skin: Skin is warm and dry. No rash noted. She is not diaphoretic. No erythema. No pallor.  R elbow with areas of hyperpigmentation; large areas of involvement approximately 5cm x 2 cm; similar lesions along distal forearms round with black hyperpigmentation; diffusely dry skin.  Two milder lesions on perioral area 1 cm diameter.  Psychiatric: She has a normal mood and affect. Her behavior is normal.   Results for orders placed or  performed during the hospital encounter of 07/22/15  Basic metabolic panel  Result Value Ref Range   Sodium 142 135 - 145 mmol/L   Potassium 4.2 3.5 - 5.1 mmol/L   Chloride 107 101 - 111 mmol/L   CO2 26 22 - 32 mmol/L   Glucose, Bld 99 65 - 99 mg/dL   BUN 8 6 - 20 mg/dL   Creatinine, Ser 4.090.60 0.44 - 1.00 mg/dL   Calcium 8.5 (L) 8.9 - 10.3 mg/dL   GFR calc non Af Amer >60 >60 mL/min   GFR calc Af Amer >60 >60 mL/min   Anion gap 9 5 - 15  CBC with Differential/Platelet  Result Value Ref Range   WBC 7.1 4.0 - 10.5 K/uL   RBC 4.94 3.87 - 5.11 MIL/uL   Hemoglobin 13.4 12.0 - 15.0 g/dL   HCT 81.142.2 91.436.0 - 78.246.0 %   MCV 85.4 78.0 - 100.0 fL   MCH 27.1 26.0 - 34.0 pg   MCHC 31.8 30.0 - 36.0 g/dL   RDW 95.613.8 21.311.5 - 08.615.5 %   Platelets 231 150 - 400 K/uL   Neutrophils Relative % 65 43 - 77 %   Neutro Abs 4.7 1.7 - 7.7 K/uL   Lymphocytes Relative 26 12 - 46 %   Lymphs Abs 1.8 0.7 - 4.0 K/uL   Monocytes Relative 8 3 - 12 %   Monocytes Absolute 0.6 0.1 - 1.0 K/uL   Eosinophils Relative 0 0 - 5 %   Eosinophils Absolute 0.0 0.0 - 0.7 K/uL   Basophils Relative 1 0 - 1 %   Basophils Absolute 0.0 0.0 - 0.1 K/uL  Brain natriuretic peptide  Result Value Ref Range   B Natriuretic Peptide 65.9 0.0 - 100.0 pg/mL  Comprehensive metabolic panel  Result Value Ref Range   Sodium 141 135 - 145 mmol/L   Potassium 5.1 3.5 - 5.1 mmol/L   Chloride 106 101 - 111 mmol/L   CO2 25 22 - 32 mmol/L   Glucose, Bld 144 (H) 65 - 99 mg/dL   BUN 12 6 - 20 mg/dL   Creatinine, Ser 5.780.59 0.44 - 1.00 mg/dL   Calcium 9.0 8.9 - 46.910.3 mg/dL   Total Protein 6.4 (L) 6.5 - 8.1 g/dL   Albumin 3.6  3.5 - 5.0 g/dL   AST 34 15 - 41 U/L   ALT 29 14 - 54 U/L   Alkaline Phosphatase 39 38 - 126 U/L   Total Bilirubin 0.5 0.3 - 1.2 mg/dL   GFR calc non Af Amer >60 >60 mL/min   GFR calc Af Amer >60 >60 mL/min   Anion gap 10 5 - 15  CBC  Result Value Ref Range   WBC 4.6 4.0 - 10.5 K/uL   RBC 4.95 3.87 - 5.11 MIL/uL   Hemoglobin  13.3 12.0 - 15.0 g/dL   HCT 13.0 86.5 - 78.4 %   MCV 85.7 78.0 - 100.0 fL   MCH 26.9 26.0 - 34.0 pg   MCHC 31.4 30.0 - 36.0 g/dL   RDW 69.6 29.5 - 28.4 %   Platelets 239 150 - 400 K/uL  I-Stat Troponin, ED (not at [facility])  Result Value Ref Range   Troponin i, poc 0.01 0.00 - 0.08 ng/mL   Comment 3          I-Stat arterial blood gas, ED  Result Value Ref Range   pH, Arterial 7.312 (L) 7.350 - 7.450   pCO2 arterial 51.5 (H) 35.0 - 45.0 mmHg   pO2, Arterial 85.0 80.0 - 100.0 mmHg   Bicarbonate 26.1 (H) 20.0 - 24.0 mEq/L   TCO2 28 0 - 100 mmol/L   O2 Saturation 95.0 %   Acid-base deficit 1.0 0.0 - 2.0 mmol/L   Patient temperature 98.6 F    Collection site RADIAL, ALLEN'S TEST ACCEPTABLE    Drawn by RT    Sample type ARTERIAL        Assessment & Plan:   1. Weight loss   2. Severe protein-calorie malnutrition (HCC)   3. Elevated blood pressure   4. COPD exacerbation (HCC)   5. Chronic respiratory failure with hypoxia (HCC)     1. Weight loss unintentional: improving with Remeron qhs; continue with Ensure or other nutritional supplement.  Goal weight of 100 pounds. 2.  Severe protein calorie malnutrition: improving; continue to follow closely. 3.  Elevated blood pressure without dx: borderline today; continue to monitor; may warrant medication if continues to gain weight. 4.  COPD: stable; s/p recent follow-up with pulmonology.  5.  Rash/hyperpigmentation: New. Etiology unclear.  Treat empirically with Hydrocortisone ointment 2.5% bid. If no improvement, will warrant bx. 6. Health maintenance: pt refusing colonoscopy/hemosures and mammogram at this time.  No orders of the defined types were placed in this encounter.   Meds ordered this encounter  Medications  . hydrocortisone 2.5 % ointment    Sig: Apply topically 2 (two) times daily.    Dispense:  30 g    Refill:  1  . mirtazapine (REMERON) 7.5 MG tablet    Sig: Take 1 tablet (7.5 mg total) by mouth at bedtime.     Dispense:  90 tablet    Refill:  3    Return in about 3 months (around 02/02/2016) for recheck weight.     Skyanne Welle Paulita Fujita, M.D. [facility] [address] [place], [place]  [address] [phone] phone [phone] fax

## 2015-11-02 NOTE — Telephone Encounter (Signed)
Ok with me 

## 2015-11-02 NOTE — Telephone Encounter (Signed)
Orders sent to [facility]

## 2015-11-02 NOTE — Telephone Encounter (Signed)
[facility]  She says that patient was seen by Dr. Katrinka BlazingSmith today and came by their office to get an order for OCD Titration and POC.  [place] [facility] [place].  She said that she just needs an Order for OCD Titration and POC order.  Dr. Sherene SiresWert - ok to place order?

## 2015-11-03 ENCOUNTER — Telehealth: Payer: Self-pay | Admitting: Family Medicine

## 2015-11-03 NOTE — Telephone Encounter (Signed)
Dr Smith, please see staff message, copied below, concernKatrinka Blazinging pt's O2. OK to order these for pt? Dx codes J44.1 and J96.11?  Actually, it looks like Dr Sherene SiresWert already did this. I will send [facility] to make sure all is complete for pt.

## 2015-11-03 NOTE — Telephone Encounter (Signed)
-----   Message from Dene GentryMandy S Wooten sent at 11/02/2015 12:19 PM EST ----- Dr Katrinka BlazingSmith,  [facility] [place] [facility].  She discussed with me about wanting a portable oxygen concentrator.  Can you put in an order in epic for an OCD titration and order for a portable oxygen concentrator?  Our therapist can go out and access Ms Effie ShyColeman for the best option for her.  [facility] can pull the order in epic once it is in the system.  Thanks so much!  [facility]andy at [facility] [phone] cell

## 2015-11-03 NOTE — Telephone Encounter (Signed)
Please call [facility] for patient --- she is still waiting for [facility] to provide with portable oxygen tank options.  The patient is waiting on follow-up from [facility].

## 2015-11-03 NOTE — Telephone Encounter (Signed)
Sent Ouida SillsMandy Wooten a staff message.

## 2015-11-04 NOTE — Telephone Encounter (Signed)
    Lysle DingwallMandy S Wooten  Cydney [place] N Pantelis [place], [place]           Delaney Meigsamara,   I went through Dr Sherene SiresWert at pulmonary for this patient, we are good to go, thanks!   [facility]

## 2015-11-07 NOTE — Telephone Encounter (Signed)
Form revised and faxed

## 2015-11-09 ENCOUNTER — Telehealth: Payer: Self-pay | Admitting: Family Medicine

## 2015-11-09 NOTE — Telephone Encounter (Signed)
Serious health condition form completed by Dr. Katrinka BlazingSmith. Faxed and scanned

## 2015-12-16 ENCOUNTER — Telehealth: Payer: Self-pay | Admitting: Internal Medicine

## 2015-12-16 ENCOUNTER — Telehealth: Payer: Self-pay

## 2015-12-16 NOTE — Telephone Encounter (Signed)
[facility] Perfor[facility]ist. Key: [id number]  Status: Sent to Plan on December 28th, 2016  The plan will fax you a determination, typically within 1 to 5 business days.  Drug: Perforomist 20MCG/2ML nebulizer solution  Form: Prior Authorization for Beta-2 Adrenergic Agonists and Anticholinergics  Phone: [phone]  Fax: [phone]  Awaiting decision. Will send to MA for follow up

## 2015-12-16 NOTE — Telephone Encounter (Signed)
Talbert ForestShirley called and was concerned about Shiva saying she sounds very weak on the phone. They want someone to talk to the sister to see if she needs to be seen or possible advice over the phone.  Best contact number is   [phone]

## 2015-12-17 NOTE — Telephone Encounter (Signed)
Spoke with pt's sister. She states she is fine.

## 2015-12-18 NOTE — Telephone Encounter (Signed)
Checked cover my meds. No decision has been made yet. 

## 2015-12-22 NOTE — Telephone Encounter (Signed)
Checked status of PA through [email or web address], no decision has been made yet.  Will continue to follow up on.

## 2015-12-23 NOTE — Telephone Encounter (Signed)
Checked CMM and PA still in process.  

## 2015-12-24 NOTE — Telephone Encounter (Signed)
PA is still pending.  

## 2015-12-25 NOTE — Telephone Encounter (Signed)
Checked status of PA, states outcome is unknown and has been archived. Misty please advise if you've received any paperwork on this pt, or if we need to contact pt's plan for follow up.  Thanks!

## 2015-12-25 NOTE — Telephone Encounter (Signed)
[facility]ita [facility]le [facility]. I have been checking status and keeps showing pending. Thanks

## 2015-12-29 NOTE — Telephone Encounter (Signed)
Contacted [facility]. They state Perforomist has been approved until 12/17/2018. Ref # [id number] Pt ID: [id number].  Pharmacy, [facility] informed.

## 2016-01-11 ENCOUNTER — Other Ambulatory Visit: Payer: Self-pay | Admitting: Family Medicine

## 2016-01-11 ENCOUNTER — Ambulatory Visit (INDEPENDENT_AMBULATORY_CARE_PROVIDER_SITE_OTHER): Payer: Medicare PPO

## 2016-01-11 ENCOUNTER — Emergency Department (HOSPITAL_COMMUNITY): Payer: Medicare PPO

## 2016-01-11 ENCOUNTER — Ambulatory Visit (INDEPENDENT_AMBULATORY_CARE_PROVIDER_SITE_OTHER): Payer: Medicare Other | Admitting: Family Medicine

## 2016-01-11 ENCOUNTER — Encounter (HOSPITAL_COMMUNITY): Payer: Self-pay | Admitting: *Deleted

## 2016-01-11 ENCOUNTER — Inpatient Hospital Stay (HOSPITAL_COMMUNITY)
Admission: EM | Admit: 2016-01-11 | Discharge: 2016-01-14 | DRG: 190 | Disposition: A | Discharge status: Home / Self Care | Payer: Medicare PPO | Attending: Internal Medicine | Admitting: Internal Medicine

## 2016-01-11 VITALS — BP 130/68 | HR 76 | Temp 98.1°F | Resp 22 | Ht 62.0 in | Wt 85.0 lb

## 2016-01-11 DIAGNOSIS — Z681 Body mass index (BMI) 19 or less, adult: Secondary | ICD-10-CM | POA: Diagnosis not present

## 2016-01-11 DIAGNOSIS — J9621 Acute and chronic respiratory failure with hypoxia: Secondary | ICD-10-CM

## 2016-01-11 DIAGNOSIS — I10 Essential (primary) hypertension: Secondary | ICD-10-CM | POA: Diagnosis present

## 2016-01-11 DIAGNOSIS — R0902 Hypoxemia: Secondary | ICD-10-CM

## 2016-01-11 DIAGNOSIS — Z853 Personal history of malignant neoplasm of breast: Secondary | ICD-10-CM | POA: Diagnosis not present

## 2016-01-11 DIAGNOSIS — Z9981 Dependence on supplemental oxygen: Secondary | ICD-10-CM

## 2016-01-11 DIAGNOSIS — Z87891 Personal history of nicotine dependence: Secondary | ICD-10-CM

## 2016-01-11 DIAGNOSIS — E43 Unspecified severe protein-calorie malnutrition: Secondary | ICD-10-CM | POA: Diagnosis not present

## 2016-01-11 DIAGNOSIS — J181 Lobar pneumonia, unspecified organism: Secondary | ICD-10-CM | POA: Diagnosis present

## 2016-01-11 DIAGNOSIS — Z7982 Long term (current) use of aspirin: Secondary | ICD-10-CM | POA: Diagnosis not present

## 2016-01-11 DIAGNOSIS — I248 Other forms of acute ischemic heart disease: Secondary | ICD-10-CM | POA: Diagnosis present

## 2016-01-11 DIAGNOSIS — J441 Chronic obstructive pulmonary disease with (acute) exacerbation: Secondary | ICD-10-CM | POA: Diagnosis present

## 2016-01-11 DIAGNOSIS — J440 Chronic obstructive pulmonary disease with acute lower respiratory infection: Secondary | ICD-10-CM | POA: Diagnosis present

## 2016-01-11 DIAGNOSIS — R0602 Shortness of breath: Secondary | ICD-10-CM | POA: Diagnosis present

## 2016-01-11 DIAGNOSIS — R64 Cachexia: Secondary | ICD-10-CM | POA: Diagnosis present

## 2016-01-11 DIAGNOSIS — J449 Chronic obstructive pulmonary disease, unspecified: Secondary | ICD-10-CM | POA: Diagnosis not present

## 2016-01-11 DIAGNOSIS — R5383 Other fatigue: Secondary | ICD-10-CM

## 2016-01-11 DIAGNOSIS — I509 Heart failure, unspecified: Secondary | ICD-10-CM | POA: Diagnosis not present

## 2016-01-11 DIAGNOSIS — I272 Other secondary pulmonary hypertension: Secondary | ICD-10-CM | POA: Diagnosis present

## 2016-01-11 DIAGNOSIS — J9620 Acute and chronic respiratory failure, unspecified whether with hypoxia or hypercapnia: Secondary | ICD-10-CM

## 2016-01-11 DIAGNOSIS — J189 Pneumonia, unspecified organism: Secondary | ICD-10-CM | POA: Diagnosis present

## 2016-01-11 LAB — CBC WITH DIFFERENTIAL/PLATELET
BASOS ABS: 0 10*3/uL (ref 0.0–0.1)
BASOS PCT: 0 %
EOS ABS: 0 10*3/uL (ref 0.0–0.7)
Eosinophils Relative: 0 %
HCT: 40.4 % (ref 36.0–46.0)
Hemoglobin: 12.5 g/dL (ref 12.0–15.0)
Lymphocytes Relative: 6 %
Lymphs Abs: 1.2 10*3/uL (ref 0.7–4.0)
MCH: 27 pg (ref 26.0–34.0)
MCHC: 30.9 g/dL (ref 30.0–36.0)
MCV: 87.3 fL (ref 78.0–100.0)
MONO ABS: 1.6 10*3/uL — AB (ref 0.1–1.0)
MONOS PCT: 8 %
NEUTROS ABS: 18.1 10*3/uL — AB (ref 1.7–7.7)
Neutrophils Relative %: 87 %
PLATELETS: 236 10*3/uL (ref 150–400)
RBC: 4.63 MIL/uL (ref 3.87–5.11)
RDW: 12.9 % (ref 11.5–15.5)
WBC: 20.9 10*3/uL — ABNORMAL HIGH (ref 4.0–10.5)

## 2016-01-11 LAB — I-STAT ARTERIAL BLOOD GAS, ED
ACID-BASE EXCESS: 5 mmol/L — AB (ref 0.0–2.0)
Bicarbonate: 32.4 mEq/L — ABNORMAL HIGH (ref 20.0–24.0)
O2 SAT: 95 %
PCO2 ART: 60.5 mmHg — AB (ref 35.0–45.0)
PH ART: 7.337 — AB (ref 7.350–7.450)
Patient temperature: 98.5
TCO2: 34 mmol/L (ref 0–100)
pO2, Arterial: 82 mmHg (ref 80.0–100.0)

## 2016-01-11 LAB — I-STAT TROPONIN, ED: Troponin i, poc: 0.26 ng/mL (ref 0.00–0.08)

## 2016-01-11 LAB — MRSA PCR SCREENING: MRSA BY PCR: NEGATIVE

## 2016-01-11 LAB — EXPECTORATED SPUTUM ASSESSMENT W REFEX TO RESP CULTURE

## 2016-01-11 LAB — INFLUENZA PANEL BY PCR (TYPE A & B)
H1N1 flu by pcr: NOT DETECTED
INFLAPCR: NEGATIVE
INFLBPCR: NEGATIVE

## 2016-01-11 LAB — CBC
HCT: 38.1 % (ref 36.0–46.0)
Hemoglobin: 11.7 g/dL — ABNORMAL LOW (ref 12.0–15.0)
MCH: 26.5 pg (ref 26.0–34.0)
MCHC: 30.7 g/dL (ref 30.0–36.0)
MCV: 86.4 fL (ref 78.0–100.0)
Platelets: 224 10*3/uL (ref 150–400)
RBC: 4.41 MIL/uL (ref 3.87–5.11)
RDW: 12.8 % (ref 11.5–15.5)
WBC: 19.7 10*3/uL — AB (ref 4.0–10.5)

## 2016-01-11 LAB — COMPREHENSIVE METABOLIC PANEL
ALBUMIN: 3.7 g/dL (ref 3.5–5.0)
ALT: 15 U/L (ref 14–54)
ANION GAP: 13 (ref 5–15)
AST: 33 U/L (ref 15–41)
Alkaline Phosphatase: 47 U/L (ref 38–126)
CHLORIDE: 93 mmol/L — AB (ref 101–111)
CO2: 30 mmol/L (ref 22–32)
Calcium: 9.3 mg/dL (ref 8.9–10.3)
Creatinine, Ser: 0.48 mg/dL (ref 0.44–1.00)
GFR calc Af Amer: >60 mL/min (ref >60)
GFR calc non Af Amer: >60 mL/min (ref >60)
GLUCOSE: 146 mg/dL — AB (ref 65–99)
POTASSIUM: 3.6 mmol/L (ref 3.5–5.1)
SODIUM: 136 mmol/L (ref 135–145)
TOTAL PROTEIN: 7.4 g/dL (ref 6.5–8.1)
Total Bilirubin: 0.7 mg/dL (ref 0.3–1.2)

## 2016-01-11 LAB — I-STAT CG4 LACTIC ACID, ED
LACTIC ACID, VENOUS: 2.9 mmol/L — AB (ref 0.5–2.0)
Lactic Acid, Venous: 4.06 mmol/L (ref 0.5–2.0)

## 2016-01-11 LAB — CREATININE, SERUM: CREATININE: 0.59 mg/dL (ref 0.44–1.00)

## 2016-01-11 LAB — EXPECTORATED SPUTUM ASSESSMENT W GRAM STAIN, RFLX TO RESP C

## 2016-01-11 LAB — TROPONIN I
TROPONIN I: 0.33 ng/mL — AB (ref <0.031)
Troponin I: 0.31 ng/mL — ABNORMAL HIGH (ref <0.031)

## 2016-01-11 LAB — BRAIN NATRIURETIC PEPTIDE: B Natriuretic Peptide: 318.3 pg/mL — ABNORMAL HIGH (ref 0.0–100.0)

## 2016-01-11 MED ORDER — ENSURE ENLIVE PO LIQD
237.0000 mL | Freq: Two times a day (BID) | ORAL | Status: DC
  Administered 2016-01-12 – 2016-01-14 (×5): 237 mL via ORAL

## 2016-01-11 MED ORDER — ASPIRIN 81 MG PO CHEW
324.0000 mg | CHEWABLE_TABLET | Freq: Once | ORAL | Status: AC
  Administered 2016-01-11: 324 mg via ORAL
  Filled 2016-01-11: qty 4

## 2016-01-11 MED ORDER — ALBUTEROL (5 MG/ML) CONTINUOUS INHALATION SOLN
10.0000 mg/h | INHALATION_SOLUTION | Freq: Once | RESPIRATORY_TRACT | Status: AC
  Administered 2016-01-11: 10 mg/h via RESPIRATORY_TRACT
  Filled 2016-01-11: qty 20

## 2016-01-11 MED ORDER — ARFORMOTEROL TARTRATE 15 MCG/2ML IN NEBU
15.0000 ug | INHALATION_SOLUTION | Freq: Two times a day (BID) | RESPIRATORY_TRACT | Status: DC
  Administered 2016-01-12 – 2016-01-14 (×5): 15 ug via RESPIRATORY_TRACT
  Filled 2016-01-11 (×6): qty 2

## 2016-01-11 MED ORDER — AZITHROMYCIN 500 MG IV SOLR
500.0000 mg | Freq: Once | INTRAVENOUS | Status: AC
  Administered 2016-01-11: 500 mg via INTRAVENOUS
  Filled 2016-01-11 (×2): qty 500

## 2016-01-11 MED ORDER — ALBUTEROL SULFATE (2.5 MG/3ML) 0.083% IN NEBU
2.5000 mg | INHALATION_SOLUTION | RESPIRATORY_TRACT | Status: DC | PRN
  Administered 2016-01-11: 2.5 mg via RESPIRATORY_TRACT
  Filled 2016-01-11: qty 3

## 2016-01-11 MED ORDER — ONDANSETRON HCL 4 MG/2ML IJ SOLN: 4.0000 mg | Freq: Four times a day (QID) | INTRAMUSCULAR | Status: DC | PRN

## 2016-01-11 MED ORDER — ALBUTEROL SULFATE (2.5 MG/3ML) 0.083% IN NEBU
2.5000 mg | INHALATION_SOLUTION | Freq: Four times a day (QID) | RESPIRATORY_TRACT | Status: DC
  Administered 2016-01-11 – 2016-01-12 (×4): 2.5 mg via RESPIRATORY_TRACT
  Filled 2016-01-11 (×5): qty 3

## 2016-01-11 MED ORDER — METHYLPREDNISOLONE SODIUM SUCC 125 MG IJ SOLR
60.0000 mg | Freq: Three times a day (TID) | INTRAMUSCULAR | Status: DC
Start: 2016-01-11 — End: 2016-01-13
  Administered 2016-01-11 – 2016-01-13 (×6): 60 mg via INTRAVENOUS
  Filled 2016-01-11 (×6): qty 2

## 2016-01-11 MED ORDER — ENOXAPARIN SODIUM 30 MG/0.3ML ~~LOC~~ SOLN
30.0000 mg | SUBCUTANEOUS | Status: DC
  Administered 2016-01-11 – 2016-01-13 (×3): 30 mg via SUBCUTANEOUS
  Filled 2016-01-11 (×3): qty 0.3

## 2016-01-11 MED ORDER — SODIUM CHLORIDE 0.9 % IV BOLUS (SEPSIS)
500.0000 mL | Freq: Once | INTRAVENOUS | Status: AC
Start: 2016-01-11 — End: 2016-01-11
  Administered 2016-01-11: 500 mL via INTRAVENOUS

## 2016-01-11 MED ORDER — DM-GUAIFENESIN ER 30-600 MG PO TB12
1.0000 | ORAL_TABLET | Freq: Every day | ORAL | Status: DC
  Administered 2016-01-11 – 2016-01-13 (×3): 1 via ORAL
  Filled 2016-01-11 (×3): qty 1

## 2016-01-11 MED ORDER — MIRTAZAPINE 7.5 MG PO TABS
7.5000 mg | ORAL_TABLET | Freq: Every day | ORAL | Status: DC
  Administered 2016-01-11 – 2016-01-13 (×3): 7.5 mg via ORAL
  Filled 2016-01-11 (×5): qty 1

## 2016-01-11 MED ORDER — DEXTROSE 5 % IV SOLN
1.0000 g | Freq: Once | INTRAVENOUS | Status: AC
  Administered 2016-01-11: 1 g via INTRAVENOUS
  Filled 2016-01-11: qty 10

## 2016-01-11 MED ORDER — POLYETHYLENE GLYCOL 3350 17 G PO PACK
17.0000 g | PACK | Freq: Two times a day (BID) | ORAL | Status: DC
  Administered 2016-01-11 – 2016-01-12 (×2): 17 g via ORAL
  Filled 2016-01-11 (×3): qty 1

## 2016-01-11 MED ORDER — SODIUM CHLORIDE 0.9 % IV SOLN
INTRAVENOUS | Status: DC
Start: 2016-01-11 — End: 2016-01-12
  Administered 2016-01-11: 21:00:00 via INTRAVENOUS

## 2016-01-11 MED ORDER — LEVALBUTEROL HCL 0.63 MG/3ML IN NEBU: 0.6300 mg | INHALATION_SOLUTION | RESPIRATORY_TRACT | Status: DC | PRN

## 2016-01-11 MED ORDER — VITAMIN C 500 MG PO TABS
1000.0000 mg | ORAL_TABLET | Freq: Every day | ORAL | Status: DC
  Administered 2016-01-11 – 2016-01-14 (×4): 1000 mg via ORAL
  Filled 2016-01-11 (×5): qty 2

## 2016-01-11 MED ORDER — HYDROCODONE-ACETAMINOPHEN 5-325 MG PO TABS: 1.0000 | ORAL_TABLET | ORAL | Status: DC | PRN

## 2016-01-11 MED ORDER — TIOTROPIUM BROMIDE MONOHYDRATE 18 MCG IN CAPS
18.0000 ug | ORAL_CAPSULE | Freq: Every day | RESPIRATORY_TRACT | Status: DC
  Administered 2016-01-11 – 2016-01-14 (×3): 18 ug via RESPIRATORY_TRACT
  Filled 2016-01-11 (×2): qty 5

## 2016-01-11 MED ORDER — ASPIRIN 81 MG PO CHEW: 81.0000 mg | CHEWABLE_TABLET | Freq: Every day | ORAL | Status: DC

## 2016-01-11 MED ORDER — NEBIVOLOL HCL 2.5 MG PO TABS
2.5000 mg | ORAL_TABLET | Freq: Every day | ORAL | Status: DC
Start: 2016-01-11 — End: 2016-01-14
  Administered 2016-01-11 – 2016-01-14 (×3): 2.5 mg via ORAL
  Filled 2016-01-11 (×4): qty 1

## 2016-01-11 MED ORDER — ASPIRIN 81 MG PO CHEW
81.0000 mg | CHEWABLE_TABLET | Freq: Every day | ORAL | Status: DC
  Administered 2016-01-12 – 2016-01-14 (×3): 81 mg via ORAL
  Filled 2016-01-11 (×3): qty 1

## 2016-01-11 MED ORDER — SODIUM CHLORIDE 0.9 % IV BOLUS (SEPSIS)
500.0000 mL | Freq: Once | INTRAVENOUS | Status: AC
  Administered 2016-01-11: 500 mL via INTRAVENOUS

## 2016-01-11 NOTE — ED Notes (Signed)
PT given coffee and graham crackers.

## 2016-01-11 NOTE — Progress Notes (Addendum)
Chief Complaint:  Chief Complaint  Patient presents with  . COPD  . Shortness of Breath    since last night   . Cough    HPI: Anita Murphy is a 75 y.o. female who reports to [facility] today complaining of 1 month hx of cough but worsening SOB since last night, on 2 L continuous home oxygen but yesterday had to increase to 3 and O2 sat is still down 78%.  She is normally at home in the low 90s or 89 at most per her daughter. She has had productive green sputum. No swelling of her legs, no fevers or chills. Just cough. Perhaps some wheezing. She took mucinex. Since she was here 1 month ago she is feeling better.   Shw has been compliant with COPD meds. She was white lipped per daughter last night and "was having thoses spells" again. She has had no weight gain, no leg swelling, no objective fevers or chills, per daughter may have had it last night.    SpO2 Readings from Last 3 Encounters:  01/11/16 79%  10/16/15 96%  08/31/15 96%    Past Medical History  Diagnosis Date  . Other diseases of lung, not elsewhere classified   . Allergic rhinitis, cause unspecified   . Personal history of tobacco use, presenting hazards to health   . Elevated blood pressure reading without diagnosis of hypertension   . Chronic airway obstruction, not elsewhere classified   . Other (abnormal) findings on radiological examination of breast    Past Surgical History  Procedure Laterality Date  . Breast surgery Left     4-5 YEARS AGO  . Tubal ligation  1969   Social History   Social History  . Marital Status: Married    Spouse Name: N/A  . Number of Children: 4  . Years of Education: 12   Occupational History  . 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 Smoking status: Former Smoker -- 1.00 packs/day for 30 years    Types: Cigarettes    Quit date: 12/19/2002  . Smokeless tobacco: None  . Alcohol Use: No  . Drug Use: No  . Sexual Activity: No   Other  Topics Concern  . None   Social History Narrative   Always uses seat belts. Smoke alarm and carbon monoxide detector in the home. Guns in the home stored in locked cabinet.       Marital status:  Widowed; married x 52 yrs, happy, no abuse.  Husband with lung cancer and colon cancer.      Lives with one daughter, brother, and grandson.       Exercise: none       Caffeine use: Coffee 2 servings per day.      Employment: retired      ADLs: independent with all ADLs.  No assistant devices for ambulation.  Washes clothes, cooks.        Advanced Directives:  None; DNR/DNI in 2016.   Family History  Problem Relation Age of Onset  . Lung disease Sister   . Hypothyroidism Mother   . Aortic stenosis Mother   . Dementia Mother   . COPD Sister     smoker  . Heart disease Father   . Mesothelioma Father     asbestos exp  . Cancer Father     LUNG   No Known Allergies Prior to Admission medications  Medication Sig Start Date End Date Taking? Authorizing Provider  Aclidinium Bromide (TUDORZA PRESSAIR) 400 MCG/ACT AEPB Inhale 1 puff into the lungs 2 (two) times daily. One twice daily 08/31/15  Yes Nyoka Cowden, MD  aspirin 81 MG chewable tablet Chew 81 mg by mouth daily.   Yes Historical Provider, MD  budesonide (PULMICORT) 0.25 MG/2ML nebulizer solution Take 2 mLs (0.25 mg total) by nebulization 2 (two) times daily. DX: J96.11; J44.9 09/23/15  Yes Nyoka Cowden, MD  feeding supplement, ENSURE ENLIVE, (ENSURE ENLIVE) LIQD Take 237 mLs by mouth 2 (two) times daily between meals. 07/24/15  Yes Alyssa A Haney, MD  formoterol (PERFOROMIST) 20 MCG/2ML nebulizer solution Take 2 mLs (20 mcg total) by nebulization 2 (two) times daily. Use in nebulizer twice daily perfectly regularly  DX: J96.11; J44.9 09/23/15  Yes Nyoka Cowden, MD  hydrocortisone 2.5 % ointment Apply topically 2 (two) times daily. 11/02/15  Yes [name], MD  levalbuterol [facility] HFA) 45 MCG/ACT inhaler Inhale 1-2 puffs into the  lungs every 4 (four) hours as needed for wheezing. 07/22/15  Yes [name], MD  levalbuterol Pauline Aus) 1.25 MG/3ML nebulizer solution Take 0.63 mg by nebulization every 4 (four) hours as needed for wheezing. DX: J96.11; J44.9 09/23/15  Yes Nyoka Cowden, MD  mirtazapine (REMERON) 7.5 MG tablet Take 1 tablet (7.5 mg total) by mouth at bedtime. 11/02/15  Yes [name], MD  polyethylene glycol powder (GLYCOLAX/MIRALAX) powder MIX 17GM UTD AND TK  PO DAILY 07/22/15  Yes Historical Provider, MD     ROS: The patient denies fevers, chills, night sweats, unintentional weight loss, chest pain, palpitations, nausea, vomiting, abdominal pain, dysuria, hematuria, melena, numbness, weakness, or tingling.   All other systems have been reviewed and were otherwise negative with the exception of those mentioned in the HPI and as above.    PHYSICAL EXAM: Filed Vitals:   01/11/16 1005  BP: 130/68  Pulse: 76  Temp: 98.1 F (36.7 C)  Resp: 22   This SmartLink has not been configured with any valid records.   SpO2 Readings from Last 3 Encounters:  01/11/16 79%  10/16/15 96%  08/31/15 96%    Body mass index is 15.54 kg/(m^2).   General: Alert, no acute distress, thin  HEENT:  Normocephalic, atraumatic, oropharynx patent. EOMI, PERRLA Cardiovascular:  Regular rate and rhythm, no rubs murmurs or gallops.  No Carotid bruits, radial pulse intact. No pedal edema.  Respiratory: Clear to auscultation bilaterally.  No wheezes, rales, or rhonchi.  No cyanosis, + use of accessory musculature Abdominal: No organomegaly, abdomen is soft and non-tender, positive bowel sounds. No masses. Skin: No rashes. Neurologic: Facial musculature symmetric. Psychiatric: Patient acts appropriately throughout our interaction. Lymphatic: No cervical or submandibular lymphadenopathy Musculoskeletal: Gait intact. No edema, tenderness   LABS: Results for orders placed or performed during the hospital encounter of  07/22/15  Basic metabolic panel  Result Value Ref Range   Sodium 142 135 - 145 mmol/L   Potassium 4.2 3.5 - 5.1 mmol/L   Chloride 107 101 - 111 mmol/L   CO2 26 22 - 32 mmol/L   Glucose, Bld 99 65 - 99 mg/dL   BUN 8 6 - 20 mg/dL   Creatinine, Ser 4.09 0.44 - 1.00 mg/dL   Calcium 8.5 (L) 8.9 - 10.3 mg/dL   GFR calc non Af Amer >60 >60 mL/min   GFR calc Af Amer >60 >60 mL/min   Anion gap 9 5 - 15  CBC with Differential/Platelet  Result Value Ref Range   WBC 7.1 4.0 - 10.5 K/uL   RBC 4.94 3.87 - 5.11 MIL/uL   Hemoglobin 13.4 12.0 - 15.0 g/dL   HCT 16.1 09.6 - 04.5 %   MCV 85.4 78.0 - 100.0 fL   MCH 27.1 26.0 - 34.0 pg   MCHC 31.8 30.0 - 36.0 g/dL   RDW 40.9 81.1 - 91.4 %   Platelets 231 150 - 400 K/uL   Neutrophils Relative % 65 43 - 77 %   Neutro Abs 4.7 1.7 - 7.7 K/uL   Lymphocytes Relative 26 12 - 46 %   Lymphs Abs 1.8 0.7 - 4.0 K/uL   Monocytes Relative 8 3 - 12 %   Monocytes Absolute 0.6 0.1 - 1.0 K/uL   Eosinophils Relative 0 0 - 5 %   Eosinophils Absolute 0.0 0.0 - 0.7 K/uL   Basophils Relative 1 0 - 1 %   Basophils Absolute 0.0 0.0 - 0.1 K/uL  Brain natriuretic peptide  Result Value Ref Range   B Natriuretic Peptide 65.9 0.0 - 100.0 pg/mL  Comprehensive metabolic panel  Result Value Ref Range   Sodium 141 135 - 145 mmol/L   Potassium 5.1 3.5 - 5.1 mmol/L   Chloride 106 101 - 111 mmol/L   CO2 25 22 - 32 mmol/L   Glucose, Bld 144 (H) 65 - 99 mg/dL   BUN 12 6 - 20 mg/dL   Creatinine, Ser 7.82 0.44 - 1.00 mg/dL   Calcium 9.0 8.9 - 95.6 mg/dL   Total Protein 6.4 (L) 6.5 - 8.1 g/dL   Albumin 3.6 3.5 - 5.0 g/dL   AST 34 15 - 41 U/L   ALT 29 14 - 54 U/L   Alkaline Phosphatase 39 38 - 126 U/L   Total Bilirubin 0.5 0.3 - 1.2 mg/dL   GFR calc non Af Amer >60 >60 mL/min   GFR calc Af Amer >60 >60 mL/min   Anion gap 10 5 - 15  CBC  Result Value Ref Range   WBC 4.6 4.0 - 10.5 K/uL   RBC 4.95 3.87 - 5.11 MIL/uL   Hemoglobin 13.3 12.0 - 15.0 g/dL   HCT 21.3 08.6 -  57.8 %   MCV 85.7 78.0 - 100.0 fL   MCH 26.9 26.0 - 34.0 pg   MCHC 31.4 30.0 - 36.0 g/dL   RDW 46.9 62.9 - 52.8 %   Platelets 239 150 - 400 K/uL  I-Stat Troponin, ED (not at [facility])  Result Value Ref Range   Troponin i, poc 0.01 0.00 - 0.08 ng/mL   Comment 3          I-Stat arterial blood gas, ED  Result Value Ref Range   pH, Arterial 7.312 (L) 7.350 - 7.450   pCO2 arterial 51.5 (H) 35.0 - 45.0 mmHg   pO2, Arterial 85.0 80.0 - 100.0 mmHg   Bicarbonate 26.1 (H) 20.0 - 24.0 mEq/L   TCO2 28 0 - 100 mmol/L   O2 Saturation 95.0 %   Acid-base deficit 1.0 0.0 - 2.0 mmol/L   Patient temperature 98.6 F    Collection site RADIAL, ALLEN'S TEST ACCEPTABLE    Drawn by RT    Sample type ARTERIAL      EKG/XRAY:   Primary read interpreted by Dr. Conley Rolls at [facility]. + emphysema, COPD changes, ? Right infiltrate vs chronic changes   ASSESSMENT/PLAN: Encounter Diagnoses  Name Primary?  . Hypoxia Yes  . Acute on chronic  respiratory failure with hypoxia (HCC)   . COPD III   . Severe protein-calorie malnutrition (HCC)   . Other fatigue    75 y/o female with COPD II on cont 2L home 02, poor nutrition, presents with acute on chronic hypoxia ? Right LL infiltrate vs COPD  O2 sat was 72-79 % on 2 L in office, went up slightly and now is on 4L at 89%  She has been compliant with meds  Send to ER by EMS for further evaluation, [facility] ER notified Daughters are present and agree with plan Fu prn   Gross sideeffects, risk and benefits, and alternatives of medications d/w patient. Patient is aware that all medications have potential sideeffects and we are unable to predict every sideeffect or drug-drug interaction that may occur.  Rodell Marrs DO  01/11/2016 10:44 AM

## 2016-01-11 NOTE — H&P (Signed)
Patient Demographics:    Anita Murphy, is a 75 y.o. female  MRN: [id number]   DOB - 1941-11-22  Admit Date - 01/11/2016  Outpatient Primary MD for the patient is Nilda Simmer, MD   With History of -  Past Medical History  Diagnosis Date  . Other diseases of lung, not elsewhere classified   . Allergic rhinitis, cause unspecified   . Personal history of tobacco use, presenting hazards to health   . Elevated blood pressure reading without diagnosis of hypertension   . Chronic airway obstruction, not elsewhere classified   . Other (abnormal) findings on radiological examination of breast       Past Surgical History  Procedure Laterality Date  . Breast surgery Left     4-5 YEARS AGO  . Tubal ligation  1969    in for   Chief Complaint  Patient presents with  . Respiratory Distress      HPI:    Anita Murphy  is a 75 y.o. 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 Gwenith Spitz history of breast cancer status post breast surgery 5 years ago, no other major medical problems comes to the hospital with 2-3 days of gradually progressive productive cough, shortness of breath and wheezing. Denies any fever chills or recent travel, no exposure to sick contacts, came to the ER where her workup was consistent with acute on chronic hypoxic respiratory failure, leukocytosis, diagnosis was made of bronchitis was his early pneumonia exacerbating COPD. I was called to admit the patient.  Patient currently denies any fever or chills, no headache, no chest pain palpitations, no abdominal pain, no dysuria, no blood in stool or urine, no focal weakness.    Review of systems:    In addition to the HPI above,  No Fever-chills, No Headache, No changes with Vision or hearing, No  problems swallowing food or Liquids, No Chest pain, +ve Cough & Shortness of Breath, No Abdominal pain, No Nausea or Vommitting, Bowel movements are regular, No Blood in stool or Urine, No dysuria, No new skin rashes or bruises, No new joints pains-aches,  No new weakness, tingling, numbness in any extremity, No recent weight gain or loss, No polyuria, polydypsia or polyphagia, No significant Mental Stressors.  A full 10 point Review of Systems was done, except as stated above, all other Review of Systems were negative.    Social History:     Social History  Substance Use Topics  . Smoking status: Former Smoker -- 1.00 packs/day for 30 years    Types: Cigarettes    Quit date: 12/19/2002  . Smokeless tobacco: Not on file  . Alcohol Use: No    Lives - At home, walks by herself without any problems      Family History :     Family History  Problem Relation Age of Onset  . Lung disease Sister   .  Hypothyroidism Mother   . Aortic stenosis Mother   . Dementia Mother   . COPD Sister     smoker  . Heart disease Father   . Mesothelioma Father     asbestos exp  . Cancer Father     LUNG       Home Medications:   Prior to Admission medications   Medication Sig Start Date End Date Taking? Authorizing Provider  Aclidinium Bromide (TUDORZA PRESSAIR) 400 MCG/ACT AEPB Inhale 1 puff into the lungs 2 (two) times daily. One twice daily 08/31/15  Yes Nyoka Cowden, MD  Ascorbic Acid (VITAMIN C) 1000 MG tablet Take 1,000 mg by mouth daily.   Yes Historical Provider, MD  aspirin 81 MG chewable tablet Chew 81 mg by mouth daily.   Yes Historical Provider, MD  budesonide (PULMICORT) 0.25 MG/2ML nebulizer solution Take 2 mLs (0.25 mg total) by nebulization 2 (two) times daily. DX: J96.11; J44.9 09/23/15  Yes Nyoka Cowden, MD  dextromethorphan-guaiFENesin [facility] DM) 30-600 MG 12hr tablet Take 1 tablet by mouth at bedtime.   Yes Historical Provider, MD  feeding supplement, ENSURE  ENLIVE, (ENSURE ENLIVE) LIQD Take 237 mLs by mouth 2 (two) times daily between meals. 07/24/15  Yes Alyssa A Haney, MD  formoterol (PERFOROMIST) 20 MCG/2ML nebulizer solution Take 2 mLs (20 mcg total) by nebulization 2 (two) times daily. Use in nebulizer twice daily perfectly regularly  DX: J96.11; J44.9 09/23/15  Yes Nyoka Cowden, MD  ipratropium (ATROVENT) 0.02 % nebulizer solution Take 2.5 mLs by nebulization every 4 (four) hours as needed for wheezing or shortness of breath.  12/18/15  Yes Historical Provider, MD  levalbuterol (XOPENEX HFA) 45 MCG/ACT inhaler Inhale 1-2 puffs into the lungs every 4 (four) hours as needed for wheezing. 07/22/15  Yes [name], MD  levalbuterol Pauline Aus) 1.25 MG/3ML nebulizer solution Take 0.63 mg by nebulization every 4 (four) hours as needed for wheezing. DX: J96.11; J44.9 09/23/15  Yes Nyoka Cowden, MD  mirtazapine (REMERON) 7.5 MG tablet Take 1 tablet (7.5 mg total) by mouth at bedtime. 11/02/15  Yes [name], MD  polyethylene glycol powder (GLYCOLAX/MIRALAX) powder MIX 17GM UTD AND TK  PO DAILY 07/22/15  Yes Historical Provider, MD  hydrocortisone 2.5 % ointment Apply topically 2 (two) times daily. Patient not taking: Reported on 01/11/2016 11/02/15   [name], MD     Allergies:    No Known Allergies   Physical Exam:   Vitals  Blood pressure 109/91, pulse 80, temperature 98.5 F (36.9 C), temperature source Oral, resp. rate 23, SpO2 93 %.   1. General  frail cachectic elderly African-American femalelying in bMild shortness of breath on 2 L nasal cannula oxygen,  2. Normal affect and insight, Not Suicidal or Homicidal, Awake Alert, Oriented X 3.  3. No F.N deficits, ALL C.Nerves Intact, Strength 5/5 all 4 extremities, Sensation intact all 4 extremities, Plantars down going.  4. Ears and Eyes appear Normal, Conjunctivae clear, PERRLA. Moist Oral Mucosa.  5. Supple Neck, No JVD, No cervical lymphadenopathy appriciated, No Carotid  Bruits.  6. Symmetrical Chest wall moMinimal bilateral air movement with minimal wheezes,  7. RRR, S1-S2,  No Gallops, Rubs or Murmurs, No Parasternal Heave.  8. Positive Bowel Sounds, Abdomen Soft, No tenderness, No organomegaly appriciated,No rebound -guarding or rigidity.  9.  No Cyanosis, Normal Skin Turgor,  multiple black macular lesions all over her body.  10. Good muscle tone,  joints appear normal , no  effusions, Normal ROM.  11. No Palpable Lymph Nodes in Neck or Axillae      Data Review:    CBC  Recent Labs Lab 01/11/16 1214  WBC 20.9*  HGB 12.5  HCT 40.4  PLT 236  MCV 87.3  MCH 27.0  MCHC 30.9  RDW 12.9  LYMPHSABS 1.2  MONOABS 1.6*  EOSABS 0.0  BASOSABS 0.0   ------------------------------------------------------------------------------------------------------------------  Chemistries   Recent Labs Lab 01/11/16 1214  NA 136  K 3.6  CL 93*  CO2 30  GLUCOSE 146*  BUN <5*  CREATININE 0.48  CALCIUM 9.3  AST 33  ALT 15  ALKPHOS 47  BILITOT 0.7   ------------------------------------------------------------------------------------------------------------------ estimated creatinine clearance is 38.7 mL/min (by C-G formula based on Cr of 0.48). ------------------------------------------------------------------------------------------------------------------ No results for input(s): TSH, T4TOTAL, T3FREE, THYROIDAB in the last 72 hours.  Invalid input(s): FREET3   Coagulation profile No results for input(s): INR, PROTIME in the last 168 hours. ------------------------------------------------------------------------------------------------------------------- No results for input(s): DDIMER in the last 72 hours. -------------------------------------------------------------------------------------------------------------------  Cardiac Enzymes No results for input(s): CKMB, TROPONINI, MYOGLOBIN in the last 168 hours.  Invalid input(s):  CK ------------------------------------------------------------------------------------------------------------------    Component Value Date/Time   BNP 318.3* 01/11/2016 1214     ---------------------------------------------------------------------------------------------------------------  Urinalysis    Component Value Date/Time   BILIRUBINUR neg 12/05/2012 1522   PROTEINUR neg 12/05/2012 1522   UROBILINOGEN 0.2 12/05/2012 1522   NITRITE neg 12/05/2012 1522   LEUKOCYTESUR Negative 12/05/2012 1522    ----------------------------------------------------------------------------------------------------------------   Imaging Results:    Dg Chest 1 View  01/11/2016  CLINICAL DATA:  Shortness of breath. Cough with hypoxia. History of COPD. EXAM: CHEST 1 VIEW COMPARISON:  07/22/2015 and 10/21/2014 radiographs. FINDINGS: Single AP view is mildly motion degraded. The heart size and mediastinal contours are stable. The lungs are hyperinflated. There are new patchy airspace opacities at the right lung base, suspicious for inflammation. Mildly increased left basilar markings appear chronic. There is no edema or significant pleural effusion. The bones appear unchanged. IMPRESSION: Chronic obstructive pulmonary disease with new patchy right basilar airspace disease suspicious for pneumonia, possibly on the basis of aspiration. Followup PA and lateral chest X-ray is recommended in 3-4 weeks following trial of antibiotic therapy to ensure resolution and exclude underlying malignancy. Electronically Signed   By: Carey Bullocks M.D.   On: 01/11/2016 10:41   Dg Chest Portable 1 View  01/11/2016  CLINICAL DATA:  Shortness of breath and cough since last night. EXAM: PORTABLE CHEST 1 VIEW COMPARISON:  01/11/2016 and priors. FINDINGS: Cardiomediastinal silhouette is normal. Mediastinal contours appear intact. There is no evidence of pneumothorax. The evaluation for pleural effusions is suboptimal due to  collimation. There are bilateral lower lobe streaky peribronchovascular opacities, more prominent on the right. Chronic hyperinflation and emphysematous changes are seen. Osseous structures are without acute abnormality. Soft tissues are grossly normal. IMPRESSION: Streaky peribronchovascular opacities in bilateral lower lobes, worse on the right, on the background of changes consistent with COPD. This may represent an acute infectious consolidation such as multifocal lobar pneumonia. Given the distribution and symmetry of appearance, aspiration pneumonia is of consideration. Electronically Signed   By: Ted Mcalpine M.D.   On: 01/11/2016 13:39    My personal review of EKG: Rhythm NSR, Rate 109/min,  no Acute ST changes   Assessment & Plan:      1. Acute on chronic hypoxic respiratory failure due to bronchitis/early pneumonia exacerbating her COPD.  patient seen, in a minimal shortness of breath on  2 L nasal cannula, much better once oxygen titrated up to 4 L nasal cannula. Will be admitted to stepdown, pneumonia protocol, blood and sputum culture, rule out influenza, place on IV Solu-Medrol and empiric Rocephin/azithromycin. Although her initial chest x-ray is negative this could be early pneumonia versus bronchitis. Does have significant leukocytosis.   We will place her on nebulizer treatments scheduled and as needed. Monitor closely. Do not think she is septic. Lactate borderline. We'll gently hydrate and trend.   2. Minimal elevation of troponin. Likely demand ischemia from #1 above. No chest pain, EKG nonacute, will trend troponin, continue aspirin, add Bystolic, check echogram to evaluate EF and wall motion.   3. Multiple dark macular lesions all over. Outpatient dermatology follow-up.    DVT Prophylaxis  Lovenox    AM Labs Ordered, also please review Full Orders  Family Communication: Admission, patients condition and plan of care including tests being ordered have been  discussed with the patient and daughter who indicate understanding and agree with the plan and Code Status.  Code Status Full  Likely DC to  Home 3-4 days  Condition GUARDED     Time spent in minutes : 35    SINGH,PRASHANT K M.D on 01/11/2016 at 4:15 PM  Between 7am to 7pm - Pager - [phone]  After 7pm go to [email or web address] - password [facility]  [facility] - Office  [phone]

## 2016-01-11 NOTE — ED Notes (Signed)
Pt here via [facility] from [facility].  Pt states cough x 3 weeks that increased to productive cough x 3 days.  Given 125 solumedrol and 5 albuterol/0.5 atrovent en-route.  Initial sats at [facility] were 79% (questionable pleth) (Pt normally sats 90% on 2L).  Breathing labored.  Diminished in all fields.

## 2016-01-11 NOTE — ED Notes (Addendum)
Dr Thedore Mins paged and responded re elevated lactic acid.

## 2016-01-11 NOTE — ED Provider Notes (Signed)
CSN: [id number]     Arrival date & time 01/11/16  1135 History   First MD Initiated Contact with Patient 01/11/16 1136     Chief Complaint  Patient presents with  . Respiratory Distress     (Consider location/radiation/quality/duration/timing/severity/associated sxs/prior Treatment) Patient is a 75 y.o. female presenting with shortness of breath. The history is provided by the patient.  Shortness of Breath Severity:  Severe Onset quality:  Gradual Duration:  3 weeks Timing:  Constant Progression:  Worsening Chronicity:  New Context: not URI   Relieved by:  Nothing Worsened by:  Nothing tried Ineffective treatments:  None tried Associated symptoms: cough, sputum production and wheezing   Associated symptoms: no abdominal pain, no chest pain and no fever   Cough:    Cough characteristics: dry cough for 2-3 weeks with productive cough  over 3 days.   Past Medical History  Diagnosis Date  . Other diseases of lung, not elsewhere classified   . Allergic rhinitis, cause unspecified   . Personal history of tobacco use, presenting hazards to health   . Elevated blood pressure reading without diagnosis of hypertension   . Chronic airway obstruction, not elsewhere classified   . Other (abnormal) findings on radiological examination of breast    Past Surgical History  Procedure Laterality Date  . Breast surgery Left     4-5 YEARS AGO  . Tubal ligation  1969   Family History  Problem Relation Age of Onset  . Lung disease Sister   . Hypothyroidism Mother   . Aortic stenosis Mother   . Dementia Mother   . COPD Sister     smoker  . Heart disease Father   . Mesothelioma Father     asbestos exp  . Cancer Father     LUNG   Social History  Substance Use Topics  . Smoking status: Former Smoker -- 1.00 packs/day for 30 years    Types: Cigarettes    Quit date: 12/19/2002  . Smokeless tobacco: None  . Alcohol Use: No   OB History    No data available     Review of Systems   Constitutional: Negative for fever.  HENT: Negative.   Eyes: Negative for visual disturbance.  Respiratory: Positive for cough, sputum production, shortness of breath and wheezing.   Cardiovascular: Negative for chest pain.  Gastrointestinal: Negative for abdominal pain.  Genitourinary: Negative.   Musculoskeletal: Negative.   Skin: Negative.   Neurological: Negative.       Allergies  Review of patient's allergies indicates no known allergies.  Home Medications   Prior to Admission medications   Medication Sig Start Date End Date Taking? Authorizing Provider  Aclidinium Bromide (TUDORZA PRESSAIR) 400 MCG/ACT AEPB Inhale 1 puff into the lungs 2 (two) times daily. One twice daily 08/31/15  Yes Nyoka Cowden, MD  Ascorbic Acid (VITAMIN C) 1000 MG tablet Take 1,000 mg by mouth daily.   Yes Historical Provider, MD  aspirin 81 MG chewable tablet Chew 81 mg by mouth daily.   Yes Historical Provider, MD  budesonide (PULMICORT) 0.25 MG/2ML nebulizer solution Take 2 mLs (0.25 mg total) by nebulization 2 (two) times daily. DX: J96.11; J44.9 09/23/15  Yes Nyoka Cowden, MD  dextromethorphan-guaiFENesin [facility] DM) 30-600 MG 12hr tablet Take 1 tablet by mouth at bedtime.   Yes Historical Provider, MD  feeding supplement, ENSURE ENLIVE, (ENSURE ENLIVE) LIQD Take 237 mLs by mouth 2 (two) times daily between meals. 07/24/15  Yes Bonney Aid, MD  formoterol (PERFOROMIST) 20 MCG/2ML nebulizer solution Take 2 mLs (20 mcg total) by nebulization 2 (two) times daily. Use in nebulizer twice daily perfectly regularly  DX: J96.11; J44.9 09/23/15  Yes Nyoka Cowden, MD  ipratropium (ATROVENT) 0.02 % nebulizer solution Take 2.5 mLs by nebulization every 4 (four) hours as needed for wheezing or shortness of breath.  12/18/15  Yes Historical Provider, MD  levalbuterol (XOPENEX HFA) 45 MCG/ACT inhaler Inhale 1-2 puffs into the lungs every 4 (four) hours as needed for wheezing. 07/22/15  Yes [name], MD   levalbuterol Pauline Aus) 1.25 MG/3ML nebulizer solution Take 0.63 mg by nebulization every 4 (four) hours as needed for wheezing. DX: J96.11; J44.9 09/23/15  Yes Nyoka Cowden, MD  mirtazapine (REMERON) 7.5 MG tablet Take 1 tablet (7.5 mg total) by mouth at bedtime. 11/02/15  Yes [name], MD  polyethylene glycol powder (GLYCOLAX/MIRALAX) powder MIX 17GM UTD AND TK  PO DAILY 07/22/15  Yes Historical Provider, MD  hydrocortisone 2.5 % ointment Apply topically 2 (two) times daily. Patient not taking: Reported on 01/11/2016 11/02/15   [name], MD   BP 103/82 mmHg  Pulse 150  Temp(Src) 98 F (36.7 C) (Oral)  Resp 30  Ht 5\' 2"  (1.575 m)  Wt 40.3 kg  BMI 16.25 kg/m2  SpO2 99% Physical Exam  Constitutional: She appears ill.  cpap in place  HENT:  Head: Normocephalic and atraumatic.  Mouth/Throat: No oropharyngeal exudate.  Eyes: Pupils are equal, round, and reactive to light.  Neck: No JVD present.  Cardiovascular: Normal rate, normal heart sounds and intact distal pulses.   Pulmonary/Chest: Accessory muscle usage present. Tachypnea noted. She has decreased breath sounds. She has wheezes. She has rhonchi.  Abdominal: Soft. She exhibits no distension. There is no tenderness.  Musculoskeletal: She exhibits no edema or tenderness.  Neurological: She is alert. No cranial nerve deficit. Coordination normal.  Skin: Skin is warm and dry. No rash noted. No erythema. No pallor.  Psychiatric: She has a normal mood and affect.  Nursing note and vitals reviewed.   ED Course  Procedures (including critical care time) Labs Review Labs Reviewed  CBC WITH DIFFERENTIAL/PLATELET - Abnormal; Notable for the following:    WBC 20.9 (*)    Neutro Abs 18.1 (*)    Monocytes Absolute 1.6 (*)    All other components within normal limits  COMPREHENSIVE METABOLIC PANEL - Abnormal; Notable for the following:    Chloride 93 (*)    Glucose, Bld 146 (*)    BUN <5 (*)    All other components within  normal limits  BRAIN NATRIURETIC PEPTIDE - Abnormal; Notable for the following:    B Natriuretic Peptide 318.3 (*)    All other components within normal limits  TROPONIN I - Abnormal; Notable for the following:    Troponin I 0.31 (*)    All other components within normal limits  CBC - Abnormal; Notable for the following:    WBC 19.7 (*)    Hemoglobin 11.7 (*)    All other components within normal limits  I-STAT TROPOININ, ED - Abnormal; Notable for the following:    Troponin i, poc 0.26 (*)    All other components within normal limits  I-STAT CG4 LACTIC ACID, ED - Abnormal; Notable for the following:    Lactic Acid, Venous 2.90 (*)    All other components within normal limits  I-STAT ARTERIAL BLOOD GAS, ED - Abnormal; Notable for the following:  pH, Arterial 7.337 (*)    pCO2 arterial 60.5 (*)    Bicarbonate 32.4 (*)    Acid-Base Excess 5.0 (*)    All other components within normal limits  I-STAT CG4 LACTIC ACID, ED - Abnormal; Notable for the following:    Lactic Acid, Venous 4.06 (*)    All other components within normal limits  CULTURE, EXPECTORATED SPUTUM-ASSESSMENT  CULTURE, BLOOD (ROUTINE X 2)  CULTURE, BLOOD (ROUTINE X 2)  GRAM STAIN  CULTURE, RESPIRATORY (NON-EXPECTORATED)  MRSA PCR SCREENING  CREATININE, SERUM  BLOOD GAS, ARTERIAL  TROPONIN I  TROPONIN I  HIV ANTIBODY (ROUTINE TESTING)  STREP PNEUMONIAE URINARY ANTIGEN  INFLUENZA PANEL BY PCR (TYPE A & B, H1N1)  LACTIC ACID, PLASMA  I-STAT CG4 LACTIC ACID, ED  I-STAT CG4 LACTIC ACID, ED    Imaging Review Dg Chest 1 View  01/11/2016  CLINICAL DATA:  Shortness of breath. Cough with hypoxia. History of COPD. EXAM: CHEST 1 VIEW COMPARISON:  07/22/2015 and 10/21/2014 radiographs. FINDINGS: Single AP view is mildly motion degraded. The heart size and mediastinal contours are stable. The lungs are hyperinflated. There are new patchy airspace opacities at the right lung base, suspicious for inflammation. Mildly  increased left basilar markings appear chronic. There is no edema or significant pleural effusion. The bones appear unchanged. IMPRESSION: Chronic obstructive pulmonary disease with new patchy right basilar airspace disease suspicious for pneumonia, possibly on the basis of aspiration. Followup PA and lateral chest X-ray is recommended in 3-4 weeks following trial of antibiotic therapy to ensure resolution and exclude underlying malignancy. Electronically Signed   By: Carey Bullocks M.D.   On: 01/11/2016 10:41   Dg Chest Portable 1 View  01/11/2016  CLINICAL DATA:  Shortness of breath and cough since last night. EXAM: PORTABLE CHEST 1 VIEW COMPARISON:  01/11/2016 and priors. FINDINGS: Cardiomediastinal silhouette is normal. Mediastinal contours appear intact. There is no evidence of pneumothorax. The evaluation for pleural effusions is suboptimal due to collimation. There are bilateral lower lobe streaky peribronchovascular opacities, more prominent on the right. Chronic hyperinflation and emphysematous changes are seen. Osseous structures are without acute abnormality. Soft tissues are grossly normal. IMPRESSION: Streaky peribronchovascular opacities in bilateral lower lobes, worse on the right, on the background of changes consistent with COPD. This may represent an acute infectious consolidation such as multifocal lobar pneumonia. Given the distribution and symmetry of appearance, aspiration pneumonia is of consideration. Electronically Signed   By: Ted Mcalpine M.D.   On: 01/11/2016 13:39   I have personally reviewed and evaluated these images and lab results as part of my medical decision-making.   EKG Interpretation   Date/Time:  Monday January 11 2016 11:42:58 EST Ventricular Rate:  103 PR Interval:  118 QRS Duration: 89 QT Interval:  352 QTC Calculation: 461 R Axis:   44 Text Interpretation:  Poor quality data, interpretation may be affected  Sinus tachycardia RSR' in V1 or V2,  probably normal variant Repol abnrm  suggests ischemia, anterolateral Artifact in lead(s) I III aVR aVL aVF V1  V2 V3 V4 V5 V6 Sinus rhythm Artifact Abnormal ekg Confirmed by Gerhard Munch  MD ([id number]) on 01/11/2016 11:53:24 AM      MDM   Final diagnoses:  None    Patient is a 75 year old female with a history of COPD and hypertension who presents from urgent care for concern of decreased O2 sats into the 70s in the setting of 3 weeks of worsening cough that turned productive over the  past 3 days. The patient received steroids and breathing treatments in route with EMS and arrives on CPAP with stable vital signs. Further history and exam as above for increased work of breathing with accessory muscle use and bilateral rhonchi and wheezing. Patient found to have a leukocytosis with elevated lactate and BNP. cxr with bilateral multifocal lobar pneumonia. Patient much improved on CPAP to the point where she is maintaining her own respirations on nasal cannula. Patient has no current risk factors for multi-drug-resistant organism thus will be covered with ceftriaxone and azithro for CAP. Patient given breathing treatments here. Patient will be admitted to medicine for further management and evaluation.    Marijean Niemann, MD 01/11/16 Norberta Keens  Gerhard Munch, MD 01/16/16 [phone]

## 2016-01-12 ENCOUNTER — Inpatient Hospital Stay (HOSPITAL_COMMUNITY): Payer: Medicare PPO

## 2016-01-12 DIAGNOSIS — I509 Heart failure, unspecified: Secondary | ICD-10-CM

## 2016-01-12 LAB — BASIC METABOLIC PANEL WITH GFR
Anion gap: 9 (ref 5–15)
BUN: 8 mg/dL (ref 6–20)
CO2: 32 mmol/L (ref 22–32)
Calcium: 9 mg/dL (ref 8.9–10.3)
Chloride: 100 mmol/L — ABNORMAL LOW (ref 101–111)
Creatinine, Ser: 0.68 mg/dL (ref 0.44–1.00)
GFR calc Af Amer: >60 mL/min
GFR calc non Af Amer: >60 mL/min
Glucose, Bld: 222 mg/dL — ABNORMAL HIGH (ref 65–99)
Potassium: 4.6 mmol/L (ref 3.5–5.1)
Sodium: 141 mmol/L (ref 135–145)

## 2016-01-12 LAB — CBC
HCT: 37.6 % (ref 36.0–46.0)
Hemoglobin: 11.6 g/dL — ABNORMAL LOW (ref 12.0–15.0)
MCH: 27.4 pg (ref 26.0–34.0)
MCHC: 30.9 g/dL (ref 30.0–36.0)
MCV: 88.7 fL (ref 78.0–100.0)
PLATELETS: 234 10*3/uL (ref 150–400)
RBC: 4.24 MIL/uL (ref 3.87–5.11)
RDW: 13.3 % (ref 11.5–15.5)
WBC: 20 10*3/uL — ABNORMAL HIGH (ref 4.0–10.5)

## 2016-01-12 LAB — GLUCOSE, CAPILLARY: Glucose-Capillary: 145 mg/dL — ABNORMAL HIGH (ref 65–99)

## 2016-01-12 LAB — MAGNESIUM: Magnesium: 2 mg/dL (ref 1.7–2.4)

## 2016-01-12 LAB — TROPONIN I: TROPONIN I: 0.33 ng/mL — AB (ref <0.031)

## 2016-01-12 LAB — LACTIC ACID, PLASMA: Lactic Acid, Venous: 0.9 mmol/L (ref 0.5–2.0)

## 2016-01-12 LAB — STREP PNEUMONIAE URINARY ANTIGEN: Strep Pneumo Urinary Antigen: NEGATIVE

## 2016-01-12 LAB — HIV ANTIBODY (ROUTINE TESTING W REFLEX): HIV Screen 4th Generation wRfx: NONREACTIVE

## 2016-01-12 MED ORDER — CETYLPYRIDINIUM CHLORIDE 0.05 % MT LIQD
7.0000 mL | Freq: Two times a day (BID) | OROMUCOSAL | Status: DC
  Administered 2016-01-12 – 2016-01-14 (×4): 7 mL via OROMUCOSAL

## 2016-01-12 MED ORDER — DEXTROSE 5 % IV SOLN
500.0000 mg | INTRAVENOUS | Status: DC
  Administered 2016-01-12 – 2016-01-13 (×2): 500 mg via INTRAVENOUS
  Filled 2016-01-12 (×4): qty 500

## 2016-01-12 MED ORDER — DEXTROSE 5 % IV SOLN
1.0000 g | INTRAVENOUS | Status: DC
  Administered 2016-01-12 – 2016-01-13 (×2): 1 g via INTRAVENOUS
  Filled 2016-01-12 (×3): qty 10

## 2016-01-12 MED ORDER — SODIUM CHLORIDE 0.9 % IV BOLUS (SEPSIS)
500.0000 mL | Freq: Once | INTRAVENOUS | Status: AC
  Administered 2016-01-12: 500 mL via INTRAVENOUS

## 2016-01-12 NOTE — Progress Notes (Signed)
Echocardiogram 2D Echocardiogram has been performed.  Nolon Rod 01/12/2016, 2:25 PM

## 2016-01-12 NOTE — Progress Notes (Signed)
Patient Demographics:    Anita Murphy, is a 75 y.o. female, DOB - August 06, 1941, ZOX:[id number]  Admit date - 01/11/2016   Admitting Physician Leroy Sea, MD  Outpatient Primary MD for the patient is SMITH,KRISTI, MD  LOS - 1   Chief Complaint  Patient presents with  . Respiratory Distress        Subjective:    Anita Murphy today has, No headache, No chest pain, No abdominal pain - No Nausea, No new weakness tingling or numbness, Improved Cough - SOB.     Assessment  & Plan :     1. Acute on chronic hypoxic respiratory failure due to bronchitis/early pneumonia exacerbating her COPD.Much improved this morning and feels almost close to baseline, continue empiric antibiotics and IV steroids along with scheduled and as needed nebulizer treatments. She is currently on 4 L nasal cannula oxygen and will try to titrate it down she uses about 2 L nasal cannula per minute at home. If stable will be discharged in the morning with outpatient follow-up with her pulmonary physician Dr. [name]. No sepsis..   2. Minimal elevation of troponin. Likely demand ischemia from #1 above. Upon in rise in non-ACS pattern. No chest pain, EKG nonacute, will trend troponin, continue aspirin, add Bystolic, check echogram to evaluate EF and wall motion.   3. Multiple dark macular lesions all over. Outpatient dermatology follow-up.     Code Status : Full  Family Communication  : Daughter  Disposition Plan  : Home 1-2days  Consults  :  None  Procedures  :   TTE  DVT Prophylaxis  :  Lovenox    Lab Results  Component Value Date   PLT 234 01/12/2016    Inpatient Medications  Scheduled Meds: . albuterol  2.5 mg Nebulization Q6H  . antiseptic oral rinse  7 mL Mouth Rinse BID  . arformoterol  15 mcg  Nebulization Q12H  . aspirin  81 mg Oral Daily  . azithromycin  500 mg Intravenous Q24H  . cefTRIAXone (ROCEPHIN)  IV  1 g Intravenous Q24H  . dextromethorphan-guaiFENesin  1 tablet Oral QHS  . enoxaparin (LOVENOX) injection  30 mg Subcutaneous Q24H  . feeding supplement (ENSURE ENLIVE)  237 mL Oral BID BM  . methylPREDNISolone (SOLU-MEDROL) injection  60 mg Intravenous TID  . mirtazapine  7.5 mg Oral QHS  . nebivolol  2.5 mg Oral Daily  . polyethylene glycol  17 g Oral BID  . tiotropium  18 mcg Inhalation Daily  . vitamin C  1,000 mg Oral Daily   Continuous Infusions:  PRN Meds:.HYDROcodone-acetaminophen, levalbuterol, ondansetron (ZOFRAN) IV  Antibiotics  :    Anti-infectives    Start     Dose/Rate Route Frequency Ordered Stop   01/12/16 1500  azithromycin (ZITHROMAX) 500 mg in dextrose 5 % 250 mL IVPB     500 mg 250 mL/hr over 60 Minutes Intravenous Every 24 hours 01/12/16 1126     01/12/16 1400  cefTRIAXone (ROCEPHIN) 1 g in dextrose 5 % 50 mL IVPB     1 g 100 mL/hr over 30 Minutes Intravenous Every 24 hours 01/12/16 1126     01/11/16 1415  cefTRIAXone (ROCEPHIN) 1 g in dextrose 5 % 50 mL IVPB  1 g 100 mL/hr over 30 Minutes Intravenous  Once 01/11/16 1405 01/11/16 1459   01/11/16 1415  azithromycin (ZITHROMAX) 500 mg in dextrose 5 % 250 mL IVPB     500 mg 250 mL/hr over 60 Minutes Intravenous  Once 01/11/16 1405 01/11/16 1624        Objective:   Filed Vitals:   01/12/16 0739 01/12/16 0800 01/12/16 0900 01/12/16 1151  BP: 71/62 88/71  127/68  Pulse: 62 65  89  Temp: 97.8 F (36.6 C)   98.5 F (36.9 C)  TempSrc: Oral   Oral  Resp: Height:      Weight:      SpO2: 100% 100% 95% 97%    Wt Readings from Last 3 Encounters:  01/11/16 40.3 kg (88 lb 13.5 oz)  01/11/16 38.556 kg (85 lb)  11/02/15 41.459 kg (91 lb 6.4 oz)     Intake/Output Summary (Last 24 hours) at 01/12/16 1249 Last data filed at 01/12/16 0800  Gross per 24 hour  Intake    745 ml   Output    300 ml  Net    445 ml     Physical Exam  Awake Alert, Oriented X 3, No new F.N deficits, Normal affect [place].AT,PERRAL Supple Neck,No JVD, No cervical lymphadenopathy appriciated.  Symmetrical Chest wall movement, poor air movement bilaterally,minimal wheezing RRR,No Gallops,Rubs or new Murmurs, No Parasternal Heave +ve B.Sounds, Abd Soft, No tenderness, No organomegaly appriciated, No rebound - guarding or rigidity. No Cyanosis, Clubbing or edema, No new Rash or bruise, chronic black multiple macular lesions all over her body       Data Review:   Micro Results Recent Results (from the past 240 hour(s))  Culture, sputum-assessment     Status: None   Collection Time: 01/11/16  5:17 PM  Result Value Ref Range Status   Specimen Description SPUTUM  Final   Special Requests NONE  Final   Sputum evaluation   Final    THIS SPECIMEN IS ACCEPTABLE. RESPIRATORY CULTURE REPORT TO FOLLOW.   Report Status 01/11/2016 FINAL  Final  Culture, respiratory (NON-Expectorated)     Status: None (Preliminary result)   Collection Time: 01/11/16  5:17 PM  Result Value Ref Range Status   Specimen Description SPUTUM  Final   Special Requests NONE  Final   Gram Stain   Final    ABUNDANT WBC PRESENT, PREDOMINANTLY PMN FEW SQUAMOUS EPITHELIAL CELLS PRESENT MODERATE GRAM NEGATIVE COCCOBACILLI MODERATE GRAM POSITIVE COCCI IN PAIRS IN CHAINS    Culture NO GROWTH Performed at [facility]   Final   Report Status PENDING  Incomplete  MRSA PCR Screening     Status: None   Collection Time: 01/11/16  7:05 PM  Result Value Ref Range Status   MRSA by PCR NEGATIVE NEGATIVE Final    Comment:        The GeneXpert MRSA Assay (FDA approved for NASAL specimens only), is one component of a comprehensive MRSA colonization surveillance program. It is not intended to diagnose MRSA infection nor to guide or monitor treatment for MRSA infections.     Radiology Reports Dg Chest 1  View  01/11/2016  CLINICAL DATA:  Shortness of breath. Cough with hypoxia. History of COPD. EXAM: CHEST 1 VIEW COMPARISON:  07/22/2015 and 10/21/2014 radiographs. FINDINGS: Single AP view is mildly motion degraded. The heart size and mediastinal contours are stable. The lungs are hyperinflated. There are new patchy airspace opacities at the right lung  base, suspicious for inflammation. Mildly increased left basilar markings appear chronic. There is no edema or significant pleural effusion. The bones appear unchanged. IMPRESSION: Chronic obstructive pulmonary disease with new patchy right basilar airspace disease suspicious for pneumonia, possibly on the basis of aspiration. Followup PA and lateral chest X-ray is recommended in 3-4 weeks following trial of antibiotic therapy to ensure resolution and exclude underlying malignancy. Electronically Signed   By: Carey Bullocks M.D.   On: 01/11/2016 10:41   Dg Chest Portable 1 View  01/11/2016  CLINICAL DATA:  Shortness of breath and cough since last night. EXAM: PORTABLE CHEST 1 VIEW COMPARISON:  01/11/2016 and priors. FINDINGS: Cardiomediastinal silhouette is normal. Mediastinal contours appear intact. There is no evidence of pneumothorax. The evaluation for pleural effusions is suboptimal due to collimation. There are bilateral lower lobe streaky peribronchovascular opacities, more prominent on the right. Chronic hyperinflation and emphysematous changes are seen. Osseous structures are without acute abnormality. Soft tissues are grossly normal. IMPRESSION: Streaky peribronchovascular opacities in bilateral lower lobes, worse on the right, on the background of changes consistent with COPD. This may represent an acute infectious consolidation such as multifocal lobar pneumonia. Given the distribution and symmetry of appearance, aspiration pneumonia is of consideration. Electronically Signed   By: Ted Mcalpine M.D.   On: 01/11/2016 13:39     CBC  Recent  Labs Lab 01/11/16 1214 01/11/16 1653 01/12/16 0944  WBC 20.9* 19.7* 20.0*  HGB 12.5 11.7* 11.6*  HCT 40.4 38.1 37.6  PLT 236 224 234  MCV 87.3 86.4 88.7  MCH 27.0 26.5 27.4  MCHC 30.9 30.7 30.9  RDW 12.9 12.8 13.3  LYMPHSABS 1.2  --   --   MONOABS 1.6*  --   --   EOSABS 0.0  --   --   BASOSABS 0.0  --   --     Chemistries   Recent Labs Lab 01/11/16 1214 01/11/16 1653 01/12/16 0944  NA 136  --  141  K 3.6  --  4.6  CL 93*  --  100*  CO2 30  --  32  GLUCOSE 146*  --  222*  BUN <5*  --  8  CREATININE 0.48 0.59 0.68  CALCIUM 9.3  --  9.0  MG  --   --  2.0  AST 33  --   --   ALT 15  --   --   ALKPHOS 47  --   --   BILITOT 0.7  --   --    ------------------------------------------------------------------------------------------------------------------ No results for input(s): CHOL, HDL, LDLCALC, TRIG, CHOLHDL, LDLDIRECT in the last 72 hours.  No results found for: HGBA1C ------------------------------------------------------------------------------------------------------------------ No results for input(s): TSH, T4TOTAL, T3FREE, THYROIDAB in the last 72 hours.  Invalid input(s): FREET3 ------------------------------------------------------------------------------------------------------------------ No results for input(s): VITAMINB12, FOLATE, FERRITIN, TIBC, IRON, RETICCTPCT in the last 72 hours.  Coagulation profile No results for input(s): INR, PROTIME in the last 168 hours.  No results for input(s): DDIMER in the last 72 hours.  Cardiac Enzymes  Recent Labs Lab 01/11/16 1653 01/11/16 2250 01/12/16 0410  TROPONINI 0.31* 0.33* 0.33*   ------------------------------------------------------------------------------------------------------------------    Component Value Date/Time   BNP 318.3* 01/11/2016 1214    Time Spent in minutes  35   SINGH,PRASHANT K M.D on 01/12/2016 at 12:49 PM  Between 7am to 7pm - Pager - [phone]  After 7pm go to  [email or web address] - password [facility]  [facility] -  Office  [phone]

## 2016-01-12 NOTE — Care Management Note (Signed)
Case Management Note  Patient Details  Name: Anita Murphy MRN: [id number] Date of Birth: July 06, 1941  Subjective/Objective:   Adm w copd exacerb                 Action/Plan: lives w fam, pcp dr Nilda Simmer   Expected Discharge Date:                  Expected Discharge Plan:     In-House Referral:     Discharge planning Services     Post Acute Care Choice:    Choice offered to:     DME Arranged:    DME Agency:     HH Arranged:    HH Agency:     Status of Service:     Medicare Important Message Given:    Date Medicare IM Given:    Medicare IM give by:    Date Additional Medicare IM Given:    Additional Medicare Important Message give by:     If discussed at Long Length of Stay Meetings, dates discussed:    Additional Comments: ur review done  Hanley Hays, RN 01/12/2016, 8:04 AM

## 2016-01-12 NOTE — Progress Notes (Signed)
Initial Nutrition Assessment  DOCUMENTATION CODES:   Underweight, Severe malnutrition in context of chronic illness  INTERVENTION:   -Continue Ensure Enlive po BID, each supplement provides 350 kcal and 20 grams of protein  NUTRITION DIAGNOSIS:   Malnutrition related to chronic illness as evidenced by severe depletion of body fat, severe depletion of muscle mass.  GOAL:   Patient will meet greater than or equal to 90% of their needs   MONITOR:   PO intake, Supplement acceptance, Labs, Weight trends, Skin, I & O's  REASON FOR ASSESSMENT:   Malnutrition Screening Tool    ASSESSMENT:   Anita Murphy is a 75 y.o. 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 Gwenith Spitz history of breast cancer status post breast surgery 5 years ago, no other major medical problems comes to the hospital with 2-3 days of gradually progressive productive cough, shortness of breath and wheezing. Denies any fever chills or recent travel, no exposure to sick contacts, came to the ER where her workup was consistent with acute on chronic hypoxic respiratory failure, leukocytosis, diagnosis was made of bronchitis was his early pneumonia exacerbating COPD. I was called to admit the patient.  Pt admitted with acute on chronic respiratory failure due to bronchitis/early pneumonia exacerbating COPD.   Spoke with pt and family at bedside. All confirm that pt has a great appetite. Noted pt consumed about 50% of her breakfast tray. PTA she was consuming 3 meals per day with 1-3 Ensure supplements for additional calories and protein. Per pt, her supply of Ensure became low and started using a protein powder at [facility].   Pt and family report that pt has successfully gained weight over the past 6 months due to diet interventions. Per pt daughter, pt got up to 90-92#, but has lost a few pounds due to poor appetite and limited availability of supplements.   Pt has Ensure ordered. Per RN  and family, pt has already consumed a container today.   Nutrition-Focused physical exam completed. Findings are severe fat depletion, severe muscle depletion, and no edema.   Discussed importance of continued compliance of high calorie, high protein diet and supplements. Pt and family appreciative of visit.   Labs reviewed.   Diet Order:  Diet Heart Room service appropriate?: Yes; Fluid consistency:: Thin  Skin:  Reviewed, no issues  Last BM:  01/12/16  Height:   Ht Readings from Last 1 Encounters:  01/11/16  (1.575 m)    Weight:   Wt Readings from Last 1 Encounters:  01/11/16 88 lb 13.5 oz (40.3 kg)    Ideal Body Weight:  50 kg  BMI:  Body mass index is 16.25 kg/(m^2).  Estimated Nutritional Needs:   Kcal:  1200-1400  Protein:  55-70 grams  Fluid:  1.2-1.4 L  EDUCATION NEEDS:   Education needs addressed  Caterina Racine A. Mayford Knife, RD, LDN, CDE Pager: [phone] After hours Pager: [phone]

## 2016-01-13 ENCOUNTER — Inpatient Hospital Stay (HOSPITAL_COMMUNITY): Payer: Medicare PPO

## 2016-01-13 MED ORDER — METHYLPREDNISOLONE SODIUM SUCC 125 MG IJ SOLR
60.0000 mg | Freq: Two times a day (BID) | INTRAMUSCULAR | Status: DC
  Administered 2016-01-13 – 2016-01-14 (×2): 60 mg via INTRAVENOUS
  Filled 2016-01-13 (×2): qty 2

## 2016-01-13 MED ORDER — IOHEXOL 350 MG/ML SOLN
75.0000 mL | Freq: Once | INTRAVENOUS | Status: AC | PRN
  Administered 2016-01-13: 75 mL via INTRAVENOUS

## 2016-01-13 MED ORDER — FUROSEMIDE 10 MG/ML IJ SOLN
20.0000 mg | Freq: Once | INTRAMUSCULAR | Status: AC
  Administered 2016-01-13: 20 mg via INTRAVENOUS
  Filled 2016-01-13: qty 2

## 2016-01-13 MED ORDER — GUAIFENESIN 100 MG/5ML PO SOLN
10.0000 mL | Freq: Four times a day (QID) | ORAL | Status: DC | PRN
  Administered 2016-01-13 – 2016-01-14 (×2): 200 mg via ORAL
  Filled 2016-01-13 (×2): qty 5
  Filled 2016-01-13: qty 10

## 2016-01-13 MED ORDER — ALBUTEROL SULFATE (2.5 MG/3ML) 0.083% IN NEBU
2.5000 mg | INHALATION_SOLUTION | Freq: Three times a day (TID) | RESPIRATORY_TRACT | Status: DC
  Administered 2016-01-13 – 2016-01-14 (×5): 2.5 mg via RESPIRATORY_TRACT
  Filled 2016-01-13 (×5): qty 3

## 2016-01-13 NOTE — Progress Notes (Signed)
Patient trasfered from [facility] to [phone] via wheelchair; alert and oriented x 4; no complaints of pain; IV saline locked in RFA, LFA and RH; skin intact. Orient patient to room and unit; instructed how to use the call bell and  fall risk precautions. Will continue to monitor the patient.

## 2016-01-13 NOTE — Progress Notes (Signed)
Inpatient Diabetes Program Recommendations  AACE/ADA: New Consensus Statement on Inpatient Glycemic Control (2015)  Target Ranges:  Prepandial:   less than 140 mg/dL      Peak postprandial:   less than 180 mg/dL (1-2 hours)      Critically ill patients:  140 - 180 mg/dL   Review of Glycemic Control  Inpatient Diabetes Program Recommendations:    Monitor CBGs during steroid therapy. Thank you  Piedad Climes BSN, RN,CDE Inpatient Diabetes Coordinator [phone] (team pager)

## 2016-01-13 NOTE — Clinical Documentation Improvement (Signed)
Internal Medicine  Based on the clinical findings below, please document any associated diagnoses/conditions the patient has or may have.   RD eval 1/24: "severe protein calorie malnutrition related to chronic illness as evidenced by severe depletion of body fat, severe depletion of muscle mass"  Other  Clinically Undetermined  Please exercise your independent, professional judgment when responding. A specific answer is not anticipated or expected.  Thank You, Beverley Fiedler RN [facility] [place] [phone]

## 2016-01-13 NOTE — Progress Notes (Signed)
Report called to 5W RN. Pt and belongings to be transferred shortly. Family and pt aware of move.

## 2016-01-13 NOTE — Progress Notes (Signed)
Patient Demographics:    Anita Murphy, is a 75 y.o. female, DOB - 10-Nov-1941, ZOX:[id number]  Admit date - 01/11/2016   Admitting Physician Leroy Sea, MD  Outpatient Primary MD for the patient is SMITH,KRISTI, MD  LOS - 2   Chief Complaint  Patient presents with  . Respiratory Distress        Subjective:    Anita Murphy today has, No headache, No chest pain, No abdominal pain - No Nausea, No new weakness tingling or numbness, Improved Cough - SOB.     Assessment  & Plan :     1. Acute on chronic hypoxic respiratory failure due to bronchitis/early pneumonia exacerbating her COPD.Much improved this morning and feels almost close to baseline, continue empiric antibiotics and IV steroids along with scheduled and as needed nebulizer treatments. She is currently on 4 L nasal cannula oxygen and will try to titrate it down she uses about 2 L nasal cannula per minute at home. If stable will be discharged in the morning with outpatient follow-up with her pulmonary physician Dr. [name]. No sepsis..    2. Minimal elevation of troponin. Likely demand ischemia from #1 above. Upon in rise in non-ACS pattern. No chest pain, EKG nonacute, will trend troponin, continue aspirin, add Bystolic, TTE noted to evaluate EF and wall motion.   3. Multiple dark macular lesions all over. Outpatient dermatology follow-up.    4. Severe pulmonary hypertension with PA pressure of 84. Likely due to COPD. Will check CTA to rule out PE.    Code Status : Full  Family Communication  : Daughter  Disposition Plan  : Home 1-2 days  Consults  :  None  Procedures  :   TTE  - Left ventricle: The cavity size was normal. Wall thickness was normal. Systolic function was normal. The estimated ejection fraction was in  the range of 60% to 65%. Wall motion was normal; there were no regional wall motion abnormalities. Dopplerparameters are consistent with abnormal left ventricularrelaxation (grade 1 diastolic dysfunction). - Ventricular septum: Mildly D-shaped interventricular septum, suggesting RV pressure/volume overload. - Aortic valve: There was no stenosis. - Mitral valve: There was no significant regurgitation. - Right ventricle: The cavity size was mildly to moderately dilated. Systolic function was normal. - Right atrium: The atrium was mildly dilated. - Tricuspid valve: Peak RV-RA gradient (S): 67 mm Hg. - Pulmonary arteries: PA peak pressure: 82 mm Hg (S). - Systemic veins: IVC measured 2.7 cm with < 50% respirophasic variation, suggesting RA pressure 15 mmHg. - Pericardium, extracardiac: A trivial pericardial effusion was identified.  Impressions:  - Normal LV size and systolic function, EF 60-65%. Mild tomoderately dilated RV with normal systolic function. Mildly D-shaped interventricular septum with evidence for RV pressure/volume overload. Severe pulmonary hypertension. Dilated IVC suggestive of elevated RV filling pressure.  DVT Prophylaxis  :  Lovenox    Lab Results  Component Value Date   PLT 234 01/12/2016    Inpatient Medications  Scheduled Meds: . albuterol  2.5 mg Nebulization TID  . antiseptic oral rinse  7 mL Mouth Rinse BID  . arformoterol  15 mcg Nebulization Q12H  . aspirin  81 mg Oral Daily  . azithromycin  500 mg Intravenous Q24H  .  cefTRIAXone (ROCEPHIN)  IV  1 g Intravenous Q24H  . dextromethorphan-guaiFENesin  1 tablet Oral QHS  . enoxaparin (LOVENOX) injection  30 mg Subcutaneous Q24H  . feeding supplement (ENSURE ENLIVE)  237 mL Oral BID BM  . methylPREDNISolone (SOLU-MEDROL) injection  60 mg Intravenous TID  . mirtazapine  7.5 mg Oral QHS  . nebivolol  2.5 mg Oral Daily  . tiotropium  18 mcg Inhalation Daily  . vitamin C  1,000 mg Oral Daily    Continuous Infusions:  PRN Meds:.HYDROcodone-acetaminophen, levalbuterol, ondansetron (ZOFRAN) IV  Antibiotics  :    Anti-infectives    Start     Dose/Rate Route Frequency Ordered Stop   01/12/16 1500  azithromycin (ZITHROMAX) 500 mg in dextrose 5 % 250 mL IVPB     500 mg 250 mL/hr over 60 Minutes Intravenous Every 24 hours 01/12/16 1126     01/12/16 1400  cefTRIAXone (ROCEPHIN) 1 g in dextrose 5 % 50 mL IVPB     1 g 100 mL/hr over 30 Minutes Intravenous Every 24 hours 01/12/16 1126     01/11/16 1415  cefTRIAXone (ROCEPHIN) 1 g in dextrose 5 % 50 mL IVPB     1 g 100 mL/hr over 30 Minutes Intravenous  Once 01/11/16 1405 01/11/16 1459   01/11/16 1415  azithromycin (ZITHROMAX) 500 mg in dextrose 5 % 250 mL IVPB     500 mg 250 mL/hr over 60 Minutes Intravenous  Once 01/11/16 1405 01/11/16 1624        Objective:   Filed Vitals:   01/13/16 0500 01/13/16 0600 01/13/16 0700 01/13/16 0800  BP: 103/74 105/68 102/59 137/67  Pulse: 66 65 74 101  Temp:   98.4 F (36.9 C)   TempSrc:   Oral   Resp: 15 17 20 16   Height:      Weight:      SpO2: 100% 100% 100% 100%    Wt Readings from Last 3 Encounters:  01/11/16 40.3 kg (88 lb 13.5 oz)  01/11/16 38.556 kg (85 lb)  11/02/15 41.459 kg (91 lb 6.4 oz)     Intake/Output Summary (Last 24 hours) at 01/13/16 0915 Last data filed at 01/12/16 2100  Gross per 24 hour  Intake    874 ml  Output    400 ml  Net    474 ml     Physical Exam  Awake Alert, Oriented X 3, No new F.N deficits, Normal affect Gobles.AT,PERRAL Supple Neck,No JVD, No cervical lymphadenopathy appriciated.  Symmetrical Chest wall movement, poor air movement bilaterally,minimal wheezing RRR,No Gallops,Rubs or new Murmurs, No Parasternal Heave +ve B.Sounds, Abd Soft, No tenderness, No organomegaly appriciated, No rebound - guarding or rigidity. No Cyanosis, Clubbing or edema, No new Rash or bruise, chronic black multiple macular lesions all over her body       Data  Review:   Micro Results Recent Results (from the past 240 hour(s))  Culture, blood (routine x 2) Call MD if unable to obtain prior to antibiotics being given     Status: None (Preliminary result)   Collection Time: 01/11/16 12:05 PM  Result Value Ref Range Status   Specimen Description BLOOD RIGHT ANTECUBITAL  Final   Special Requests BOTTLES DRAWN AEROBIC AND ANAEROBIC 10CC  Final   Culture NO GROWTH < 24 HOURS  Final   Report Status PENDING  Incomplete  Culture, blood (routine x 2) Call MD if unable to obtain prior to antibiotics being given     Status: None (Preliminary result)  Collection Time: 01/11/16  3:57 PM  Result Value Ref Range Status   Specimen Description BLOOD RIGHT ANTECUBITAL  Final   Special Requests BOTTLES DRAWN AEROBIC AND ANAEROBIC 10CC  Final   Culture NO GROWTH < 24 HOURS  Final   Report Status PENDING  Incomplete  Culture, sputum-assessment     Status: None   Collection Time: 01/11/16  5:17 PM  Result Value Ref Range Status   Specimen Description SPUTUM  Final   Special Requests NONE  Final   Sputum evaluation   Final    THIS SPECIMEN IS ACCEPTABLE. RESPIRATORY CULTURE REPORT TO FOLLOW.   Report Status 01/11/2016 FINAL  Final  Culture, respiratory (NON-Expectorated)     Status: None (Preliminary result)   Collection Time: 01/11/16  5:17 PM  Result Value Ref Range Status   Specimen Description SPUTUM  Final   Special Requests NONE  Final   Gram Stain   Final    ABUNDANT WBC PRESENT, PREDOMINANTLY PMN FEW SQUAMOUS EPITHELIAL CELLS PRESENT MODERATE GRAM NEGATIVE COCCOBACILLI MODERATE GRAM POSITIVE COCCI IN PAIRS IN CHAINS    Culture   Final    Culture reincubated for better growth Performed at [facility]    Report Status PENDING  Incomplete  MRSA PCR Screening     Status: None   Collection Time: 01/11/16  7:05 PM  Result Value Ref Range Status   MRSA by PCR NEGATIVE NEGATIVE Final    Comment:        The GeneXpert MRSA Assay  (FDA approved for NASAL specimens only), is one component of a comprehensive MRSA colonization surveillance program. It is not intended to diagnose MRSA infection nor to guide or monitor treatment for MRSA infections.     Radiology Reports Dg Chest 1 View  01/11/2016  CLINICAL DATA:  Shortness of breath. Cough with hypoxia. History of COPD. EXAM: CHEST 1 VIEW COMPARISON:  07/22/2015 and 10/21/2014 radiographs. FINDINGS: Single AP view is mildly motion degraded. The heart size and mediastinal contours are stable. The lungs are hyperinflated. There are new patchy airspace opacities at the right lung base, suspicious for inflammation. Mildly increased left basilar markings appear chronic. There is no edema or significant pleural effusion. The bones appear unchanged. IMPRESSION: Chronic obstructive pulmonary disease with new patchy right basilar airspace disease suspicious for pneumonia, possibly on the basis of aspiration. Followup PA and lateral chest X-ray is recommended in 3-4 weeks following trial of antibiotic therapy to ensure resolution and exclude underlying malignancy. Electronically Signed   By: Carey Bullocks M.D.   On: 01/11/2016 10:41   Dg Chest Portable 1 View  01/11/2016  CLINICAL DATA:  Shortness of breath and cough since last night. EXAM: PORTABLE CHEST 1 VIEW COMPARISON:  01/11/2016 and priors. FINDINGS: Cardiomediastinal silhouette is normal. Mediastinal contours appear intact. There is no evidence of pneumothorax. The evaluation for pleural effusions is suboptimal due to collimation. There are bilateral lower lobe streaky peribronchovascular opacities, more prominent on the right. Chronic hyperinflation and emphysematous changes are seen. Osseous structures are without acute abnormality. Soft tissues are grossly normal. IMPRESSION: Streaky peribronchovascular opacities in bilateral lower lobes, worse on the right, on the background of changes consistent with COPD. This may  represent an acute infectious consolidation such as multifocal lobar pneumonia. Given the distribution and symmetry of appearance, aspiration pneumonia is of consideration. Electronically Signed   By: Ted Mcalpine M.D.   On: 01/11/2016 13:39     CBC  Recent Labs Lab 01/11/16 1214 01/11/16 1653  01/12/16 0944  WBC 20.9* 19.7* 20.0*  HGB 12.5 11.7* 11.6*  HCT 40.4 38.1 37.6  PLT 236 224 234  MCV 87.3 86.4 88.7  MCH 27.0 26.5 27.4  MCHC 30.9 30.7 30.9  RDW 12.9 12.8 13.3  LYMPHSABS 1.2  --   --   MONOABS 1.6*  --   --   EOSABS 0.0  --   --   BASOSABS 0.0  --   --     Chemistries   Recent Labs Lab 01/11/16 1214 01/11/16 1653 01/12/16 0944  NA 136  --  141  K 3.6  --  4.6  CL 93*  --  100*  CO2 30  --  32  GLUCOSE 146*  --  222*  BUN <5*  --  8  CREATININE 0.48 0.59 0.68  CALCIUM 9.3  --  9.0  MG  --   --  2.0  AST 33  --   --   ALT 15  --   --   ALKPHOS 47  --   --   BILITOT 0.7  --   --    ------------------------------------------------------------------------------------------------------------------ No results for input(s): CHOL, HDL, LDLCALC, TRIG, CHOLHDL, LDLDIRECT in the last 72 hours.  No results found for: HGBA1C ------------------------------------------------------------------------------------------------------------------ No results for input(s): TSH, T4TOTAL, T3FREE, THYROIDAB in the last 72 hours.  Invalid input(s): FREET3 ------------------------------------------------------------------------------------------------------------------ No results for input(s): VITAMINB12, FOLATE, FERRITIN, TIBC, IRON, RETICCTPCT in the last 72 hours.  Coagulation profile No results for input(s): INR, PROTIME in the last 168 hours.  No results for input(s): DDIMER in the last 72 hours.  Cardiac Enzymes  Recent Labs Lab 01/11/16 1653 01/11/16 2250 01/12/16 0410  TROPONINI 0.31* 0.33* 0.33*    ------------------------------------------------------------------------------------------------------------------    Component Value Date/Time   BNP 318.3* 01/11/2016 1214    Time Spent in minutes  35   SINGH,PRASHANT K M.D on 01/13/2016 at 9:15 AM  Between 7am to 7pm - Pager - [phone]  After 7pm go to [email or web address] - password [facility]  [facility] -  Office  [phone]

## 2016-01-14 LAB — CULTURE, RESPIRATORY W GRAM STAIN

## 2016-01-14 LAB — CULTURE, RESPIRATORY

## 2016-01-14 MED ORDER — PREDNISONE 5 MG PO TABS: ORAL_TABLET | ORAL | Status: DC

## 2016-01-14 MED ORDER — AZITHROMYCIN 500 MG PO TABS: 500.0000 mg | ORAL_TABLET | Freq: Every day | ORAL | Status: DC

## 2016-01-14 MED ORDER — CEFPODOXIME PROXETIL 200 MG PO TABS: 200.0000 mg | ORAL_TABLET | Freq: Two times a day (BID) | ORAL | Status: DC

## 2016-01-14 MED ORDER — NEBIVOLOL HCL 2.5 MG PO TABS: 2.5000 mg | ORAL_TABLET | Freq: Every day | ORAL | Status: DC

## 2016-01-14 NOTE — Discharge Summary (Signed)
Anita Murphy, is a 75 y.o. female  DOB 01-31-1941  MRN [id number].  Admission date:  01/11/2016  Admitting Physician  Thurnell Lose, MD  Discharge Date:  01/14/2016   Primary MD  [name], MD  Recommendations for primary care physician for things to follow:   Outpatient pulmonary follow-up. Check CBC, BMP and a 2 view chest x-ray in 7-10 days.  Outpatient dermatology follow-up for chronic macular lesions   Admission Diagnosis  sob   Discharge Diagnosis  sob      Principal Problem:   COPD exacerbation ([place]) Active Problems:   CAP (community acquired pneumonia)   Acute and chronic respiratory failure (acute-on-chronic) ([place])      Past Medical History  Diagnosis Date  . Other diseases of lung, not elsewhere classified   . Allergic rhinitis, cause unspecified   . Personal history of tobacco use, presenting hazards to health   . Elevated blood pressure reading without diagnosis of hypertension   . Chronic airway obstruction, not elsewhere classified   . Other (abnormal) findings on radiological examination of breast     Past Surgical History  Procedure Laterality Date  . Breast surgery Left     4-5 YEARS AGO  . Tubal ligation  1969       HPI  from the history and physical done on the day of admission:     Anita Murphy is a 75 y.o. 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 Sherrine Maples history of breast cancer status post breast surgery 5 years ago, no other major medical problems comes to the hospital with 2-3 days of gradually progressive productive cough, shortness of breath and wheezing. Denies any fever chills or recent travel, no exposure to sick contacts, came to the ER where her workup was consistent with acute on chronic hypoxic respiratory  failure, leukocytosis, diagnosis was made of bronchitis was his early pneumonia exacerbating COPD. I was called to admit the patient.  Patient currently denies any fever or chills, no headache, no chest pain palpitations, no abdominal pain, no dysuria, no blood in stool or urine, no focal weakness.    Hospital Course:     1. 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 She will be discharged home with outpatient follow-up with her primary lung physician Dr. Shyrl Numbers. No sepsis this admission.  2. Minimal elevation of troponin. Likely demand ischemia from #1 above. rise in non-ACS pattern. No chest pain, EKG nonacute, continue aspirin, added Bystolic, Stable TTE with preserved EF and wall motion.   3. Multiple dark macular lesions all over. Outpatient dermatology follow-up.    4. Severe pulmonary hypertension with  PA pressure of 84. Due to COPD, CT angiogram chest rules out PE.     Discharge Condition: Fair  Follow UP  Follow-up Information    Follow up with SMITH,KRISTI, MD. Schedule an appointment as soon as possible for a visit in 1 week.   Specialty:  Family Medicine   Contact information:   [place] [address] [phone]       Follow up with Christinia Gully, MD. Schedule an appointment as soon as possible for a visit in 1 week.   Specialty:  Pulmonary Disease   Contact information:   [address] [place] [address] [phone]        Consults obtained -   Diet and Activity recommendation: See Discharge Instructions below  Discharge Instructions       Discharge  Instructions    Diet - low sodium heart healthy    Complete by:  As directed      Discharge instructions    Complete by:  As directed   Follow with Primary MD SMITH,KRISTI, MD in 7 days   Get CBC, CMP, 2 view Chest X ray checked  by Primary MD next visit.    Activity: As tolerated with Full fall precautions use walker/cane & assistance as needed   Disposition Home     Diet:   Heart Healthy    For Heart failure patients - Check your Weight same time everyday, if you gain over 2 pounds, or you develop in leg swelling, experience more shortness of breath or chest pain, call your Primary MD immediately. Follow Cardiac Low Salt Diet and 1.5 lit/day fluid restriction.   On your next visit with your primary care physician please Get Medicines reviewed and adjusted.   Please request your Prim.MD to go over all Hospital Tests and Procedure/Radiological results at the follow up, please get all Hospital records sent to your Prim MD by signing hospital release before you go home.   If you experience worsening of your admission symptoms, develop shortness of breath, life threatening emergency, suicidal or homicidal thoughts you must seek medical attention immediately by calling 911 or calling your MD immediately  if symptoms less severe.  You Must read complete instructions/literature along with all the possible adverse reactions/side effects for all the Medicines you take and that have been prescribed to you. Take any new Medicines after you have completely understood and accpet all the possible adverse reactions/side effects.   Do not drive, operating heavy machinery, perform activities at heights, swimming or participation in water activities or provide baby sitting services if your were admitted for syncope or siezures until you have seen by Primary MD or a Neurologist and advised to do so again.  Do not drive when taking Pain medications.    Do not take more than prescribed Pain, Sleep and  Anxiety Medications  Special Instructions: If you have smoked or chewed Tobacco  in the last 2 yrs please stop smoking, stop any regular Alcohol  and or any Recreational drug use.  Wear Seat belts while driving.   Please note  You were cared for by a hospitalist during your hospital stay. If you have any questions about your discharge medications or the care you received while you were in the hospital after you are discharged, you can call the unit and asked to speak with the hospitalist on call if the hospitalist that took care of you is not available. Once you are discharged, your primary care physician will handle  any further medical issues. Please note that NO REFILLS for any discharge medications will be authorized once you are discharged, as it is imperative that you return to your primary care physician (or establish a relationship with a primary care physician if you do not have one) for your aftercare needs so that they can reassess your need for medications and monitor your lab values.     Increase activity slowly    Complete by:  As directed              Discharge Medications       Medication List    TAKE these medications        Aclidinium Bromide 400 MCG/ACT Aepb  Commonly known as:  TUDORZA PRESSAIR  Inhale 1 puff into the lungs 2 (two) times daily. One twice daily     aspirin 81 MG chewable tablet  Chew 81 mg by mouth daily.     azithromycin 500 MG tablet  Commonly known as:  ZITHROMAX  Take 1 tablet (500 mg total) by mouth daily.     budesonide 0.25 MG/2ML nebulizer solution  Commonly known as:  PULMICORT  Take 2 mLs (0.25 mg total) by nebulization 2 (two) times daily. DX: J96.11; J44.9     cefpodoxime 200 MG tablet  Commonly known as:  VANTIN  Take 1 tablet (200 mg total) by mouth 2 (two) times daily.     dextromethorphan-guaiFENesin 30-600 MG 12hr tablet  Commonly known as:  MUCINEX DM  Take 1 tablet by mouth at bedtime.     feeding supplement  (ENSURE ENLIVE) Liqd  Take 237 mLs by mouth 2 (two) times daily between meals.     formoterol 20 MCG/2ML nebulizer solution  Commonly known as:  PERFOROMIST  Take 2 mLs (20 mcg total) by nebulization 2 (two) times daily. Use in nebulizer twice daily perfectly regularly  DX: J96.11; J44.9     hydrocortisone 2.5 % ointment  Apply topically 2 (two) times daily.     ipratropium 0.02 % nebulizer solution  Commonly known as:  ATROVENT  Take 2.5 mLs by nebulization every 4 (four) hours as needed for wheezing or shortness of breath.     levalbuterol 1.25 MG/3ML nebulizer solution  Commonly known as:  XOPENEX  Take 0.63 mg by nebulization every 4 (four) hours as needed for wheezing. DX: J96.11; J44.9     levalbuterol 45 MCG/ACT inhaler  Commonly known as:  XOPENEX HFA  Inhale 1-2 puffs into the lungs every 4 (four) hours as needed for wheezing.     mirtazapine 7.5 MG tablet  Commonly known as:  REMERON  Take 1 tablet (7.5 mg total) by mouth at bedtime.     nebivolol 2.5 MG tablet  Commonly known as:  BYSTOLIC  Take 1 tablet (2.5 mg total) by mouth daily.     polyethylene glycol powder powder  Commonly known as:  GLYCOLAX/MIRALAX  MIX 17GM UTD AND TK  PO DAILY     predniSONE 5 MG tablet  Commonly known as:  DELTASONE  Label  & dispense according to the schedule below. 10 Pills PO for 3 days then, 8 Pills PO for 3 days, 6 Pills PO for 3 days, 4 Pills PO for 3 days, 2 Pills PO for 3 days, 1 Pills PO for 3 days, 1/2 Pill  PO for 3 days then STOP. Total 95 pills.     vitamin C 1000 MG tablet  Take 1,000 mg by mouth daily.  Major procedures and Radiology Reports - PLEASE review detailed and final reports for all details, in brief -    TTE  - Left ventricle: The cavity size was normal. Wall thickness was normal. Systolic function was normal. The estimated ejection fraction was in the range of 60% to 65%. Wall motion was normal; there were no regional wall motion abnormalities.  Dopplerparameters are consistent with abnormal left ventricularrelaxation (grade 1 diastolic dysfunction). - Ventricular septum: Mildly D-shaped interventricular septum, suggesting RV pressure/volume overload. - Aortic valve: There was no stenosis. - Mitral valve: There was no significant regurgitation. - Right ventricle: The cavity size was mildly to moderately dilated. Systolic function was normal. - Right atrium: The atrium was mildly dilated. - Tricuspid valve: Peak RV-RA gradient (S): 67 mm Hg. - Pulmonary arteries: PA peak pressure: 82 mm Hg (S). - Systemic veins: IVC measured 2.7 cm with < 50% respirophasic variation, suggesting RA pressure 15 mmHg. - Pericardium, extracardiac: A trivial pericardial effusion was identified.  Impressions:  - Normal LV size and systolic function, EF 72-09%. Mild tomoderately dilated RV with normal systolic function. Mildly D-shaped interventricular septum with evidence for RV pressure/volume overload. Severe pulmonary hypertension. Dilated IVC suggestive of elevated RV filling pressure.   Dg Chest 1 View  01/11/2016  CLINICAL DATA:  Shortness of breath. Cough with hypoxia. History of COPD. EXAM: CHEST 1 VIEW COMPARISON:  07/22/2015 and 10/21/2014 radiographs. FINDINGS: Single AP view is mildly motion degraded. The heart size and mediastinal contours are stable. The lungs are hyperinflated. There are new patchy airspace opacities at the right lung base, suspicious for inflammation. Mildly increased left basilar markings appear chronic. There is no edema or significant pleural effusion. The bones appear unchanged. IMPRESSION: Chronic obstructive pulmonary disease with new patchy right basilar airspace disease suspicious for pneumonia, possibly on the basis of aspiration. Followup PA and lateral chest X-ray is recommended in 3-4 weeks following trial of antibiotic therapy to ensure resolution and exclude underlying malignancy. Electronically Signed    By: Richardean Sale M.D.   On: 01/11/2016 10:41   Ct Angio Chest Pe W/cm &/or Wo Cm  01/13/2016  CLINICAL DATA:  Severe shortness of breath. Cough. Chest congestion. EXAM: CT ANGIOGRAPHY CHEST WITH CONTRAST TECHNIQUE: Multidetector CT imaging of the chest was performed using the standard protocol during bolus administration of intravenous contrast. Multiplanar CT image reconstructions and MIPs were obtained to evaluate the vascular anatomy. CONTRAST:  75m OMNIPAQUE IOHEXOL 350 MG/ML SOLN COMPARISON:  Portable chest dated 01/11/2016. FINDINGS: Mediastinum/Lymph Nodes: The images were obtained after the majority of the bolus of contrast had passed through the pulmonary arteries due to the fact that the patient coughed during the injection and the scan had to be restarted. However, the pulmonary arteries are felt to be adequately opacified to evaluate for pulmonary emboli. No pulmonary emboli or thoracic aortic dissection identified. No masses or pathologically enlarged lymph nodes identified. Lungs/Pleura: Marked diffuse bilateral bullous changes. Mildly prominent interstitial markings. No lung nodules or pleural fluid. No airspace consolidation other than a small amount of atelectasis at the posterior right lung base. Upper abdomen: No acute findings. Musculoskeletal: Thoracic spine degenerative changes. Review of the MIP images confirms the above findings. IMPRESSION: 1. No pulmonary emboli seen. 2. 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 This predominantly paraseptal with some centrilobular components. 3. Mild right basilar atelectasis. Electronically Signed   By: SClaudie ReveringM.D.   On: 01/13/2016 12:27   Dg Chest Portable 1 View  01/11/2016  CLINICAL DATA:  Shortness of breath and cough since last night. EXAM: PORTABLE CHEST 1 VIEW COMPARISON:  01/11/2016 and priors. FINDINGS: Cardiomediastinal silhouette is normal. Mediastinal contours appear intact. There is no evidence of  pneumothorax. The evaluation for pleural effusions is suboptimal due to collimation. There are bilateral lower lobe streaky peribronchovascular opacities, more prominent on the right. Chronic hyperinflation and emphysematous changes are seen. Osseous structures are without acute abnormality. Soft tissues are grossly normal. IMPRESSION: Streaky peribronchovascular opacities in bilateral lower lobes, worse on the right, on the background of changes consistent with COPD. This may represent an acute infectious consolidation such as multifocal lobar pneumonia. Given the distribution and symmetry of appearance, aspiration pneumonia is of consideration. Electronically Signed   By: Fidela Salisbury M.D.   On: 01/11/2016 13:39    Micro Results      Recent Results (from the past 240 hour(s))  Culture, blood (routine x 2) Call MD if unable to obtain prior to antibiotics being given     Status: None (Preliminary result)   Collection Time: 01/11/16 12:05 PM  Result Value Ref Range Status   Specimen Description BLOOD RIGHT ANTECUBITAL  Final   Special Requests BOTTLES DRAWN AEROBIC AND ANAEROBIC 10CC  Final   Culture NO GROWTH 2 DAYS  Final   Report Status PENDING  Incomplete  Culture, blood (routine x 2) Call MD if unable to obtain prior to antibiotics being given     Status: None (Preliminary result)   Collection Time: 01/11/16  3:57 PM  Result Value Ref Range Status   Specimen Description BLOOD RIGHT ANTECUBITAL  Final   Special Requests BOTTLES DRAWN AEROBIC AND ANAEROBIC 10CC  Final   Culture NO GROWTH 2 DAYS  Final   Report Status PENDING  Incomplete  Culture, sputum-assessment     Status: None   Collection Time: 01/11/16  5:17 PM  Result Value Ref Range Status   Specimen Description SPUTUM  Final   Special Requests NONE  Final   Sputum evaluation   Final    THIS SPECIMEN IS ACCEPTABLE. RESPIRATORY CULTURE REPORT TO FOLLOW.   Report Status 01/11/2016 FINAL  Final  Culture, respiratory  (NON-Expectorated)     Status: None (Preliminary result)   Collection Time: 01/11/16  5:17 PM  Result Value Ref Range Status   Specimen Description SPUTUM  Final   Special Requests NONE  Final   Gram Stain   Final    ABUNDANT WBC PRESENT, PREDOMINANTLY PMN FEW SQUAMOUS EPITHELIAL CELLS PRESENT MODERATE GRAM NEGATIVE COCCOBACILLI MODERATE GRAM POSITIVE COCCI IN PAIRS IN CHAINS    Culture   Final    Culture reincubated for better growth Performed at [facility]    Report Status PENDING  Incomplete  MRSA PCR Screening     Status: None   Collection Time: 01/11/16  7:05 PM  Result Value Ref Range Status   MRSA by PCR NEGATIVE NEGATIVE Final    Comment:        The GeneXpert MRSA Assay (FDA approved for NASAL specimens only), is one component of a comprehensive MRSA colonization surveillance program. It is not intended to diagnose MRSA infection nor to guide or monitor treatment for MRSA infections.        Today   Subjective    Keiarra Jordahl today has no headache,no chest abdominal pain,no new weakness tingling or numbness, feels much better wants to go home today.     Objective   Blood pressure 107/47, pulse 57, temperature 97.7 F (  36.5 C), temperature source Oral, resp. rate 18, height '5\' 2"'  (1.575 m), weight 41.3 kg (91 lb 0.8 oz), SpO2 96 %.   Intake/Output Summary (Last 24 hours) at 01/14/16 0929 Last data filed at 01/13/16 1846  Gross per 24 hour  Intake    720 ml  Output    750 ml  Net    -30 ml    Exam Awake Alert, Oriented x 3, No new F.N deficits, Normal affect [place].AT,PERRAL Supple Neck,No JVD, No cervical lymphadenopathy appriciated.  Symmetrical Chest wall movement, Mod air movement bilaterally, CTAB RRR,No Gallops,Rubs or new Murmurs, No Parasternal Heave +ve B.Sounds, Abd Soft, Non tender, No organomegaly appriciated, No rebound -guarding or rigidity. No Cyanosis, Clubbing or edema, No new Rash or bruise   Data Review   CBC w  Diff: Lab Results  Component Value Date   WBC 20.0* 01/12/2016   WBC 7.7 12/05/2012   HGB 11.6* 01/12/2016   HGB 13.5 12/05/2012   HCT 37.6 01/12/2016   HCT 45.8 12/05/2012   PLT 234 01/12/2016   LYMPHOPCT 6 01/11/2016   MONOPCT 8 01/11/2016   EOSPCT 0 01/11/2016   BASOPCT 0 01/11/2016    CMP: Lab Results  Component Value Date   NA 141 01/12/2016   K 4.6 01/12/2016   CL 100* 01/12/2016   CO2 32 01/12/2016   BUN 8 01/12/2016   CREATININE 0.68 01/12/2016   CREATININE 0.58* 07/22/2015   PROT 7.4 01/11/2016   ALBUMIN 3.7 01/11/2016   BILITOT 0.7 01/11/2016   ALKPHOS 47 01/11/2016   AST 33 01/11/2016   ALT 15 01/11/2016  .   Total Time in preparing paper work, data evaluation and todays exam - 35 minutes  Thurnell Lose M.D on 01/14/2016 at 9:29 AM  [facility]   Office  [phone]

## 2016-01-14 NOTE — Care Management Important Message (Signed)
Important Message  Patient Details  Name: Anita Murphy MRN: [id number] Date of Birth: 1941-11-05   Medicare Important Message Given:  Yes    Kyla Balzarine 01/14/2016, 1:28 PM

## 2016-01-14 NOTE — Evaluation (Addendum)
Physical Therapy Evaluation & discharge Patient Details Name: Anita Murphy MRN: [id number] DOB: August 24, 1941 Today's Date: 01/14/2016   History of Present Illness  75 y.o. 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 Gwenith Spitz history of breast cancer status post breast surgery 5 years ago, no other major medical problems comes to the hospital with 2-3 days of gradually progressive productive cough, shortness of breath and wheezing. Denies any fever chills or recent travel, no exposure to sick contacts, came to the ER where her workup was consistent with acute on chronic hypoxic respiratory failure, leukocytosis, diagnosis was made of bronchitis was his early pneumonia exacerbating COPD.    Clinical Impression  Pt de-sat on 2 L/min with transfers in room and ambulated on 3 L/min without AD with o2 88-91%.  Pt has 24 hour S and ambulating close to baseline, but just limited by dyspnea.  Pt educated on pursed lip breathing and reviewed seated LE therex that pt is familiar with to con't with strengthening.  No follow up PT needed, but pt/family aware that if pt has difficulty at home they can request further PT.  Pt is scheduled for d/c from hospital today and will d/c from PT caseload.  If pt ends up staying, please re-order PT.    Follow Up Recommendations No PT follow up    Equipment Recommendations  None recommended by PT    Recommendations for Other Services       Precautions / Restrictions Precautions Precaution Comments: monitor o2 sats Restrictions Weight Bearing Restrictions: No      Mobility  Bed Mobility Overal bed mobility: Independent                Transfers Overall transfer level: Modified independent               General transfer comment: Pt transferred from [facility] to bed on 2 L/min and 02 87% with heavy accessory muscle use with breathing  Ambulation/Gait Ambulation/Gait assistance: Supervision Ambulation Distance  (Feet): 70 Feet Assistive device: None Gait Pattern/deviations: Step-through pattern;Drifts right/left Gait velocity: decreased   General Gait Details: Pt ambulated with o2 at 3L/min with 3/4 dyspnea and o2 sat 88-91% at end of gait, quicklky settling in at 91%.    Stairs            Wheelchair Mobility    Modified Rankin (Stroke Patients Only)       Balance Overall balance assessment: No apparent balance deficits (not formally assessed)                                           Pertinent Vitals/Pain Pain Assessment: No/denies pain    Home Living Family/patient expects to be discharged to:: Private residence Living Arrangements: Children Available Help at Discharge: Family;Available 24 hours/day Type of Home: House Home Access: Level entry     Home Layout: One level Home Equipment: Other (comment) Additional Comments: oxygen    Prior Function Level of Independence: Independent               Hand Dominance   Dominant Hand: Right    Extremity/Trunk Assessment   Upper Extremity Assessment: Overall WFL for tasks assessed           Lower Extremity Assessment: Overall WFL for tasks assessed      Cervical / Trunk Assessment: Normal  Communication  Communication: No difficulties  Cognition Arousal/Alertness: Awake/alert Behavior During Therapy: WFL for tasks assessed/performed Overall Cognitive Status: Within Functional Limits for tasks assessed                      General Comments General comments (skin integrity, edema, etc.): Heavy use of accessory musculature for breathing    Exercises        Assessment/Plan    PT Assessment Patent does not need any further PT services  PT Diagnosis     PT Problem List    PT Treatment Interventions     PT Goals (Current goals can be found in the Care Plan section) Acute Rehab PT Goals Patient Stated Goal: go home PT Goal Formulation: All assessment and education  complete, DC therapy    Frequency     Barriers to discharge        Co-evaluation               End of Session Equipment Utilized During Treatment: Oxygen;Gait belt Activity Tolerance: Patient limited by fatigue Patient left: in chair;with family/visitor present Nurse Communication: Mobility status;Other (comment) (o2 sats)         Time: 1610-9604 PT Time Calculation (min) (ACUTE ONLY): 21 min   Charges:   PT Evaluation $PT Eval Low Complexity: 1 Procedure     PT G Codes:        Cailen Texeira LUBECK 01/14/2016, 11:13 AM

## 2016-01-14 NOTE — Discharge Instructions (Signed)
Follow with Primary MD SMITH,KRISTI, MD in 7 days   Get CBC, CMP, 2 view Chest X ray checked  by Primary MD next visit.    Activity: As tolerated with Full fall precautions use walker/cane & assistance as needed   Disposition Home     Diet:   Heart Healthy    For Heart failure patients - Check your Weight same time everyday, if you gain over 2 pounds, or you develop in leg swelling, experience more shortness of breath or chest pain, call your Primary MD immediately. Follow Cardiac Low Salt Diet and 1.5 lit/day fluid restriction.   On your next visit with your primary care physician please Get Medicines reviewed and adjusted.   Please request your Prim.MD to go over all Hospital Tests and Procedure/Radiological results at the follow up, please get all Hospital records sent to your Prim MD by signing hospital release before you go home.   If you experience worsening of your admission symptoms, develop shortness of breath, life threatening emergency, suicidal or homicidal thoughts you must seek medical attention immediately by calling 911 or calling your MD immediately  if symptoms less severe.  You Must read complete instructions/literature along with all the possible adverse reactions/side effects for all the Medicines you take and that have been prescribed to you. Take any new Medicines after you have completely understood and accpet all the possible adverse reactions/side effects.   Do not drive, operating heavy machinery, perform activities at heights, swimming or participation in water activities or provide baby sitting services if your were admitted for syncope or siezures until you have seen by Primary MD or a Neurologist and advised to do so again.  Do not drive when taking Pain medications.    Do not take more than prescribed Pain, Sleep and Anxiety Medications  Special Instructions: If you have smoked or chewed Tobacco  in the last 2 yrs please stop smoking, stop any regular  Alcohol  and or any Recreational drug use.  Wear Seat belts while driving.   Please note  You were cared for by a hospitalist during your hospital stay. If you have any questions about your discharge medications or the care you received while you were in the hospital after you are discharged, you can call the unit and asked to speak with the hospitalist on call if the hospitalist that took care of you is not available. Once you are discharged, your primary care physician will handle any further medical issues. Please note that NO REFILLS for any discharge medications will be authorized once you are discharged, as it is imperative that you return to your primary care physician (or establish a relationship with a primary care physician if you do not have one) for your aftercare needs so that they can reassess your need for medications and monitor your lab values.

## 2016-01-14 NOTE — Progress Notes (Signed)
Pt given discharge instructions, prescriptions, and care notes. Pt verbalized understanding AEB no further questions or concerns at this time. IV was discontinued, no redness, pain, or swelling noted at this time. Pt left the floor via wheelchair with staff in stable condition. 

## 2016-01-16 LAB — CULTURE, BLOOD (ROUTINE X 2)
CULTURE: NO GROWTH
CULTURE: NO GROWTH

## 2016-01-18 ENCOUNTER — Ambulatory Visit (INDEPENDENT_AMBULATORY_CARE_PROVIDER_SITE_OTHER): Payer: Medicare PPO | Admitting: Family Medicine

## 2016-01-18 ENCOUNTER — Ambulatory Visit (INDEPENDENT_AMBULATORY_CARE_PROVIDER_SITE_OTHER): Payer: Medicare PPO | Admitting: Internal Medicine

## 2016-01-18 ENCOUNTER — Encounter: Payer: Self-pay | Admitting: Internal Medicine

## 2016-01-18 ENCOUNTER — Encounter: Payer: Self-pay | Admitting: Family Medicine

## 2016-01-18 ENCOUNTER — Ambulatory Visit (INDEPENDENT_AMBULATORY_CARE_PROVIDER_SITE_OTHER): Payer: Medicare PPO

## 2016-01-18 VITALS — BP 139/80 | HR 67 | Temp 98.3°F | Resp 16 | Wt 96.4 lb

## 2016-01-18 VITALS — BP 140/68 | HR 70 | Ht 62.0 in | Wt 95.8 lb

## 2016-01-18 DIAGNOSIS — J9611 Chronic respiratory failure with hypoxia: Secondary | ICD-10-CM

## 2016-01-18 DIAGNOSIS — J441 Chronic obstructive pulmonary disease with (acute) exacerbation: Secondary | ICD-10-CM

## 2016-01-18 DIAGNOSIS — J9612 Chronic respiratory failure with hypercapnia: Secondary | ICD-10-CM

## 2016-01-18 DIAGNOSIS — J9621 Acute and chronic respiratory failure with hypoxia: Secondary | ICD-10-CM | POA: Diagnosis not present

## 2016-01-18 DIAGNOSIS — I272 Other secondary pulmonary hypertension: Secondary | ICD-10-CM

## 2016-01-18 DIAGNOSIS — I10 Essential (primary) hypertension: Secondary | ICD-10-CM | POA: Diagnosis not present

## 2016-01-18 DIAGNOSIS — J189 Pneumonia, unspecified organism: Secondary | ICD-10-CM

## 2016-01-18 DIAGNOSIS — R739 Hyperglycemia, unspecified: Secondary | ICD-10-CM

## 2016-01-18 DIAGNOSIS — J449 Chronic obstructive pulmonary disease, unspecified: Secondary | ICD-10-CM

## 2016-01-18 MED ORDER — NEBIVOLOL HCL 2.5 MG PO TABS: 2.5000 mg | ORAL_TABLET | Freq: Every day | ORAL | Status: DC

## 2016-01-18 NOTE — Patient Instructions (Signed)
Because you received an x-ray today, you will receive an invoice from [facility]. Please contact [facility] at [phone] with questions or concerns regarding your invoice. Our billing staff will not be able to assist you with those questions. °

## 2016-01-18 NOTE — Patient Instructions (Signed)
02 2lpm 24/7  For cough/ congestion > mucinex or mucinex dm up 1200 mg every 12 hours and use the  flutter valve as much as  you can   Wean off prednisone and call if breathing or coughing get worse to see me or my NP Tammy

## 2016-01-18 NOTE — Progress Notes (Signed)
Subjective:    Patient ID: Anita Murphy, female    DOB: 08-23-1941    MRN: [id number]    Brief patient profile:  75  yobf quit smoking 2004 with no resp problems until around 2009 referred to pulmonary clinic 05/31/2013 by DR Nilda Simmer for ? Copd > proved to have GOLD III severity 08/21/2013    History of Present Illness  05/31/2013 1st pulmonary ov cc acutely short of breath p exp to cleaner in 2009  and since then variable doe on symbicort and spiriva  - seems better with saba x  few hours then worse again.  rec Continue Symbicort 160 Take 2 puffs first thing in am and then another 2 puffs about 12 hours later.  And spiriva one daily  Only use your albuterol (xopenex)as a rescue medication    10/21/2014 f/u ov/Anita Murphy re: GOLD III copd  Chief Complaint  Patient presents with  . Follow-up    Pt states that her breathing is unchanged since the last visit. Using xopenex inhaler on average 2 x per day.   doe x getting excited  To mailbox and back s stopping but definitely her limit   No noct symptoms rec Work on hfa  technique     03/23/2015 f/u ov/Anita Murphy re: GOLD III copd spiriva/ symbicort Chief Complaint  Patient presents with  . Follow-up    Pt states her breathing is about the same. Some worse today "when I realized I was late".  She uses xopenex 1-2 x per day on average.   Uses xopenex around 1230 pm ,most every day not clear why No change doe x mb and back  Can't afford spiriva per insurance but doesn't know alternatives from formulary  rec Plan A = automatic = symbicort 160 x 2 puffs  then followed by tudorza x 1 puff and do this twice daily Plan B= Only use your xopenex  as a rescue medication  Work on inhaler technique    Expand All Collapse All     Date of Admission: 8/3/2016Date of Discharge: 07/24/2015    Indication for Hospitalization:  Acute respiratory distress in setting of COPD exacerbation  Discharge Diagnoses/Problem List:   Patient Active Problem List   Diagnosis Date Noted  . Acute on chronic respiratory failure 07/22/2015  . COPD exacerbation 07/22/2015  . Severe protein-calorie malnutrition 07/22/2015  . Elevated blood pressure 07/22/2015  . Routine general medical examination at a health care facility 12/07/2012  . Need for prophylactic vaccination and inoculation against influenza 12/07/2012  . Routine gynecological examination 12/07/2012  . Hypoxia 12/07/2012  . COPD III 12/07/2012  . Weight loss 12/07/2012  . Stress reaction 12/07/2012  . Hordeolum 12/07/2012     Disposition: Home  Discharge Condition: Stable  Discharge Exam:   Temp: [98.1 F (36.7 C)-98.8 F (37.1 C)] 98.1 F (36.7 C) (08/06 0657) Pulse Rate: [78-95] 80 (08/06 0657) Resp: [18-20] 20 (08/06 0657) BP: (96-158)/(50-90) 103/59 mmHg (08/06 0657) SpO2: [91 %-98 %] 96 % (08/06 0746) Weight: [83 lb (37.649 kg)] 83 lb (37.649 kg) (08/06 0657) Physical Exam: General: NAD Cardiovascular: Mild tachycardia, regular rhythm, no murmurs Respiratory: distant breath sounds but clear, air movement throughout, increased WOB off O2, that improves with [place] Abdomen: soft, non tender, non distended Extremities: WWP, no edema  Brief Hospital Course:  75 y/o F with PMH significant for COPD, presented to the ED with respiratory distress concerning for a COPD exacerbation On admission she had a chest xray which was negative for  acute process but chronic COPD changes. HEr admission WBC was 7.1 and her BNP was 65.  She was started on supplemental O2, solumedrol, and continued on her home COPD regimen. On HD2 she was transferred to the family medicine teaching service and was started on doxycline to complete a 5 day course. Her oxygen was weaned as tolerated until this requirement resolved.  On day of discharge her prednisone ( D4 of steroids) was discontinued and was she was transitioned to decadron  20 mg once. Additionally while inpatient her symbicort MDI was transitioned to nebulized pulmacort as there was concern that she was air trapping and ineffectually using the MDI. Her breathing improved wit this therapy, so a prescription for a nebulizer was given to her daughter to fill on 8/5 in anticipation of discharge.  She clinically improved by day of discharged with improved respiratory status and no oxygen requirement            08/31/2015 post hosp f/u ov/Anita Murphy re: GOLD III criteria/ 2lpm 02 24/7   Chief Complaint  Patient presents with  . Follow-up    Pt c/o increased SOB since the last visit here in April 2016.    24/7 02 2lpm x walk slowly room / no cough/ fm very confused with meds / names/ B vs D medicare rules rec Plan A = automatic = Stop atrovent and start Tudorza one twice daily  Plus in nebulizer:  budesonide and perforomist twice daily  Pan B = Backup  If Can't get your breath, use neb  levoalbuterol every 4 hours and with budesonide twice daily if not able to get performoist rx filled by [facility] Under Medicare B All of your solutions for your nebulizer are Part B and all the others are part D    10/16/2015  f/u ov/Anita Murphy re: COPD III criteria/ 02 dep 2lpm 24/7  Chief Complaint  Patient presents with  . Follow-up    Pt states her breathing has improved. She has had one "attack" in Sept 2016.   Neb ipatropium qid  / 2lpm 24/7 / perf/bud bid Room to room at home  rec No change in medications or 02 for now. Ipatropium is four times daily whereas as performist and budesonide twice daily    01/18/2016  f/u ov/Anita Murphy re: copd  Chief Complaint  Patient presents with  . Follow-up    Pt recently admittted with PNA. Her breathing has improved since then. She has some cough with white sputum.   sleep ok / no excess am cough/ never had fever with recent dx of "pna"  Nor def as dz  Back  To baseline doe on 2lpm 24/7 and sob across the room    No obvious daytime variabilty  or assoc chronic cough or cp or chest tightness, subjective wheeze overt sinus or hb symptoms. No unusual other exposure hx or h/o childhood pna/ asthma or knowledge of premature birth.   Sleeping ok without nocturnal  or early am exacerbation  of respiratory  c/o's or need for noct saba. Also denies any obvious fluctuation of symptoms with weather or environmental changes or other aggravating or alleviating factors except as outlined above  .   Current Medications, Allergies, Past Medical History, Past Surgical History, Family History, and Social History were reviewed in [employer] record.  ROS  The following are not active complaints unless bolded sore throat, dysphagia, dental problems, itching, sneezing,  nasal congestion or excess/ purulent secretions, ear ache,   fever, chills,  sweats, unintended wt loss reversed, pleuritic or exertional cp, hemoptysis,  orthopnea pnd or leg swelling, presyncope, palpitations, heartburn, abdominal pain, anorexia, nausea, vomiting, diarrhea  or change in bowel or urinary habits, change in stools or urine, dysuria,hematuria,  rash, arthralgias, visual complaints, headache, numbness weakness or ataxia or problems with walking or coordination,  change in mood/affect or memory.          Objective:   Physical Exam  W/c bound bf nad/ vital signs reviewed  01/18/2016       95  10/16/2015     90 08/31/2015        87  03/23/2015          84  10/21/2014        87  11/19/2013       104  08/21/2013         100     05/31/13 106 lb (48.081 kg)  12/05/12 104 lb (47.174 kg)  10/20/11 121 lb (54.885 kg)    HEENT mild turbinate edema.  Oropharynx no thrush or excess pnd or cobblestoning.  No JVD or cervical adenopathy. Mild accessory muscle hypertrophy. Trachea midline, nl thryroid. Chest was hyperinflated by percussion with diminished breath sounds and moderate increased exp time without wheeze. Hoover sign positive at mid inspiration. Regular rate  and rhythm without murmur gallop or rub or increase P2 or edema.  Abd: no hsm, nl excursion. Ext warm without cyanosis or clubbing.       I personally reviewed images and agree with radiology impression as follows:  CT Chest   01/13/16 1. No pulmonary emboli seen. 2. 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 This predominantly paraseptal with some centrilobular components. 3. Mild right basilar atelectasis.      I personally reviewed images and agree with radiology impression as follows:  CXR: 01/18/2016  Severe chronic bullous emphysema with compressive lower lobe atelectasis but no acute pulmonary infiltrates or effusion.         Assessment & Plan:

## 2016-01-18 NOTE — Progress Notes (Signed)
Subjective:    Patient ID: Anita Murphy, female    DOB: 01-13-1941, 75 y.o.   MRN: [id number]  01/18/2016  Follow-up   HPI This 75 y.o. female presents for TRANSITION INTO CARE:   Admission date: 01/11/2016 Admitting Physician Thurnell Lose, MD  Discharge Date: 01/14/2016   Primary MD [name], MD  Recommendations for primary care physician for things to follow:   Outpatient pulmonary follow-up. Check CBC, BMP and a 2 view chest x-ray in 7-10 days.  Outpatient dermatology follow-up for chronic macular lesions   Admission Diagnosis sob   Discharge Diagnosis sob   Principal Problem:  COPD exacerbation ([place]) Active Problems:  CAP (community acquired pneumonia)  Acute and chronic respiratory failure (acute-on-chronic) ([place])    Past Medical History  Diagnosis Date  . Other diseases of lung, not elsewhere classified   . Allergic rhinitis, cause unspecified   . Personal history of tobacco use, presenting hazards to health   . Elevated blood pressure reading without diagnosis of hypertension   . Chronic airway obstruction, not elsewhere classified   . Other (abnormal) findings on radiological examination of breast     Past Surgical History  Procedure Laterality Date  . Breast surgery Left     4-5 YEARS AGO  . Tubal ligation  1969      HPI from the history and physical done on the day of admission:    Anita Murphy is a 75 y.o. 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 Sherrine Maples history of breast cancer status post breast surgery 5 years ago, no other major medical problems comes to the hospital with 2-3 days of gradually progressive productive cough, shortness of breath and wheezing. Denies any fever chills or recent travel, no exposure to sick contacts, came to the ER where her workup was consistent with acute on chronic hypoxic respiratory failure,  leukocytosis, diagnosis was made of bronchitis was his early pneumonia exacerbating COPD. I was called to admit the patient.  Patient currently denies any fever or chills, no headache, no chest pain palpitations, no abdominal pain, no dysuria, no blood in stool or urine, no focal weakness.   Hospital Course:    1. 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 She will be discharged home with outpatient follow-up with her primary lung physician Dr. Shyrl Numbers. No sepsis this admission.  2. Minimal elevation of troponin. Likely demand ischemia from #1 above. rise in non-ACS pattern. No chest pain, EKG nonacute, continue aspirin, added Bystolic, Stable TTE with preserved EF and wall motion.   3. Multiple dark macular lesions all over. Outpatient dermatology follow-up.    4. Severe pulmonary hypertension with PA pressure of 84. Due to COPD, CT angiogram chest rules out PE.     Discharge Condition: Fair  Follow UP  Follow-up Information    Follow up with SMITH,KRISTI, MD. Schedule an appointment as soon as possible for a visit in 1 week.   Specialty: Family Medicine   Contact information:   [address] [place] [address] [phone]          Review of Systems  Constitutional: Negative for fever, chills, diaphoresis and fatigue.  Eyes: Negative for visual disturbance.  Respiratory: Negative for cough and shortness of breath.   Cardiovascular: Negative for chest pain, palpitations and leg swelling.  Gastrointestinal: Negative for nausea, vomiting, abdominal pain, diarrhea and  constipation.  Endocrine: Negative for cold intolerance, heat intolerance, polydipsia, polyphagia and polyuria.  Neurological: Negative for dizziness, tremors, seizures, syncope, facial asymmetry, speech difficulty, weakness, light-headedness, numbness and headaches.    Past Medical History  Diagnosis Date  . Other diseases of lung, not elsewhere classified   . Allergic rhinitis, cause unspecified   . Personal history of tobacco use, presenting hazards to health   . Elevated blood pressure reading without diagnosis of hypertension   . Chronic airway obstruction, not elsewhere classified   . Other (abnormal) findings on radiological examination of breast    Past Surgical History  Procedure Laterality Date  . Breast surgery Left     4-5 YEARS AGO  . Tubal ligation  1969   No Known Allergies Current Outpatient Prescriptions  Medication Sig Dispense Refill  . Aclidinium Bromide (TUDORZA PRESSAIR) 400 MCG/ACT AEPB Inhale 1 puff into the lungs 2 (two) times daily. One twice daily 3 each 3  . Ascorbic Acid (VITAMIN C) 1000 MG tablet Take 1,000 mg by mouth daily.    Marland Kitchen aspirin 81 MG chewable tablet Chew 81 mg by mouth daily.    Marland Kitchen azithromycin (ZITHROMAX) 500 MG tablet Take 1 tablet (500 mg total) by mouth daily. 6 tablet 0  . budesonide (PULMICORT) 0.25 MG/2ML nebulizer solution Take 2 mLs (0.25 mg total) by nebulization 2 (two) times daily. DX: J96.11; J44.9 60 mL 12  . cefpodoxime (VANTIN) 200 MG tablet Take 1 tablet (200 mg total) by mouth 2 (two) times daily. 12 tablet 0  . dextromethorphan-guaiFENesin (MUCINEX DM) 30-600 MG 12hr tablet Take 1 tablet by mouth at bedtime.    . feeding supplement, ENSURE ENLIVE, (ENSURE ENLIVE) LIQD Take 237 mLs by mouth 2 (two) times daily between meals. 237 mL 12  . formoterol (PERFOROMIST) 20 MCG/2ML nebulizer solution Take 2 mLs (20 mcg total) by nebulization 2 (two) times daily. Use in nebulizer twice daily perfectly regularly  DX: J96.11; J44.9 120 mL  11  . hydrocortisone 2.5 % ointment Apply topically 2 (two) times daily. 30 g 1  . ipratropium (ATROVENT) 0.02 % nebulizer solution Take 2.5 mLs by nebulization every 4 (four) hours as needed for wheezing or shortness of breath.   10  . levalbuterol (XOPENEX HFA) 45 MCG/ACT inhaler Inhale 1-2 puffs into the lungs every 4 (four) hours as needed for wheezing. 1 Inhaler 3  . levalbuterol (XOPENEX) 1.25 MG/3ML nebulizer solution Take 0.63 mg by nebulization every 4 (four) hours as needed for wheezing. DX: J96.11; J44.9 72 mL 12  . mirtazapine (REMERON) 7.5 MG tablet Take 1 tablet (7.5 mg total) by mouth at bedtime. 90 tablet 3  . nebivolol (BYSTOLIC) 2.5 MG tablet Take 1 tablet (2.5 mg total) by mouth daily. 30 tablet 0  . polyethylene glycol powder (GLYCOLAX/MIRALAX) powder MIX 17GM UTD AND TK  PO DAILY  11  . predniSONE (DELTASONE) 5 MG tablet Label  & dispense according to the schedule below. 10 Pills PO for 3 days then, 8 Pills PO for 3 days, 6 Pills PO for 3 days, 4 Pills PO for 3 days, 2 Pills PO for 3 days, 1 Pills PO for 3 days, 1/2 Pill  PO for 3 days then STOP. Total 95 pills.  95 tablet 0   No current facility-administered medications for this visit.   Social History   Social History  . Marital Status: Married    Spouse Name: N/A  . Number of Children: 4  . Years of Education: 12   Occupational History  . 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 Smoking status: Former Smoker -- 1.00 packs/day for 30 years    Types: Cigarettes    Quit date: 12/19/2002  . Smokeless tobacco: Not on file  . Alcohol Use: No  . Drug Use: No  . Sexual Activity: No   Other Topics Concern  . Not on file   Social History Narrative   Always uses seat belts. Smoke alarm and carbon monoxide detector in the home. Guns in the home stored in locked cabinet.       Marital status:  Widowed; married x 52 yrs, happy, no abuse.  Husband with lung cancer and colon cancer.       Lives with one daughter, brother, and grandson.       Exercise: none       Caffeine use: Coffee 2 servings per day.      Employment: retired      ADLs: independent with all ADLs.  No assistant devices for ambulation.  Washes clothes, cooks.        Advanced Directives:  None; DNR/DNI in 2016.   Family History  Problem Relation Age of Onset  . Lung disease Sister   . Hypothyroidism Mother   . Aortic stenosis Mother   . Dementia Mother   . COPD Sister     smoker  . Heart disease Father   . Mesothelioma Father     asbestos exp  . Cancer Father     LUNG       Objective:    BP 139/80 mmHg  Pulse 67  Temp(Src) 98.3 F (36.8 C) (Oral)  Resp 16  Wt 96 lb 6.4 oz (43.727 kg) Physical Exam  Constitutional: She is oriented to person, place, and time. She appears well-developed and well-nourished. No distress.  HENT:  Head: Normocephalic and atraumatic.  Right Ear: External ear normal.  Left Ear: External ear normal.  Nose: Nose normal.  Mouth/Throat: Oropharynx is clear and moist.  Eyes: Conjunctivae and EOM are normal. Pupils are equal, round, and reactive to light.  Neck: Normal range of motion. Neck supple. Carotid bruit is not present. No thyromegaly present.  Cardiovascular: Normal rate, regular rhythm, normal heart sounds and intact distal pulses.  Exam reveals no gallop and no friction rub.   No murmur heard. Pulmonary/Chest: Effort normal and breath sounds normal. She has no wheezes. She has no rales.  Abdominal: Soft. Bowel sounds are normal. She exhibits no distension and no mass. There is no tenderness. There is no rebound and no guarding.  Lymphadenopathy:    She has no cervical adenopathy.  Neurological: She is alert and oriented to person, place, and time. No cranial nerve deficit.  Skin: Skin is warm and dry. No rash noted. She is not diaphoretic. No erythema. No pallor.  Psychiatric: She has a normal mood and affect. Her behavior is normal.   Results for orders  placed or performed during the hospital encounter of 01/11/16  Culture, blood (routine x 2) Call MD if unable to obtain prior to antibiotics being given  Result Value Ref Range   Specimen Description BLOOD RIGHT ANTECUBITAL  Special Requests BOTTLES DRAWN AEROBIC AND ANAEROBIC 10CC    Culture NO GROWTH 5 DAYS    Report Status 01/16/2016 FINAL   Culture, blood (routine x 2) Call MD if unable to obtain prior to antibiotics being given  Result Value Ref Range   Specimen Description BLOOD RIGHT ANTECUBITAL    Special Requests BOTTLES DRAWN AEROBIC AND ANAEROBIC 10CC    Culture NO GROWTH 5 DAYS    Report Status 01/16/2016 FINAL   Culture, sputum-assessment  Result Value Ref Range   Specimen Description SPUTUM    Special Requests NONE    Sputum evaluation      THIS SPECIMEN IS ACCEPTABLE. RESPIRATORY CULTURE REPORT TO FOLLOW.   Report Status 01/11/2016 FINAL   Culture, respiratory (NON-Expectorated)  Result Value Ref Range   Specimen Description SPUTUM    Special Requests NONE    Gram Stain      ABUNDANT WBC PRESENT, PREDOMINANTLY PMN FEW SQUAMOUS EPITHELIAL CELLS PRESENT MODERATE GRAM NEGATIVE COCCOBACILLI MODERATE GRAM POSITIVE COCCI IN PAIRS IN CHAINS Performed at [facility]    Culture      FEW HAEMOPHILUS PARAINFLUENZAE Note: BETA LACTAMASE NEGATIVE Performed at [facility]    Report Status 01/14/2016 FINAL   MRSA PCR Screening  Result Value Ref Range   MRSA by PCR NEGATIVE NEGATIVE  CBC with Differential  Result Value Ref Range   WBC 20.9 (H) 4.0 - 10.5 K/uL   RBC 4.63 3.87 - 5.11 MIL/uL   Hemoglobin 12.5 12.0 - 15.0 g/dL   HCT 40.4 36.0 - 46.0 %   MCV 87.3 78.0 - 100.0 fL   MCH 27.0 26.0 - 34.0 pg   MCHC 30.9 30.0 - 36.0 g/dL   RDW 12.9 11.5 - 15.5 %   Platelets 236 150 - 400 K/uL   Neutrophils Relative % 87 %   Neutro Abs 18.1 (H) 1.7 - 7.7 K/uL   Lymphocytes Relative 6 %   Lymphs Abs 1.2 0.7 - 4.0 K/uL   Monocytes Relative 8 %    Monocytes Absolute 1.6 (H) 0.1 - 1.0 K/uL   Eosinophils Relative 0 %   Eosinophils Absolute 0.0 0.0 - 0.7 K/uL   Basophils Relative 0 %   Basophils Absolute 0.0 0.0 - 0.1 K/uL  Comprehensive metabolic panel  Result Value Ref Range   Sodium 136 135 - 145 mmol/L   Potassium 3.6 3.5 - 5.1 mmol/L   Chloride 93 (L) 101 - 111 mmol/L   CO2 30 22 - 32 mmol/L   Glucose, Bld 146 (H) 65 - 99 mg/dL   BUN <5 (L) 6 - 20 mg/dL   Creatinine, Ser 0.48 0.44 - 1.00 mg/dL   Calcium 9.3 8.9 - 10.3 mg/dL   Total Protein 7.4 6.5 - 8.1 g/dL   Albumin 3.7 3.5 - 5.0 g/dL   AST 33 15 - 41 U/L   ALT 15 14 - 54 U/L   Alkaline Phosphatase 47 38 - 126 U/L   Total Bilirubin 0.7 0.3 - 1.2 mg/dL   GFR calc non Af Amer >60 >60 mL/min   GFR calc Af Amer >60 >60 mL/min   Anion gap 13 5 - 15  Brain natriuretic peptide (only with dyspnea)  Result Value Ref Range   B Natriuretic Peptide 318.3 (H) 0.0 - 100.0 pg/mL  Troponin I (q 6hr x 3)  Result Value Ref Range   Troponin I 0.31 (H) <0.031 ng/mL  Troponin I (q 6hr x 3)  Result Value Ref Range  Troponin I 0.33 (H) <0.031 ng/mL  Troponin I (q 6hr x 3)  Result Value Ref Range   Troponin I 0.33 (H) <0.031 ng/mL  HIV antibody  Result Value Ref Range   HIV Screen 4th Generation wRfx Non Reactive Non Reactive  Strep pneumoniae urinary antigen  Result Value Ref Range   Strep Pneumo Urinary Antigen NEGATIVE NEGATIVE  CBC  Result Value Ref Range   WBC 19.7 (H) 4.0 - 10.5 K/uL   RBC 4.41 3.87 - 5.11 MIL/uL   Hemoglobin 11.7 (L) 12.0 - 15.0 g/dL   HCT 38.1 36.0 - 46.0 %   MCV 86.4 78.0 - 100.0 fL   MCH 26.5 26.0 - 34.0 pg   MCHC 30.7 30.0 - 36.0 g/dL   RDW 12.8 11.5 - 15.5 %   Platelets 224 150 - 400 K/uL  Creatinine, serum  Result Value Ref Range   Creatinine, Ser 0.59 0.44 - 1.00 mg/dL   GFR calc non Af Amer >60 >60 mL/min   GFR calc Af Amer >60 >60 mL/min  Influenza panel by PCR (type A & B, H1N1)  Result Value Ref Range   Influenza A By PCR NEGATIVE  NEGATIVE   Influenza B By PCR NEGATIVE NEGATIVE   H1N1 flu by pcr NOT DETECTED NOT DETECTED  Lactic acid, plasma  Result Value Ref Range   Lactic Acid, Venous 0.9 0.5 - 2.0 mmol/L  CBC  Result Value Ref Range   WBC 20.0 (H) 4.0 - 10.5 K/uL   RBC 4.24 3.87 - 5.11 MIL/uL   Hemoglobin 11.6 (L) 12.0 - 15.0 g/dL   HCT 37.6 36.0 - 46.0 %   MCV 88.7 78.0 - 100.0 fL   MCH 27.4 26.0 - 34.0 pg   MCHC 30.9 30.0 - 36.0 g/dL   RDW 13.3 11.5 - 15.5 %   Platelets 234 150 - 400 K/uL  Basic metabolic panel  Result Value Ref Range   Sodium 141 135 - 145 mmol/L   Potassium 4.6 3.5 - 5.1 mmol/L   Chloride 100 (L) 101 - 111 mmol/L   CO2 32 22 - 32 mmol/L   Glucose, Bld 222 (H) 65 - 99 mg/dL   BUN 8 6 - 20 mg/dL   Creatinine, Ser 0.68 0.44 - 1.00 mg/dL   Calcium 9.0 8.9 - 10.3 mg/dL   GFR calc non Af Amer >60 >60 mL/min   GFR calc Af Amer >60 >60 mL/min   Anion gap 9 5 - 15  Magnesium  Result Value Ref Range   Magnesium 2.0 1.7 - 2.4 mg/dL  Glucose, capillary  Result Value Ref Range   Glucose-Capillary 145 (H) 65 - 99 mg/dL   Comment 1 Capillary Specimen   I-stat troponin, ED (not at [facility])  Result Value Ref Range   Troponin i, poc 0.26 (HH) 0.00 - 0.08 ng/mL   Comment NOTIFIED PHYSICIAN    Comment 3          I-Stat CG4 Lactic Acid, ED  Result Value Ref Range   Lactic Acid, Venous 2.90 (HH) 0.5 - 2.0 mmol/L   Comment NOTIFIED PHYSICIAN   I-Stat arterial blood gas, ED  Result Value Ref Range   pH, Arterial 7.337 (L) 7.350 - 7.450   pCO2 arterial 60.5 (HH) 35.0 - 45.0 mmHg   pO2, Arterial 82.0 80.0 - 100.0 mmHg   Bicarbonate 32.4 (H) 20.0 - 24.0 mEq/L   TCO2 34 0 - 100 mmol/L   O2 Saturation 95.0 %   Acid-Base  Excess 5.0 (H) 0.0 - 2.0 mmol/L   Patient temperature 98.5 F    Collection site RADIAL, ALLEN'S TEST ACCEPTABLE    Drawn by RT    Sample type ARTERIAL    Comment NOTIFIED PHYSICIAN   I-Stat CG4 Lactic Acid, ED  Result Value Ref Range   Lactic Acid, Venous 4.06 (HH) 0.5  - 2.0 mmol/L   Comment NOTIFIED PHYSICIAN        Assessment & Plan:  No diagnosis found.  No orders of the defined types were placed in this encounter.   No orders of the defined types were placed in this encounter.    No Follow-up on file.    Kristi Elayne Guerin, M.D. [facility] [address] [place], [place]  [address] [phone] phone [phone] fax

## 2016-01-19 ENCOUNTER — Other Ambulatory Visit: Payer: Self-pay | Admitting: Internal Medicine

## 2016-01-19 ENCOUNTER — Telehealth: Payer: Self-pay

## 2016-01-19 NOTE — Telephone Encounter (Signed)
Pt daughter tried to schedule an appt with dr Katrinka Blazing for a 3 month follow up but the next available is in June and the daughter would like to know if this is ok to wait that long   Best number [phone]

## 2016-01-20 ENCOUNTER — Encounter: Payer: Self-pay | Admitting: Internal Medicine

## 2016-01-20 LAB — CBC WITH DIFFERENTIAL/PLATELET
BASOS PCT: 0 % (ref 0–1)
Basophils Absolute: 0 10*3/uL (ref 0.0–0.1)
EOS ABS: 0 10*3/uL (ref 0.0–0.7)
Eosinophils Relative: 0 % (ref 0–5)
HCT: 44 % (ref 36.0–46.0)
HEMOGLOBIN: 13.1 g/dL (ref 12.0–15.0)
LYMPHS ABS: 0.8 10*3/uL (ref 0.7–4.0)
Lymphocytes Relative: 5 % — ABNORMAL LOW (ref 12–46)
MCH: 26.5 pg (ref 26.0–34.0)
MCHC: 29.8 g/dL — AB (ref 30.0–36.0)
MCV: 88.9 fL (ref 78.0–100.0)
MONO ABS: 0.2 10*3/uL (ref 0.1–1.0)
MONOS PCT: 1 % — AB (ref 3–12)
MPV: 9.7 fL (ref 8.6–12.4)
NEUTROS ABS: 14.2 10*3/uL — AB (ref 1.7–7.7)
Neutrophils Relative %: 94 % — ABNORMAL HIGH (ref 43–77)
Platelets: 365 10*3/uL (ref 150–400)
RBC: 4.95 MIL/uL (ref 3.87–5.11)
RDW: 13.7 % (ref 11.5–15.5)
WBC: 15.1 10*3/uL — ABNORMAL HIGH (ref 4.0–10.5)

## 2016-01-20 LAB — COMPREHENSIVE METABOLIC PANEL
ALBUMIN: 3.6 g/dL (ref 3.6–5.1)
ALT: 58 U/L — ABNORMAL HIGH (ref 6–29)
AST: 25 U/L (ref 10–35)
Alkaline Phosphatase: 46 U/L (ref 33–130)
BUN: 9 mg/dL (ref 7–25)
CHLORIDE: 93 mmol/L — AB (ref 98–110)
CO2: 34 mmol/L — AB (ref 20–31)
CREATININE: 0.51 mg/dL — AB (ref 0.60–0.93)
Calcium: 9 mg/dL (ref 8.6–10.4)
Glucose, Bld: 127 mg/dL — ABNORMAL HIGH (ref 65–99)
POTASSIUM: 3.8 mmol/L (ref 3.5–5.3)
SODIUM: 140 mmol/L (ref 135–146)
TOTAL PROTEIN: 6.8 g/dL (ref 6.1–8.1)
Total Bilirubin: 0.3 mg/dL (ref 0.2–1.2)

## 2016-01-20 LAB — HEMOGLOBIN A1C
HEMOGLOBIN A1C: 6.2 % — AB (ref <5.7)
MEAN PLASMA GLUCOSE: 131 mg/dL — AB (ref <117)

## 2016-01-20 NOTE — Assessment & Plan Note (Signed)
D/c on 2lpm 07/24/15 from [facility]  - HCO3  01/18/16   =  34   Though somewhat paradoxic, when the lung fails to clear C02 properly and pC02 rises the lung then becomes a more efficient scavenger of C02 allowing lower work of breathing and  better C02 clearance albeit at a higher serum pC02 level - this is why pts can look a lot better than their ABG's would suggest and why it's so difficult to prognosticate endstage dz.  It's also why I strongly rec DNI status (ventilating pts down to a nl pC02 adversely affects this compensatory mechanism)   No change in rx needed

## 2016-01-20 NOTE — Telephone Encounter (Signed)
Dr Katrinka Blazing, if you want to order the portable O2, if you will just write in on order what you want to order and sign, I will fill in the rest of the pt info and fax to [facility]. Please see the 1st message from daughter below also.

## 2016-01-20 NOTE — Assessment & Plan Note (Signed)
-  Spirometry 12/05/12 FEV1  0.51 (30% ratio 40    - PFT's  08/21/2013   FEV1  0.52 ( 33%) ratio 42 and no change B2 - 08/31/2015 p extensive coaching HFA effectiveness =  HFA  0/  90% with dpi > rec tudorza and pulm/perforomist bid neb - could not afford turdorza so changed to qid atrovent 09/19/15   She has very severe dz but well compensated on present rx and no need for "fine tuning" at this poin with rx  duoneb qid and pulmocort bid  I had an extended final summary discussion with the patient and daughters reviewing all relevant studies completed to date and  lasting 15 to 20 minutes of a 25 minute visit on the following issues:    Each maintenance medication was reviewed in detail including most importantly the difference between maintenance and as needed and under what circumstances the prns are to be used.  Please see instructions for details which were reviewed in writing and the patient given a copy.

## 2016-01-20 NOTE — Telephone Encounter (Signed)
Daughter also called back and reported that [facility] told her that they need a Rx for a portable O2 unit in order to supply it for pt. Dr Katrinka Blazing, do you want to order this, or is Dr [name] managing her O2? I will put a [facility] order form in your box for pt.

## 2016-01-22 ENCOUNTER — Telehealth: Payer: Self-pay | Admitting: Internal Medicine

## 2016-01-22 NOTE — Telephone Encounter (Signed)
Return call -- 1. I think it is fine for patient to follow-up with me in June (which is four month follow-up); please schedule a follow-up with me in June 2017.  2.  Dr. [name] is her pulmonologist and should ultimately be managing her oxygen supplementation.  Can we fax [facility] form to Dr. [name]?

## 2016-01-22 NOTE — Telephone Encounter (Signed)
Called [facility] in [place] who supplies pt's O2 and asked them to contact Dr Thurston Hole office to see if he will order the portable O2 as pt's pulmonologist. I gave them Dr Thurston Hole ph and fax #s and they agreed to send order to him. Called pt's daughter, Anita Murphy, and advised. She was in agreement with that plan. Set up appt for pt on 06/14/16 for 4 mos f/up.

## 2016-01-22 NOTE — Telephone Encounter (Signed)
Spoke with insurance at below number to initiate PA for perforomist.  PA was done over the phone.  Turnaround time for a determination is up to 72 hours.  Reference #- [id number]  Will hold for follow up

## 2016-01-25 ENCOUNTER — Encounter: Payer: Self-pay | Admitting: Family Medicine

## 2016-01-25 NOTE — Telephone Encounter (Signed)
atc pharmacy X2, was on hold for over 10 minutes both times.  Wcb.

## 2016-01-25 NOTE — Telephone Encounter (Signed)
Received letter from [facility] stating that Perforomist was denied under medicare part D, but it is covered under her part B  ATC the pharmacy, long hold time, [facility]

## 2016-01-26 MED ORDER — FORMOTEROL FUMARATE 20 MCG/2ML IN NEBU
INHALATION_SOLUTION | RESPIRATORY_TRACT | Status: DC
Start: 2016-01-26 — End: 2017-02-01

## 2016-01-26 NOTE — Telephone Encounter (Signed)
Called the pharm again and on hold x 10 min  I have sent them a new rx for med to be filed under part B medicare

## 2016-02-02 ENCOUNTER — Telehealth: Payer: Self-pay

## 2016-02-02 NOTE — Telephone Encounter (Signed)
Pt got the lab results and has a few questions  Best call [phone]

## 2016-02-02 NOTE — Telephone Encounter (Signed)
All questions answered. Deliah Boston, MS, PA-C 3:15 PM, 02/02/2016

## 2016-06-14 ENCOUNTER — Ambulatory Visit (INDEPENDENT_AMBULATORY_CARE_PROVIDER_SITE_OTHER): Payer: Medicare PPO | Admitting: Family Medicine

## 2016-06-14 ENCOUNTER — Encounter: Payer: Self-pay | Admitting: Family Medicine

## 2016-06-14 VITALS — BP 152/96 | HR 63 | Temp 98.2°F | Resp 16 | Ht 63.0 in | Wt 95.6 lb

## 2016-06-14 DIAGNOSIS — R030 Elevated blood-pressure reading, without diagnosis of hypertension: Secondary | ICD-10-CM

## 2016-06-14 DIAGNOSIS — R21 Rash and other nonspecific skin eruption: Secondary | ICD-10-CM | POA: Diagnosis not present

## 2016-06-14 DIAGNOSIS — IMO0001 Reserved for inherently not codable concepts without codable children: Secondary | ICD-10-CM

## 2016-06-14 DIAGNOSIS — J9612 Chronic respiratory failure with hypercapnia: Secondary | ICD-10-CM

## 2016-06-14 DIAGNOSIS — R634 Abnormal weight loss: Secondary | ICD-10-CM

## 2016-06-14 DIAGNOSIS — J449 Chronic obstructive pulmonary disease, unspecified: Secondary | ICD-10-CM | POA: Diagnosis not present

## 2016-06-14 DIAGNOSIS — J9611 Chronic respiratory failure with hypoxia: Secondary | ICD-10-CM

## 2016-06-14 MED ORDER — LEVALBUTEROL HCL 1.25 MG/3ML IN NEBU: 0.6300 mg | INHALATION_SOLUTION | RESPIRATORY_TRACT | Status: DC | PRN

## 2016-06-14 MED ORDER — MIRTAZAPINE 7.5 MG PO TABS: 7.5000 mg | ORAL_TABLET | Freq: Every day | ORAL | Status: DC

## 2016-06-14 MED ORDER — KETOCONAZOLE 2 % EX CREA: 1.0000 "application " | TOPICAL_CREAM | Freq: Two times a day (BID) | CUTANEOUS | Status: AC

## 2016-06-14 MED ORDER — LEVALBUTEROL TARTRATE 45 MCG/ACT IN AERO: 1.0000 | INHALATION_SPRAY | RESPIRATORY_TRACT | Status: DC | PRN

## 2016-06-14 NOTE — Patient Instructions (Signed)
     IF you received an x-ray today, you will receive an invoice from [facility]. Please contact  Radiology at [phone] with questions or concerns regarding your invoice.   IF you received labwork today, you will receive an invoice from [facility]. Please contact [facility] at [phone] with questions or concerns regarding your invoice.   Our billing staff will not be able to assist you with questions regarding bills from these companies.  You will be contacted with the lab results as soon as they are available. The fastest way to get your results is to activate your My Chart account. Instructions are located on the last page of this paperwork. If you have not heard from us regarding the results in 2 weeks, please contact this office.      

## 2016-06-14 NOTE — Progress Notes (Signed)
Subjective:    Patient ID: Anita Murphy, female    DOB: April 15, 1941, 75 y.o.   MRN: [id number]  06/14/2016  Follow-up and Medication Refill   HPI This 75 y.o. female presents for five month follow-up:   1..  Underweight:  Weight 95 pounds.  Not taking MVI currently.  Not taking Vitamin C.  Dairy -- yogurt, goats milk and cheese, cheese daily, ice cream every day.  Ensure two daily.    2. COPD: no wheelchair.  Moving around well.  Walking around fine; plans to do more; does not do more.  Does not ride with daughter much.  If goes, must carry medication.   Performist and budesonide together bid.  Atrovent every six hours.  Wearing 2 liters throughout the day.    3.  Constipation:  Miralax qod or every 2 days.  Was having urinary incontinence with respiratory distress.     Review of Systems  Constitutional: Negative for fever, chills, diaphoresis and fatigue.  Eyes: Negative for visual disturbance.  Respiratory: Negative for cough and shortness of breath.   Cardiovascular: Negative for chest pain, palpitations and leg swelling.  Gastrointestinal: Negative for nausea, vomiting, abdominal pain, diarrhea and constipation.  Endocrine: Negative for cold intolerance, heat intolerance, polydipsia, polyphagia and polyuria.  Neurological: Negative for dizziness, tremors, seizures, syncope, facial asymmetry, speech difficulty, weakness, light-headedness, numbness and headaches.    Past Medical History  Diagnosis Date  . Other diseases of lung, not elsewhere classified   . Allergic rhinitis, cause unspecified   . Personal history of tobacco use, presenting hazards to health   . Elevated blood pressure reading without diagnosis of hypertension   . Chronic airway obstruction, not elsewhere classified   . Other (abnormal) findings on radiological examination of breast    Past Surgical History  Procedure Laterality Date  . Breast surgery Left     4-5 YEARS AGO  . Tubal ligation  1969    No Known Allergies Current Outpatient Prescriptions  Medication Sig Dispense Refill  . aspirin 81 MG chewable tablet Chew 81 mg by mouth daily.    . budesonide (PULMICORT) 0.25 MG/2ML nebulizer solution Take 2 mLs (0.25 mg total) by nebulization 2 (two) times daily. DX: J96.11; J44.9 60 mL 12  . feeding supplement, ENSURE ENLIVE, (ENSURE ENLIVE) LIQD Take 237 mLs by mouth 2 (two) times daily between meals. 237 mL 12  . formoterol (PERFOROMIST) 20 MCG/2ML nebulizer solution USE 1 VIAL VIA NEBULIZER TWICE DAILY PERFECTLY REGULARLY 360 mL 11  . ipratropium (ATROVENT) 0.02 % nebulizer solution Take 2.5 mLs by nebulization every 4 (four) hours as needed for wheezing or shortness of breath.   10  . levalbuterol (XOPENEX HFA) 45 MCG/ACT inhaler Inhale 1-2 puffs into the lungs every 4 (four) hours as needed for wheezing. 1 Inhaler 3  . mirtazapine (REMERON) 7.5 MG tablet Take 1 tablet (7.5 mg total) by mouth at bedtime. 90 tablet 3  . polyethylene glycol powder (GLYCOLAX/MIRALAX) powder MIX 17GM UTD AND TK  PO DAILY  11  . Ascorbic Acid (VITAMIN C) 1000 MG tablet Take 1,000 mg by mouth daily. Reported on 06/14/2016    . cefpodoxime (VANTIN) 200 MG tablet Take 1 tablet (200 mg total) by mouth 2 (two) times daily. (Patient not taking: Reported on 06/14/2016) 12 tablet 0  . hydrocortisone 2.5 % ointment Apply topically 2 (two) times daily. (Patient not taking: Reported on 06/14/2016) 30 g 1  . ketoconazole (NIZORAL) 2 % cream Apply 1 application topically 2 (  two) times daily. 30 g 0  . levalbuterol (XOPENEX) 1.25 MG/3ML nebulizer solution Take 1.25 mg by nebulization every 4 (four) hours as needed for wheezing. DX: J96.11; J44.9 72 mL 12  . nebivolol (BYSTOLIC) 2.5 MG tablet Take 1 tablet (2.5 mg total) by mouth daily. (Patient not taking: Reported on 06/14/2016) 30 tablet 5  . predniSONE (DELTASONE) 5 MG tablet Label  & dispense according to the schedule below. 10 Pills PO for 3 days then, 8 Pills PO for 3  days, 6 Pills PO for 3 days, 4 Pills PO for 3 days, 2 Pills PO for 3 days, 1 Pills PO for 3 days, 1/2 Pill  PO for 3 days then STOP. Total 95 pills. (Patient not taking: Reported on 06/14/2016) 95 tablet 0   No current facility-administered medications for this visit.   Social History   Social History  . Marital Status: Married    Spouse Name: N/A  . Number of Children: 4  . Years of Education: 12   Occupational History  . 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 Smoking status: Former Smoker -- 1.00 packs/day for 30 years    Types: Cigarettes    Quit date: 12/19/2002  . Smokeless tobacco: Not on file  . Alcohol Use: No  . Drug Use: No  . Sexual Activity: No   Other Topics Concern  . Not on file   Social History Narrative   Always uses seat belts. Smoke alarm and carbon monoxide detector in the home. Guns in the home stored in locked cabinet.       Marital status:  Widowed; married x 52 yrs, happy, no abuse.  Husband with lung cancer and colon cancer.      Lives with one daughter, brother, and grandson.       Exercise: none       Caffeine use: Coffee 2 servings per day.      Employment: retired      ADLs: independent with all ADLs.  No assistant devices for ambulation.  Washes clothes, cooks.        Advanced Directives:  None; DNR/DNI in 2016.   Family History  Problem Relation Age of Onset  . Lung disease Sister   . Hypothyroidism Mother   . Aortic stenosis Mother   . Dementia Mother   . COPD Sister     smoker  . Heart disease Father   . Mesothelioma Father     asbestos exp  . Cancer Father     LUNG       Objective:    BP 152/96 mmHg  Pulse 63  Temp(Src) 98.2 F (36.8 C) (Oral)  Resp 16  Ht _0  (1.6 m)  Wt 95 lb 9.6 oz (43.364 kg)  BMI 16.94 kg/m2  SpO2 96% Physical Exam  Constitutional: She is oriented to person, place, and time. She appears well-developed and well-nourished. No distress.  HENT:  Head:  Normocephalic and atraumatic.  Right Ear: External ear normal.  Left Ear: External ear normal.  Nose: Nose normal.  Mouth/Throat: Oropharynx is clear and moist.  Eyes: Conjunctivae and EOM are normal. Pupils are equal, round, and reactive to light.  Neck: Normal range of motion. Neck supple. Carotid bruit is not present. No thyromegaly present.  Cardiovascular: Normal rate, regular rhythm, normal heart sounds and intact distal pulses.  Exam reveals no gallop and no friction rub.   No murmur heard. Pulmonary/Chest:  Effort normal and breath sounds normal. She has no wheezes. She has no rales.  Abdominal: Soft. Bowel sounds are normal. She exhibits no distension and no mass. There is no tenderness. There is no rebound and no guarding.  Lymphadenopathy:    She has no cervical adenopathy.  Neurological: She is alert and oriented to person, place, and time. No cranial nerve deficit.  Skin: Skin is warm and dry. No rash noted. She is not diaphoretic. No erythema. No pallor.  Hyperpigmented annular lesions B elbows extensor surfaces and forearms.   Psychiatric: She has a normal mood and affect. Her behavior is normal.   Results for orders placed or performed in visit on 01/18/16  CBC with Differential/Platelet  Result Value Ref Range   WBC 15.1 (H) 4.0 - 10.5 K/uL   RBC 4.95 3.87 - 5.11 MIL/uL   Hemoglobin 13.1 12.0 - 15.0 g/dL   HCT 44.0 36.0 - 46.0 %   MCV 88.9 78.0 - 100.0 fL   MCH 26.5 26.0 - 34.0 pg   MCHC 29.8 (L) 30.0 - 36.0 g/dL   RDW 13.7 11.5 - 15.5 %   Platelets 365 150 - 400 K/uL   MPV 9.7 8.6 - 12.4 fL   Neutrophils Relative % 94 (H) 43 - 77 %   Neutro Abs 14.2 (H) 1.7 - 7.7 K/uL   Lymphocytes Relative 5 (L) 12 - 46 %   Lymphs Abs 0.8 0.7 - 4.0 K/uL   Monocytes Relative 1 (L) 3 - 12 %   Monocytes Absolute 0.2 0.1 - 1.0 K/uL   Eosinophils Relative 0 0 - 5 %   Eosinophils Absolute 0.0 0.0 - 0.7 K/uL   Basophils Relative 0 0 - 1 %   Basophils Absolute 0.0 0.0 - 0.1 K/uL    Smear Review Criteria for review not met   Comprehensive metabolic panel  Result Value Ref Range   Sodium 140 135 - 146 mmol/L   Potassium 3.8 3.5 - 5.3 mmol/L   Chloride 93 (L) 98 - 110 mmol/L   CO2 34 (H) 20 - 31 mmol/L   Glucose, Bld 127 (H) 65 - 99 mg/dL   BUN 9 7 - 25 mg/dL   Creat 0.51 (L) 0.60 - 0.93 mg/dL   Total Bilirubin 0.3 0.2 - 1.2 mg/dL   Alkaline Phosphatase 46 33 - 130 U/L   AST 25 10 - 35 U/L   ALT 58 (H) 6 - 29 U/L   Total Protein 6.8 6.1 - 8.1 g/dL   Albumin 3.6 3.6 - 5.1 g/dL   Calcium 9.0 8.6 - 10.4 mg/dL  Hemoglobin A1c  Result Value Ref Range   Hgb A1c MFr Bld 6.2 (H) <5.7 %   Mean Plasma Glucose 131 (H) <117 mg/dL       Assessment & Plan:   1. COPD III   2. Chronic respiratory failure with hypoxia and hypercapnia (HCC)   3. Blood pressure elevated   4. Elevated blood pressure   5. Rash and nonspecific skin eruption   6. Weight loss     Orders Placed This Encounter  Procedures  . Ambulatory referral to Dermatology    Referral Priority:  Routine    Referral Type:  Consultation    Referral Reason:  Specialty Services Required    Requested Specialty:  Dermatology    Number of Visits Requested:  1   Meds ordered this encounter  Medications  . mirtazapine (REMERON) 7.5 MG tablet    Sig: Take 1 tablet (  7.5 mg total) by mouth at bedtime.    Dispense:  90 tablet    Refill:  3  . ketoconazole (NIZORAL) 2 % cream    Sig: Apply 1 application topically 2 (two) times daily.    Dispense:  30 g    Refill:  0  . DISCONTD: levalbuterol (XOPENEX) 1.25 MG/3ML nebulizer solution    Sig: Take 0.63 mg by nebulization every 4 (four) hours as needed for wheezing. DX: J96.11; J44.9    Dispense:  72 mL    Refill:  12  . levalbuterol (XOPENEX HFA) 45 MCG/ACT inhaler    Sig: Inhale 1-2 puffs into the lungs every 4 (four) hours as needed for wheezing.    Dispense:  1 Inhaler    Refill:  3    Return in about 4 months (around 10/14/2016) for  recheck.    Kristianna Saperstein Elayne Guerin, M.D. [facility] [address] [place], [place]  [address] [phone] phone [phone] fax

## 2016-06-16 ENCOUNTER — Telehealth: Payer: Self-pay

## 2016-06-16 NOTE — Telephone Encounter (Signed)
[facility] [place] [facility] is calling to get clarification for the xopenex prescription. The prescription was prescribed for 1.25 MG and the instructions says to take 0.63. Please call pharmacy!

## 2016-06-16 NOTE — Telephone Encounter (Signed)
Dr Katrinka BlazingSmith, do you want pt to use 1/2 vial each neb? I can send Rx back in with that sig.

## 2016-06-17 ENCOUNTER — Telehealth: Payer: Self-pay

## 2016-06-17 MED ORDER — LEVALBUTEROL HCL 1.25 MG/3ML IN NEBU: 1.2500 mg | INHALATION_SOLUTION | RESPIRATORY_TRACT | Status: DC | PRN

## 2016-06-17 NOTE — Telephone Encounter (Signed)
Rx corrected and escribed to pharmacy.

## 2016-06-17 NOTE — Telephone Encounter (Signed)
PA for Xopenex-Levalbuterol Tartrate approved thru CoverMyMeds

## 2016-06-24 ENCOUNTER — Telehealth: Payer: Self-pay

## 2016-06-24 ENCOUNTER — Ambulatory Visit (INDEPENDENT_AMBULATORY_CARE_PROVIDER_SITE_OTHER): Payer: Medicare PPO | Admitting: Physician Assistant

## 2016-06-24 VITALS — BP 120/80 | HR 104 | Temp 98.6°F | Resp 20 | Ht 62.0 in | Wt 94.2 lb

## 2016-06-24 DIAGNOSIS — R059 Cough, unspecified: Secondary | ICD-10-CM

## 2016-06-24 DIAGNOSIS — R05 Cough: Secondary | ICD-10-CM

## 2016-06-24 DIAGNOSIS — J441 Chronic obstructive pulmonary disease with (acute) exacerbation: Secondary | ICD-10-CM

## 2016-06-24 DIAGNOSIS — R0602 Shortness of breath: Secondary | ICD-10-CM | POA: Diagnosis not present

## 2016-06-24 LAB — POCT CBC
Granulocyte percent: 60 %G (ref 37–80)
HEMATOCRIT: 38.9 % (ref 37.7–47.9)
Hemoglobin: 12.9 g/dL (ref 12.2–16.2)
Lymph, poc: 2.7 (ref 0.6–3.4)
MCH: 27.1 pg (ref 27–31.2)
MCHC: 33 g/dL (ref 31.8–35.4)
MCV: 82 fL (ref 80–97)
MID (CBC): 0.7 (ref 0–0.9)
MPV: 6.9 fL (ref 0–99.8)
POC GRANULOCYTE: 5.1 (ref 2–6.9)
POC LYMPH PERCENT: 32.3 %L (ref 10–50)
POC MID %: 7.7 % (ref 0–12)
Platelet Count, POC: 231 10*3/uL (ref 142–424)
RBC: 4.75 M/uL (ref 4.04–5.48)
RDW, POC: 13.5 %
WBC: 8.5 10*3/uL (ref 4.6–10.2)

## 2016-06-24 MED ORDER — ALBUTEROL SULFATE (2.5 MG/3ML) 0.083% IN NEBU
2.5000 mg | INHALATION_SOLUTION | Freq: Once | RESPIRATORY_TRACT | Status: AC
  Administered 2016-06-24: 2.5 mg via RESPIRATORY_TRACT

## 2016-06-24 MED ORDER — DOXYCYCLINE HYCLATE 100 MG PO CAPS: 100.0000 mg | ORAL_CAPSULE | Freq: Two times a day (BID) | ORAL | Status: AC

## 2016-06-24 MED ORDER — IPRATROPIUM BROMIDE 0.02 % IN SOLN
0.5000 mg | Freq: Once | RESPIRATORY_TRACT | Status: AC
  Administered 2016-06-24: 0.5 mg via RESPIRATORY_TRACT

## 2016-06-24 MED ORDER — PREDNISONE 20 MG PO TABS: ORAL_TABLET | ORAL | Status: AC

## 2016-06-24 NOTE — Progress Notes (Signed)
06/25/2016 8:42 AM   DOB: 1941/12/02 / MRN: [id number]  SUBJECTIVE:  Anita Murphy is a 75 y.o. female presenting for cough, chest congestion, and SOB. She is a patient of Dr. Michaelle CopasSmith's and has a long history of COPD thought to be 2/2 a 30 pack year history of smoking.  She is accompanied by her daughter who is concerned because she has noticed the patient is coughing more often than normal and complaining of increased SOB. They have tried albuterol nebs and this has helped some.  She takes Pulmocort and formoterol and has been compliant with these.  No recent changes in medications. Denies chest pain and leg swelling.   She has No Known Allergies.   She  has a past medical history of Other diseases of lung, not elsewhere classified; Allergic rhinitis, cause unspecified; Personal history of tobacco use, presenting hazards to health; Elevated blood pressure reading without diagnosis of hypertension; Chronic airway obstruction, not elsewhere classified; and Other (abnormal) findings on radiological examination of breast.    She  reports that she quit smoking about 13 years ago. Her smoking use included Cigarettes. She has a 30 pack-year smoking history. She does not have any smokeless tobacco history on file. She reports that she does not drink alcohol or use illicit drugs. She  reports that she does not engage in sexual activity. The patient  has past surgical history that includes Breast surgery (Left) and Tubal ligation (1969).  Her family history includes Aortic stenosis in her mother; COPD in her sister; Cancer in her father; Dementia in her mother; Heart disease in her father; Hypothyroidism in her mother; Lung disease in her sister; Mesothelioma in her father.  Review of Systems  Constitutional: Negative for fever and chills.  Respiratory: Positive for sputum production, shortness of breath and wheezing (inspiratory and expiratory, improved with 1 round of nebs).   Skin: Negative for itching  and rash.  Neurological: Negative for dizziness.    Problem list and medications reviewed and updated by myself where necessary, and exist elsewhere in the encounter.   OBJECTIVE:  BP 120/80 mmHg  Pulse 104  Temp(Src) 98.6 F (37 C) (Oral)  Resp 20  Ht 5\' 2"  (1.575 m)  Wt 94 lb 3.2 oz (42.729 kg)  BMI 17.23 kg/m2  SpO2 98%  Physical Exam  Constitutional: She is oriented to person, place, and time. She appears well-nourished. No distress.  Eyes: EOM are normal. Pupils are equal, round, and reactive to light.  Cardiovascular: Normal rate.   Pulmonary/Chest: Effort normal.  Abdominal: She exhibits no distension.  Neurological: She is alert and oriented to person, place, and time. No cranial nerve deficit. Gait normal.  Skin: Skin is dry. She is not diaphoretic.  Psychiatric: She has a normal mood and affect.  Vitals reviewed.   Results for orders placed or performed in visit on 06/24/16  POCT CBC  Result Value Ref Range   WBC 8.5 4.6 - 10.2 K/uL   Lymph, poc 2.7 0.6 - 3.4   POC LYMPH PERCENT 32.3 10 - 50 %L   MID (cbc) 0.7 0 - 0.9   POC MID % 7.7 0 - 12 %M   POC Granulocyte 5.1 2 - 6.9   Granulocyte percent 60.0 37 - 80 %G   RBC 4.75 4.04 - 5.48 M/uL   Hemoglobin 12.9 12.2 - 16.2 g/dL   HCT, POC 40.938.9 81.137.7 - 47.9 %   MCV 82.0 80 - 97 fL   MCH, POC 27.1 27 -  31.2 pg   MCHC 33.0 31.8 - 35.4 g/dL   RDW, POC 16.113.5 %   Platelet Count, POC 231 142 - 424 K/uL   MPV 6.9 0 - 99.8 fL    No results found.  ASSESSMENT AND PLAN  Anita Murphy was seen today for cough and hoarseness.  Diagnoses and all orders for this visit:  COPD exacerbation (HCC): CHL reveals she has severe disease. Vitals stable today.  CBC reassuring when compared to her last hospitalization for similar symptoms.  Will treat outpatient however advised that she return in 3 days to ensure she is improving.   -     doxycycline (VIBRAMYCIN) 100 MG capsule; Take 1 capsule (100 mg total) by mouth 2 (two) times  daily. -     predniSONE (DELTASONE) 20 MG tablet; Take 3 in the morning for 3 days, then 2 in the morning for 3 days, and then 1 in the morning for 3 days.  SOB (shortness of breath) -     albuterol (PROVENTIL) (2.5 MG/3ML) 0.083% nebulizer solution 2.5 mg; Take 3 mLs (2.5 mg total) by nebulization once. -     ipratropium (ATROVENT) nebulizer solution 0.5 mg; Take 2.5 mLs (0.5 mg total) by nebulization once.  Cough -     POCT CBC    The patient was advised to call or return to clinic if she does not see an improvement in symptoms, or to seek the care of the closest emergency department if she worsens with the above plan.   Deliah BostonMichael Trejuan Matherne, MHS, PA-C [facility] 06/25/2016 8:42 AM

## 2016-06-24 NOTE — Patient Instructions (Signed)
     IF you received an x-ray today, you will receive an invoice from [facility]. Please contact [facility] at [phone] with questions or concerns regarding your invoice.   IF you received labwork today, you will receive an invoice from [facility]. Please contact [facility] at [phone] with questions or concerns regarding your invoice.   Our billing staff will not be able to assist you with questions regarding bills from these companies.  You will be contacted with the lab results as soon as they are available. The fastest way to get your results is to activate your My Chart account. Instructions are located on the last page of this paperwork. If you have not heard from us regarding the results in 2 weeks, please contact this office.      

## 2016-06-24 NOTE — Telephone Encounter (Signed)
Advised pt's daughter to return to clinic.

## 2016-06-24 NOTE — Telephone Encounter (Signed)
Pt daughter is calling to see if someone can call her mother to discuss symptoms pt was just seen last week and feels she is having copd episode Best number pt [phone]   Best number for daughter if pt needs to be seen   [phone] shirley she will be the one to bring her

## 2016-06-27 ENCOUNTER — Telehealth: Payer: Self-pay | Admitting: Internal Medicine

## 2016-06-27 NOTE — Telephone Encounter (Signed)
ATC. VM full WCB 

## 2016-06-27 NOTE — Telephone Encounter (Signed)
Per 01/18/16 OV; Patient Instructions     02 2lpm 24/7  For cough/ congestion > mucinex or mucinex dm up 1200 mg every 12 hours and use the flutter valve as much as you can   Wean off prednisone and call if breathing or coughing get worse to see me or my NP Tammy   -  Called spoke with pt. She reports her xopenex is not covered by insurance. They are wanting to change to albuterol. Please advise Dr. Sherene SiresWert thanks

## 2016-06-27 NOTE — Telephone Encounter (Signed)
That's fine and if any problems with side effects just cut dose in half

## 2016-06-28 NOTE — Telephone Encounter (Signed)
Spoke with pt,aware of recs.  Nothing further needed.  

## 2016-08-03 ENCOUNTER — Other Ambulatory Visit: Payer: Self-pay | Admitting: Student

## 2016-08-04 ENCOUNTER — Other Ambulatory Visit: Payer: Self-pay | Admitting: Student

## 2016-08-08 ENCOUNTER — Other Ambulatory Visit: Payer: Self-pay | Admitting: Family Medicine

## 2016-08-08 NOTE — Telephone Encounter (Signed)
Pt is requesting refill for  Original Order:  budesonide (PULMICORT) 0.25 MG/2ML nebulizer solution [[id number]][[id number]]    Pharmacy:  [id number] [place] -     [phone] please call

## 2016-08-16 ENCOUNTER — Telehealth: Payer: Self-pay

## 2016-08-16 NOTE — Telephone Encounter (Signed)
[facility] faxed orders for review and signature. Also asked for 6/27 F2F OV notes which I have printed along with more recent 7/7 OV and attached to order and will place in Dr [facility] Ilsley[facility]'s box.

## 2016-09-16 ENCOUNTER — Other Ambulatory Visit: Payer: Self-pay | Admitting: Family Medicine

## 2016-09-16 ENCOUNTER — Other Ambulatory Visit: Payer: Self-pay | Admitting: Physician Assistant

## 2016-09-16 ENCOUNTER — Telehealth: Payer: Self-pay

## 2016-09-16 NOTE — Telephone Encounter (Signed)
Pt is needing a refill on budesonide     Best number [phone]

## 2016-09-17 NOTE — Telephone Encounter (Signed)
REQUEST FOR BUDESONIDE (PULMICORT) NEB SOLUTION  PT HAS APPT 10/25 WITH DR. Katrinka BlazingSMITH.

## 2016-09-17 NOTE — Telephone Encounter (Signed)
KEEP APPT 10/25 WITH DR Katrinka BlazingSMITH FOR FUTURE REFILLS

## 2016-09-19 NOTE — Telephone Encounter (Addendum)
This was refilled 09/17/16. Pt. Mailbox is full, could not advise patient.

## 2016-09-30 ENCOUNTER — Other Ambulatory Visit: Payer: Self-pay | Admitting: Family Medicine

## 2016-10-01 ENCOUNTER — Telehealth: Payer: Self-pay

## 2016-10-01 NOTE — Telephone Encounter (Signed)
Pt is needing a refill on budesonide   Best number is [phone] or [phone]

## 2016-10-12 ENCOUNTER — Ambulatory Visit: Payer: Medicare PPO | Admitting: Family Medicine

## 2016-10-20 ENCOUNTER — Other Ambulatory Visit: Payer: Self-pay | Admitting: Family Medicine

## 2016-10-20 NOTE — Telephone Encounter (Signed)
Patient needs her budesonide (PULMICORT) 0.25 MG/2ML nebulizer solution refilled, her pharmacy put a call in for Dr Katrinka BlazingSmith to refill but it went to a different office instead of coming here.  [facility] [place]

## 2016-10-21 ENCOUNTER — Other Ambulatory Visit: Payer: Self-pay

## 2016-10-21 MED ORDER — BUDESONIDE 0.25 MG/2ML IN SUSP: RESPIRATORY_TRACT | 0 refills | Status: DC

## 2016-10-21 NOTE — Telephone Encounter (Signed)
Sent in RF and notified pt. 

## 2016-10-21 NOTE — Telephone Encounter (Signed)
Patient needs her budesonide (PULMICORT) 0.25 MG/2ML nebulizer solution refilled, her pharmacy put a call in for Dr Katrinka BlazingSmith to refill but it went to a different office instead of coming here   Wants Dr. Katrinka BlazingSmith to do the refill - [facility]

## 2016-10-21 NOTE — Telephone Encounter (Signed)
Sent in a 90 day supply 

## 2016-11-19 ENCOUNTER — Telehealth: Payer: Self-pay

## 2016-11-19 ENCOUNTER — Ambulatory Visit (INDEPENDENT_AMBULATORY_CARE_PROVIDER_SITE_OTHER): Payer: Medicare PPO

## 2016-11-19 DIAGNOSIS — Z23 Encounter for immunization: Secondary | ICD-10-CM

## 2016-11-19 NOTE — Telephone Encounter (Signed)
Pt  Dropped off a [employer] form to be completed please call once ready to pick up   Form was put in the nurse box at 102   Best number [phone]

## 2016-11-24 NOTE — Telephone Encounter (Signed)
Form completed, copy to scan and original up front for pick up. Pt. Advised.also faxed per pt. Request [phone]

## 2016-11-29 ENCOUNTER — Other Ambulatory Visit: Payer: Self-pay

## 2016-11-29 MED ORDER — HYDROCORTISONE 2.5 % EX OINT: TOPICAL_OINTMENT | Freq: Two times a day (BID) | CUTANEOUS | 0 refills | Status: AC

## 2016-11-29 NOTE — Telephone Encounter (Signed)
Fax [facility] [place] Hydrocortisone 2.5% ointment Sent to [facility]

## 2016-12-09 ENCOUNTER — Other Ambulatory Visit: Payer: Self-pay | Admitting: Family Medicine

## 2016-12-09 DIAGNOSIS — J449 Chronic obstructive pulmonary disease, unspecified: Secondary | ICD-10-CM

## 2016-12-17 ENCOUNTER — Other Ambulatory Visit: Payer: Self-pay | Admitting: Family Medicine

## 2017-01-12 ENCOUNTER — Other Ambulatory Visit: Payer: Self-pay

## 2017-01-12 NOTE — Telephone Encounter (Signed)
Pt is calling and needs a refill on her xopenex   Please advise  [phone]

## 2017-01-13 MED ORDER — LEVALBUTEROL HCL 1.25 MG/3ML IN NEBU: 1.2500 mg | INHALATION_SOLUTION | RESPIRATORY_TRACT | 0 refills | Status: DC | PRN

## 2017-01-17 ENCOUNTER — Other Ambulatory Visit: Payer: Self-pay | Admitting: Family Medicine

## 2017-01-17 DIAGNOSIS — J449 Chronic obstructive pulmonary disease, unspecified: Secondary | ICD-10-CM

## 2017-01-17 NOTE — Telephone Encounter (Signed)
Last office visit was 06/2016. Patient is due for follow up. Notified her by voice message.

## 2017-01-31 ENCOUNTER — Other Ambulatory Visit: Payer: Self-pay | Admitting: Family Medicine

## 2017-01-31 ENCOUNTER — Other Ambulatory Visit: Payer: Self-pay | Admitting: Internal Medicine

## 2017-01-31 NOTE — Telephone Encounter (Signed)
PATIENT ALSO NEEDS A REFILL ON PERFOROMIST 20 MCG/2ML NEBULIZER SOLUTION. HER PCP IS DR. Katrinka BlazingSMITH. BEST PHONE [phone] (CELL) PHARMACY CHOICE IS [facility] IN [place]. MBC

## 2017-02-01 ENCOUNTER — Other Ambulatory Visit: Payer: Self-pay | Admitting: Family Medicine

## 2017-02-01 NOTE — Telephone Encounter (Signed)
Pt also needs a refill on her budesonide (PULMICORT) 0.25 MG/2ML nebulizer solution.   Please advise:  [phone]

## 2017-02-02 NOTE — Telephone Encounter (Signed)
06/2016 last ov 

## 2017-02-02 NOTE — Telephone Encounter (Signed)
Please let me know if I ordered both of the medications correctly? Thanks!

## 2017-02-04 ENCOUNTER — Telehealth: Payer: Self-pay

## 2017-02-04 NOTE — Telephone Encounter (Signed)
[facility] [id number] Cover my meds

## 2017-02-07 NOTE — Telephone Encounter (Signed)
Per Cover my meds...final determination will be sent to us via fax within 24-72 hrs.

## 2017-02-07 NOTE — Telephone Encounter (Signed)
Form in your mailbox to fill out

## 2017-02-09 ENCOUNTER — Telehealth: Payer: Self-pay

## 2017-02-09 NOTE — Telephone Encounter (Addendum)
PATIENT WANTS DR. Katrinka BlazingSMITH TO KNOW THAT [employer] (HER INS. COMPANY) SENT HER A LETTER SAYING SHE NEEDS TO SWITCH TO A NEW MEDICINE THAT IS ON THEIR 2018 FORMULARY DRUG LIST. THEY WILL NOT COVER HER XOPENEX HFA 45 mcg INHALER. SHE WANTS DR. Katrinka BlazingSMITH TO KNOW THAT SHE WOULD LIKE TO KEEP USING THIS SAME INHALER THAT SHE HAS ALWAYS USED. SHE DOESN'T WANT TO TRY SOMETHING DIFFERENT. BEST PHONE [phone] (CELL) PHARMACY CHOICE IS [facility] IN [place].  MBC

## 2017-02-09 NOTE — Telephone Encounter (Signed)
See prior note

## 2017-02-10 NOTE — Telephone Encounter (Signed)
Denied  Appeal at [phone]

## 2017-02-15 ENCOUNTER — Telehealth: Payer: Self-pay

## 2017-02-15 NOTE — Telephone Encounter (Signed)
Pt is calling again says she spoke with [employer] and it needs to be reauthorized pt states we need to call [phone]

## 2017-02-15 NOTE — Telephone Encounter (Signed)
Patient is calling to check on her prescription for Performist 20mcg.  She states that [facility] and they state that we had started calling it in on the 20th but it was incomplete.  Please advise  Pt phone:[phone]

## 2017-02-15 NOTE — Telephone Encounter (Signed)
This medication has been denied.  FYI

## 2017-02-16 ENCOUNTER — Telehealth: Payer: Self-pay

## 2017-02-16 NOTE — Telephone Encounter (Signed)
See message 02/16/2017 regarding medication denial.

## 2017-02-16 NOTE — Telephone Encounter (Signed)
Received a call from patient inquiring about her PA.  I told patient it had been denied and I would send a message to the provider to see if they want to prescribe an alternate medication.

## 2017-02-21 ENCOUNTER — Other Ambulatory Visit: Payer: Self-pay | Admitting: Urgent Care

## 2017-02-22 ENCOUNTER — Ambulatory Visit: Payer: Medicare PPO | Admitting: Family Medicine

## 2017-02-24 MED ORDER — ALBUTEROL SULFATE HFA 108 (90 BASE) MCG/ACT IN AERS: 2.0000 | INHALATION_SPRAY | RESPIRATORY_TRACT | 11 refills | Status: DC | PRN

## 2017-02-24 MED ORDER — ALBUTEROL SULFATE (2.5 MG/3ML) 0.083% IN NEBU: 2.5000 mg | INHALATION_SOLUTION | RESPIRATORY_TRACT | 0 refills | Status: AC | PRN

## 2017-02-24 MED ORDER — ARFORMOTEROL TARTRATE 15 MCG/2ML IN NEBU: 15.0000 ug | INHALATION_SOLUTION | Freq: Two times a day (BID) | RESPIRATORY_TRACT | 11 refills | Status: DC

## 2017-02-24 MED ORDER — BUDESONIDE 0.25 MG/2ML IN SUSP: RESPIRATORY_TRACT | 0 refills | Status: DC

## 2017-02-24 NOTE — Telephone Encounter (Signed)
Albuterol HFA sent to pharmacy to replace Xopenex.  Please advise pt.

## 2017-02-24 NOTE — Telephone Encounter (Signed)
Prescribed Brovana to see if insurance will cover. Please advise pt that Rosalyn GessBrovana is a similar medication to Performist.

## 2017-02-24 NOTE — Telephone Encounter (Signed)
PT NEEDS REFILL OF BOTH NEB SOLUTIONS TOO

## 2017-02-28 ENCOUNTER — Telehealth: Payer: Self-pay | Admitting: Family Medicine

## 2017-02-28 ENCOUNTER — Other Ambulatory Visit: Payer: Self-pay | Admitting: Emergency Medicine

## 2017-02-28 MED ORDER — IPRATROPIUM BROMIDE 0.02 % IN SOLN
0.5000 mg | Freq: Four times a day (QID) | RESPIRATORY_TRACT | 0 refills | Status: DC | PRN
Start: 2017-02-28 — End: 2017-03-18

## 2017-02-28 NOTE — Telephone Encounter (Signed)
Atrovent refilled and e-scribed to pharmacy

## 2017-02-28 NOTE — Telephone Encounter (Signed)
Pt is needing to get a refill on atrovent the one that was called in she did not remember the name and said she has not used this before that is why she is asking for the atrovent refill Best number [phone] or [phone]

## 2017-03-09 ENCOUNTER — Telehealth: Payer: Self-pay | Admitting: Family Medicine

## 2017-03-09 NOTE — Telephone Encounter (Signed)
PT WILL CALL Anita Murphy BACK TO RESCHEDULE HER APPOINTMENT THAT SHE HAD WITH SMITH ON 03-29-17 FOR A CPE  SHE WILL NOT BE IN OFFICE THAT DAY

## 2017-03-13 ENCOUNTER — Telehealth: Payer: Self-pay

## 2017-03-13 NOTE — Telephone Encounter (Signed)
perforomist not covered needs [id number]

## 2017-03-14 NOTE — Telephone Encounter (Signed)
Denial due to patient needing an OV.  She is scheduling one today.  Call switched to scheduling.

## 2017-03-18 ENCOUNTER — Ambulatory Visit (INDEPENDENT_AMBULATORY_CARE_PROVIDER_SITE_OTHER): Payer: Medicare PPO | Admitting: Physician Assistant

## 2017-03-18 VITALS — BP 168/88 | HR 100 | Temp 98.6°F | Resp 16 | Ht 62.0 in | Wt 94.2 lb

## 2017-03-18 DIAGNOSIS — E440 Moderate protein-calorie malnutrition: Secondary | ICD-10-CM | POA: Diagnosis not present

## 2017-03-18 DIAGNOSIS — I10 Essential (primary) hypertension: Secondary | ICD-10-CM | POA: Diagnosis not present

## 2017-03-18 DIAGNOSIS — J449 Chronic obstructive pulmonary disease, unspecified: Secondary | ICD-10-CM

## 2017-03-18 MED ORDER — AMLODIPINE BESYLATE 5 MG PO TABS: 2.5000 mg | ORAL_TABLET | Freq: Every day | ORAL | 3 refills | Status: AC

## 2017-03-18 MED ORDER — POLYETHYLENE GLYCOL 3350 17 GM/SCOOP PO POWD: ORAL | 11 refills | Status: AC

## 2017-03-18 MED ORDER — MIRTAZAPINE 7.5 MG PO TABS: 7.5000 mg | ORAL_TABLET | Freq: Every day | ORAL | 3 refills | Status: AC

## 2017-03-18 MED ORDER — IPRATROPIUM BROMIDE 0.02 % IN SOLN
0.5000 mg | Freq: Four times a day (QID) | RESPIRATORY_TRACT | 3 refills | Status: AC | PRN
Start: 2017-03-18 — End: ?

## 2017-03-18 MED ORDER — FORMOTEROL FUMARATE 20 MCG/2ML IN NEBU
INHALATION_SOLUTION | RESPIRATORY_TRACT | 3 refills | Status: AC
Start: 2017-03-18 — End: ?

## 2017-03-18 MED ORDER — XOPENEX HFA 45 MCG/ACT IN AERO: INHALATION_SPRAY | RESPIRATORY_TRACT | 3 refills | Status: AC

## 2017-03-18 MED ORDER — ENSURE ENLIVE PO LIQD: 237.0000 mL | Freq: Two times a day (BID) | ORAL | 12 refills | Status: AC

## 2017-03-18 MED ORDER — LEVALBUTEROL HCL 1.25 MG/3ML IN NEBU: 1.2500 mg | INHALATION_SOLUTION | RESPIRATORY_TRACT | 3 refills | Status: AC | PRN

## 2017-03-18 MED ORDER — BUDESONIDE 0.25 MG/2ML IN SUSP: RESPIRATORY_TRACT | 2 refills | Status: AC

## 2017-03-18 NOTE — Patient Instructions (Addendum)
If the blood pressure is consistently higher than 140/90 after one week of starting 1/2 tab norvasc, then take the full tab of norvasc.     IF you received an x-ray today, you will receive an invoice from [facility]. Please contact [facility] at [phone] with questions or concerns regarding your invoice.   IF you received labwork today, you will receive an invoice from [facility]. Please contact [facility] at [phone] with questions or concerns regarding your invoice.   Our billing staff will not be able to assist you with questions regarding bills from these companies.  You will be contacted with the lab results as soon as they are available. The fastest way to get your results is to activate your My Chart account. Instructions are located on the last page of this paperwork. If you have not heard from [place] regarding the results in 2 weeks, please contact this office.

## 2017-03-18 NOTE — Progress Notes (Signed)
03/18/2017 2:53 PM   DOB: 08-29-1941 / MRN: [id number]  SUBJECTIVE:  Anita Murphy is a very pleasant 76 y.o. female presenting for refills of her pulmonary medications. Tells me she feels reasonably well today.    She has her medications with her today and has a nebulizer machine at home that is in good working order.  She does not miss doses of her nebs.  Tell me that she has been taking her atrovent every 4 hours and did not realize it was prescribed for every 6.    She has been prescribed bisoprolol in the past but is not taking this and is not sure why she is not taking.  Denies any problems with her heart.  Recent echo reveals EF of 60-65 percent and finding consistent with pulmonary HTN.   She would like her ensure refilled today as she is trying to maintain her weight.  She had run out of her mirtazapine and says that it has helped with her appetite in the past.   Immunization History  Administered Date(s) Administered  . Influenza Split 12/05/2012  . Influenza, Seasonal, Injecte, Preservative Fre 02/14/2013  . Influenza,inj,Quad PF,36+ Mos 08/31/2015, 11/19/2016  . Influenza-Unspecified 09/06/2010, 10/20/2011  . Pneumococcal Conjugate-13 07/22/2015  . Pneumococcal-Unspecified 10/20/2011  . Td 12/19/2002    She has No Known Allergies.   She  has a past medical history of Allergic rhinitis, cause unspecified; Chronic airway obstruction, not elsewhere classified; Elevated blood pressure reading without diagnosis of hypertension; Other (abnormal) findings on radiological examination of breast; Other diseases of lung, not elsewhere classified; and Personal history of tobacco use, presenting hazards to health.    She  reports that she quit smoking about 14 years ago. Her smoking use included Cigarettes. She has a 30.00 pack-year smoking history. She has never used smokeless tobacco. She reports that she does not drink alcohol or use drugs. She  reports that she does not engage in  sexual activity. The patient  has a past surgical history that includes Breast surgery (Left) and Tubal ligation (1969).  Her family history includes Aortic stenosis in her mother; COPD in her sister; Cancer in her father; Dementia in her mother; Heart disease in her father; Hypothyroidism in her mother; Lung disease in her sister; Mesothelioma in her father.  Review of Systems  Constitutional: Negative for chills, diaphoresis and fever.  Respiratory: Negative for cough, hemoptysis, sputum production, shortness of breath and wheezing.   Cardiovascular: Negative for chest pain, orthopnea and leg swelling.  Gastrointestinal: Negative for nausea.  Skin: Negative for rash.  Neurological: Negative for dizziness.    The problem list and medications were reviewed and updated by myself where necessary and exist elsewhere in the encounter.   OBJECTIVE:  BP (!) 168/88 (BP Location: Left Arm, Patient Position: Sitting, Cuff Size: Small)   Pulse 100   Temp 98.6 F (37 C) (Oral)   Resp 16   Ht '5\' 2"'  (1.575 m)   Wt 94 lb 3.2 oz (42.7 kg)   SpO2 92%   BMI 17.23 kg/m   Physical Exam  Constitutional:  Non-toxic appearance.  Cardiovascular: Regular rhythm, S1 normal, S2 normal, normal heart sounds and intact distal pulses.  Exam reveals no gallop, no friction rub and no decreased pulses.   No murmur heard. Pulmonary/Chest: Effort normal. No stridor. No respiratory distress. She has no wheezes. She has no rales.  Abdominal: She exhibits no distension.  Musculoskeletal: She exhibits no edema.  Skin: Skin is warm and dry.  She is not diaphoretic. No pallor.   ASSESSMENT AND PLAN:  [name] was seen today for medication refill.  Diagnoses and all orders for this visit:s  Essential hypertension: Stopping bisoprolol as she was not taking this anyway.  EF normal at 60-65% on her last ECHO.  Given competing mechanisms with beta blockade and multiple SABA and LABA will move her to Norvasc for control of  hypertension. She will start at 2.5 and if pressures greater than 140/90 she will move to the full tab daily.  If this remains uncontrolled she will come back to the office. -     amLODipine (NORVASC) 5 MG tablet; Take 0.5-1 tablets (2.5-5 mg total) by mouth daily. -     CMP14+EGFR  COPD III: On 2 liters O2 here and without SOB at rest. Her lungs are clear.  She is cachectic. I have refilled her medications as they are and have tried to educate her and her family on the mechanism of each medication.  Stopping albuterol neb as she is using levalbuterol nebs.  Stopping dual LABAs and will continue formoterol and budesonide. She has not been back to see Dr. Melvyn Novas and is requesting a referral today.  I have placed this.  -     budesonide (PULMICORT) 0.25 MG/2ML nebulizer solution; USE 1 VIAL VIA NEBULIZER TWICE DAILY -     ipratropium (ATROVENT) 0.02 % nebulizer solution; Take 2.5 mLs (0.5 mg total) by nebulization every 6 (six) hours as needed for wheezing or shortness of breath. -     levalbuterol (XOPENEX) 1.25 MG/3ML nebulizer solution; Take 1.25 mg by nebulization every 4 (four) hours as needed for wheezing. DX: J96.11; J44.9 -     XOPENEX HFA 45 MCG/ACT inhaler; INHALE 1 TO 2 PUFFS INTO THE LUNGS EVERY 4 HOURS AS NEEDED FOR WHEEZING -     formoterol (PERFOROMIST) 20 MCG/2ML nebulizer solution; USE 1 VIAL VIA NEBULIZER TWICE DAILY PERFECTLY REGULARLY -     Ambulatory referral to Pulmonology  Moderate protein-calorie malnutrition (HCC) -     mirtazapine (REMERON) 7.5 MG tablet; Take 1 tablet (7.5 mg total) by mouth at bedtime. -     feeding supplement, ENSURE ENLIVE, (ENSURE ENLIVE) LIQD; Take 237 mLs by mouth 2 (two) times daily between meals.  Other orders -     polyethylene glycol powder (GLYCOLAX/MIRALAX) powder; MIX 17GM UTD AND TK  PO DAILY    The patient is advised to call or return to clinic if she does not see an improvement in symptoms, or to seek the care of the closest emergency  department if she worsens with the above plan.   Philis Fendt, MHS, PA-C [facility] 03/18/2017 2:53 PM

## 2017-03-19 LAB — CMP14+EGFR
A/G RATIO: 1.6 (ref 1.2–2.2)
ALT: 12 IU/L (ref 0–32)
AST: 23 IU/L (ref 0–40)
Albumin: 4.3 g/dL (ref 3.5–4.8)
Alkaline Phosphatase: 49 IU/L (ref 39–117)
BUN / CREAT RATIO: 14 (ref 12–28)
BUN: 8 mg/dL (ref 8–27)
CHLORIDE: 96 mmol/L (ref 96–106)
CO2: 32 mmol/L — ABNORMAL HIGH (ref 18–29)
Calcium: 9.2 mg/dL (ref 8.7–10.3)
Creatinine, Ser: 0.56 mg/dL — ABNORMAL LOW (ref 0.57–1.00)
GFR calc non Af Amer: 93 mL/min/{1.73_m2} (ref >59)
GFR, EST AFRICAN AMERICAN: 107 mL/min/{1.73_m2} (ref >59)
GLOBULIN, TOTAL: 2.7 g/dL (ref 1.5–4.5)
Glucose: 124 mg/dL — ABNORMAL HIGH (ref 65–99)
Potassium: 4.5 mmol/L (ref 3.5–5.2)
SODIUM: 143 mmol/L (ref 134–144)
TOTAL PROTEIN: 7 g/dL (ref 6.0–8.5)

## 2017-03-29 ENCOUNTER — Encounter: Payer: Medicare PPO | Admitting: Family Medicine

## 2017-03-29 NOTE — Telephone Encounter (Signed)
Perforomist neb solution was denied by insurance. Please advise next step. Thank you.

## 2017-03-30 NOTE — Telephone Encounter (Signed)
Call to Medicare and representative told me the denial was processed under part D and that is where the denial came from.  She said that she would try it again under part B.  She said part B approved it for two years (02/09/2017-02/09/2019)  I notified Anita Murphy and tried to notify patient at both her numbers.  Neither number had a VM set up.  Could not leave a message, but I called the pharmacy and let them know of the approval.

## 2017-03-30 NOTE — Telephone Encounter (Signed)
Can I call for a prior auth? The list of what her insurance will pay for is getting shorter and shorter. Deliah Boston, MS, PA-C 9:40 AM, 03/30/2017

## 2017-04-18 ENCOUNTER — Encounter: Payer: Medicare PPO | Admitting: Family Medicine

## 2017-06-10 ENCOUNTER — Ambulatory Visit: Payer: Medicare PPO | Admitting: Family Medicine

## 2017-06-11 ENCOUNTER — Emergency Department (HOSPITAL_COMMUNITY): Payer: Medicare PPO

## 2017-06-11 ENCOUNTER — Inpatient Hospital Stay (HOSPITAL_COMMUNITY)
Admission: EM | Admit: 2017-06-11 | Discharge: 2017-06-19 | DRG: 189 | Disposition: A | Discharge status: Home / Self Care | Payer: Medicare PPO | Attending: Internal Medicine | Admitting: Internal Medicine

## 2017-06-11 ENCOUNTER — Encounter (HOSPITAL_COMMUNITY): Payer: Self-pay | Admitting: Emergency Medicine

## 2017-06-11 DIAGNOSIS — I2729 Other secondary pulmonary hypertension: Secondary | ICD-10-CM | POA: Diagnosis present

## 2017-06-11 DIAGNOSIS — Z87891 Personal history of nicotine dependence: Secondary | ICD-10-CM

## 2017-06-11 DIAGNOSIS — J9621 Acute and chronic respiratory failure with hypoxia: Principal | ICD-10-CM | POA: Diagnosis present

## 2017-06-11 DIAGNOSIS — Z66 Do not resuscitate: Secondary | ICD-10-CM | POA: Diagnosis present

## 2017-06-11 DIAGNOSIS — Z515 Encounter for palliative care: Secondary | ICD-10-CM

## 2017-06-11 DIAGNOSIS — I272 Pulmonary hypertension, unspecified: Secondary | ICD-10-CM | POA: Diagnosis present

## 2017-06-11 DIAGNOSIS — R64 Cachexia: Secondary | ICD-10-CM | POA: Diagnosis present

## 2017-06-11 DIAGNOSIS — I519 Heart disease, unspecified: Secondary | ICD-10-CM | POA: Diagnosis present

## 2017-06-11 DIAGNOSIS — Z79899 Other long term (current) drug therapy: Secondary | ICD-10-CM

## 2017-06-11 DIAGNOSIS — D638 Anemia in other chronic diseases classified elsewhere: Secondary | ICD-10-CM | POA: Diagnosis present

## 2017-06-11 DIAGNOSIS — J449 Chronic obstructive pulmonary disease, unspecified: Secondary | ICD-10-CM

## 2017-06-11 DIAGNOSIS — J9622 Acute and chronic respiratory failure with hypercapnia: Secondary | ICD-10-CM | POA: Diagnosis present

## 2017-06-11 DIAGNOSIS — I10 Essential (primary) hypertension: Secondary | ICD-10-CM | POA: Diagnosis present

## 2017-06-11 DIAGNOSIS — J441 Chronic obstructive pulmonary disease with (acute) exacerbation: Secondary | ICD-10-CM | POA: Diagnosis present

## 2017-06-11 DIAGNOSIS — I2781 Cor pulmonale (chronic): Secondary | ICD-10-CM | POA: Diagnosis present

## 2017-06-11 DIAGNOSIS — Z7951 Long term (current) use of inhaled steroids: Secondary | ICD-10-CM

## 2017-06-11 DIAGNOSIS — E43 Unspecified severe protein-calorie malnutrition: Secondary | ICD-10-CM | POA: Diagnosis present

## 2017-06-11 DIAGNOSIS — R54 Age-related physical debility: Secondary | ICD-10-CM | POA: Diagnosis present

## 2017-06-11 DIAGNOSIS — Z9981 Dependence on supplemental oxygen: Secondary | ICD-10-CM

## 2017-06-11 DIAGNOSIS — Z681 Body mass index (BMI) 19 or less, adult: Secondary | ICD-10-CM

## 2017-06-11 DIAGNOSIS — Z7982 Long term (current) use of aspirin: Secondary | ICD-10-CM

## 2017-06-11 LAB — BASIC METABOLIC PANEL
Anion gap: 12 (ref 5–15)
BUN: 6 mg/dL (ref 6–20)
CHLORIDE: 94 mmol/L — AB (ref 101–111)
CO2: 30 mmol/L (ref 22–32)
CREATININE: 0.59 mg/dL (ref 0.44–1.00)
Calcium: 9.4 mg/dL (ref 8.9–10.3)
GFR calc Af Amer: >60 mL/min (ref >60)
GLUCOSE: 152 mg/dL — AB (ref 65–99)
Potassium: 4.1 mmol/L (ref 3.5–5.1)
SODIUM: 136 mmol/L (ref 135–145)

## 2017-06-11 LAB — CBC WITH DIFFERENTIAL/PLATELET
Basophils Absolute: 0 10*3/uL (ref 0.0–0.1)
Basophils Relative: 0 %
Eosinophils Absolute: 0.1 10*3/uL (ref 0.0–0.7)
Eosinophils Relative: 1 %
HCT: 42.5 % (ref 36.0–46.0)
Hemoglobin: 12.7 g/dL (ref 12.0–15.0)
LYMPHS PCT: 20 %
Lymphs Abs: 1.7 10*3/uL (ref 0.7–4.0)
MCH: 26.8 pg (ref 26.0–34.0)
MCHC: 29.9 g/dL — AB (ref 30.0–36.0)
MCV: 89.9 fL (ref 78.0–100.0)
MONO ABS: 0.8 10*3/uL (ref 0.1–1.0)
MONOS PCT: 9 %
Neutro Abs: 5.7 10*3/uL (ref 1.7–7.7)
Neutrophils Relative %: 70 %
Platelets: 274 10*3/uL (ref 150–400)
RBC: 4.73 MIL/uL (ref 3.87–5.11)
RDW: 12.9 % (ref 11.5–15.5)
WBC: 8.3 10*3/uL (ref 4.0–10.5)

## 2017-06-11 LAB — GLUCOSE, CAPILLARY
GLUCOSE-CAPILLARY: 146 mg/dL — AB (ref 65–99)
Glucose-Capillary: 156 mg/dL — ABNORMAL HIGH (ref 65–99)

## 2017-06-11 LAB — MRSA PCR SCREENING: MRSA by PCR: NEGATIVE

## 2017-06-11 LAB — MAGNESIUM: Magnesium: 1.7 mg/dL (ref 1.7–2.4)

## 2017-06-11 LAB — PHOSPHORUS: PHOSPHORUS: 3.9 mg/dL (ref 2.5–4.6)

## 2017-06-11 MED ORDER — BUDESONIDE 0.25 MG/2ML IN SUSP
0.2500 mg | Freq: Two times a day (BID) | RESPIRATORY_TRACT | Status: DC
  Administered 2017-06-11 – 2017-06-16 (×10): 0.25 mg via RESPIRATORY_TRACT
  Filled 2017-06-11 (×11): qty 2

## 2017-06-11 MED ORDER — METHYLPREDNISOLONE SODIUM SUCC 125 MG IJ SOLR
125.0000 mg | Freq: Once | INTRAMUSCULAR | Status: AC
  Administered 2017-06-11: 125 mg via INTRAVENOUS
  Filled 2017-06-11: qty 2

## 2017-06-11 MED ORDER — ONDANSETRON HCL 4 MG PO TABS: 4.0000 mg | ORAL_TABLET | Freq: Four times a day (QID) | ORAL | Status: DC | PRN

## 2017-06-11 MED ORDER — HYDRALAZINE HCL 20 MG/ML IJ SOLN
10.0000 mg | Freq: Once | INTRAMUSCULAR | Status: DC
Start: 2017-06-11 — End: 2017-06-11

## 2017-06-11 MED ORDER — ENOXAPARIN SODIUM 40 MG/0.4ML ~~LOC~~ SOLN
40.0000 mg | SUBCUTANEOUS | Status: DC
  Administered 2017-06-11 – 2017-06-18 (×8): 40 mg via SUBCUTANEOUS
  Filled 2017-06-11 (×8): qty 0.4

## 2017-06-11 MED ORDER — AMLODIPINE BESYLATE 2.5 MG PO TABS
2.5000 mg | ORAL_TABLET | Freq: Every day | ORAL | Status: DC
  Administered 2017-06-12 – 2017-06-19 (×8): 2.5 mg via ORAL
  Filled 2017-06-11 (×8): qty 1

## 2017-06-11 MED ORDER — ASPIRIN 81 MG PO CHEW
81.0000 mg | CHEWABLE_TABLET | Freq: Every day | ORAL | Status: DC
  Administered 2017-06-12 – 2017-06-19 (×8): 81 mg via ORAL
  Filled 2017-06-11 (×9): qty 1

## 2017-06-11 MED ORDER — IPRATROPIUM-ALBUTEROL 0.5-2.5 (3) MG/3ML IN SOLN
3.0000 mL | Freq: Once | RESPIRATORY_TRACT | Status: AC
  Administered 2017-06-11: 3 mL via RESPIRATORY_TRACT
  Filled 2017-06-11: qty 3

## 2017-06-11 MED ORDER — ALBUTEROL (5 MG/ML) CONTINUOUS INHALATION SOLN
INHALATION_SOLUTION | RESPIRATORY_TRACT | Status: AC
  Filled 2017-06-11: qty 20

## 2017-06-11 MED ORDER — METHYLPREDNISOLONE SODIUM SUCC 125 MG IJ SOLR
60.0000 mg | Freq: Four times a day (QID) | INTRAMUSCULAR | Status: DC
  Administered 2017-06-11 – 2017-06-14 (×11): 60 mg via INTRAVENOUS
  Filled 2017-06-11 (×11): qty 2

## 2017-06-11 MED ORDER — ONDANSETRON HCL 4 MG/2ML IJ SOLN: 4.0000 mg | Freq: Four times a day (QID) | INTRAMUSCULAR | Status: DC | PRN

## 2017-06-11 MED ORDER — ALBUTEROL SULFATE (2.5 MG/3ML) 0.083% IN NEBU: 5.0000 mg | INHALATION_SOLUTION | Freq: Once | RESPIRATORY_TRACT | Status: DC

## 2017-06-11 MED ORDER — SODIUM CHLORIDE 0.9% FLUSH
3.0000 mL | Freq: Two times a day (BID) | INTRAVENOUS | Status: DC
  Administered 2017-06-11 – 2017-06-19 (×14): 3 mL via INTRAVENOUS

## 2017-06-11 MED ORDER — ALBUTEROL (5 MG/ML) CONTINUOUS INHALATION SOLN
10.0000 mg/h | INHALATION_SOLUTION | RESPIRATORY_TRACT | Status: DC
  Administered 2017-06-11: 10 mg/h via RESPIRATORY_TRACT

## 2017-06-11 MED ORDER — SODIUM CHLORIDE 0.9 % IV SOLN
INTRAVENOUS | Status: DC
  Administered 2017-06-11 – 2017-06-12 (×2): via INTRAVENOUS

## 2017-06-11 MED ORDER — ACETAMINOPHEN 325 MG PO TABS: 650.0000 mg | ORAL_TABLET | Freq: Four times a day (QID) | ORAL | Status: DC | PRN

## 2017-06-11 MED ORDER — LEVOFLOXACIN IN D5W 500 MG/100ML IV SOLN
500.0000 mg | INTRAVENOUS | Status: DC
  Administered 2017-06-11 – 2017-06-13 (×3): 500 mg via INTRAVENOUS
  Filled 2017-06-11 (×3): qty 100

## 2017-06-11 MED ORDER — ACETAMINOPHEN 650 MG RE SUPP: 650.0000 mg | Freq: Four times a day (QID) | RECTAL | Status: DC | PRN

## 2017-06-11 MED ORDER — IPRATROPIUM-ALBUTEROL 0.5-2.5 (3) MG/3ML IN SOLN
3.0000 mL | Freq: Four times a day (QID) | RESPIRATORY_TRACT | Status: DC
  Administered 2017-06-12 – 2017-06-17 (×23): 3 mL via RESPIRATORY_TRACT
  Filled 2017-06-11 (×26): qty 3

## 2017-06-11 MED ORDER — HYDRALAZINE HCL 20 MG/ML IJ SOLN: 10.0000 mg | Freq: Four times a day (QID) | INTRAMUSCULAR | Status: DC | PRN

## 2017-06-11 NOTE — H&P (Signed)
History and Physical    Anita Murphy ZOX:[id number] DOB: 1941/09/29 DOA: 06/11/2017   PCP: [name], MD [facility]  Patient coming from/Resides with: Private residence  Chief Complaint: Shortness of breath 2 days  HPI: Anita Murphy is a 76 y.o. female with medical history significant for hypertension, history of tobacco abuse and O2 dependent COPD. Patient reports 2 days of shortness of breath not associated with fevers, chills or congestion. Patient and family suspect environmental exposure to allergens (outdoor). In the ER patient's O2 requirement has increased from 2 to 3 L and she is maintaining O2 saturations between 98 and 100% but this is noted to be in the setting of increased work of breathing and tachypnea. She also has extremely poor air movement with respiratory effort. Chest x-ray was unremarkable. No leukocytosis.  ED Course:  Vital Signs: BP 124/72   Pulse 99   Temp 98.1 F (36.7 C)   Resp 18   Ht 5\' 2"  (1.575 m)   Wt 38.1 kg (84 lb)   SpO2 98%   BMI 15.36 kg/m  PCXR: No pneumonia; COPD and emphysema only with chronic bronchitic changes centrally Lab data: Sodium 136, potassium 4.1, chloride 94, CO2 30, glucose 152, BUN 6, creatinine 0.59, anion gap 12, white count 8300 with normal differential, hemoglobin 12.7, platelets 274,000 Medications and treatments: DuoNeb 1, Solu-Medrol 125 mg IV 1  Review of Systems:  In addition to the HPI above,  No Fever-chills, myalgias or other constitutional symptoms No Headache, changes with Vision or hearing, new weakness, tingling, numbness in any extremity, dizziness, dysarthria or word finding difficulty, gait disturbance or imbalance, tremors or seizure activity No problems swallowing food or Liquids, indigestion/reflux, choking or coughing while eating, abdominal pain with or after eating No Chest pain, Cough, palpitations No Abdominal pain, N/V, melena,hematochezia, dark tarry stools, constipation No dysuria,  malodorous urine, hematuria or flank pain No new skin rashes, lesions, masses or bruises, No new joint pains, aches, swelling or redness No recent unintentional weight gain or loss No polyuria, polydypsia or polyphagia   Past Medical History:  Diagnosis Date  . Allergic rhinitis, cause unspecified   . Chronic airway obstruction, not elsewhere classified   . Elevated blood pressure reading without diagnosis of hypertension   . Other (abnormal) findings on radiological examination of breast   . Other diseases of lung, not elsewhere classified   . Personal history of tobacco use, presenting hazards to health     Past Surgical History:  Procedure Laterality Date  . BREAST SURGERY Left    4-5 YEARS AGO  . TUBAL LIGATION  1969    Social History   Social History  . Marital status: Married    Spouse name: N/A  . Number of children: 4  . Years of education: 12   Occupational History  . 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 Smoking status: Former Smoker    Packs/day: 1.00    Years: 30.00    Types: Cigarettes    Quit date: 12/19/2002  . Smokeless tobacco: Never Used  . Alcohol use No  . Drug use: No  . Sexual activity: No   Other Topics Concern  . Not on file   Social History Narrative   Always uses seat belts. Smoke alarm and carbon monoxide detector in the home. Guns in the home stored in locked cabinet.       Marital status:  Widowed; married  x 52 yrs, happy, no abuse.  Husband with lung cancer and colon cancer.      Lives with one daughter, brother, and grandson.       Exercise: none       Caffeine use: Coffee 2 servings per day.      Employment: retired      ADLs: independent with all ADLs.  No assistant devices for ambulation.  Washes clothes, cooks.        Advanced Directives:  None; DNR/DNI in 2016.    Mobility: Independent Work history: Retired   No Known Allergies  Family History  Problem Relation Age of Onset  .  Lung disease Sister   . Hypothyroidism Mother   . Aortic stenosis Mother   . Dementia Mother   . COPD Sister        smoker  . Heart disease Father   . Mesothelioma Father        asbestos exp  . Cancer Father        LUNG    Prior to Admission medications   Medication Sig Start Date End Date Taking? Authorizing Provider  albuterol (PROVENTIL) (2.5 MG/3ML) 0.083% nebulizer solution Take 3 mLs (2.5 mg total) by nebulization every 4 (four) hours as needed for wheezing or shortness of breath. 02/24/17  Yes [name], MD  amLODipine (NORVASC) 5 MG tablet Take 0.5-1 tablets (2.5-5 mg total) by mouth daily. Patient taking differently: Take 2.5 mg by mouth daily.  03/18/17  Yes Ofilia Neas, PA-C  Ascorbic Acid (VITAMIN C) 1000 MG tablet Take 1,000 mg by mouth daily. Reported on 06/14/2016   Yes [provider]  aspirin 81 MG chewable tablet Chew 81 mg by mouth daily.   Yes [provider]  budesonide (PULMICORT) 0.25 MG/2ML nebulizer solution USE 1 VIAL VIA NEBULIZER TWICE DAILY 03/18/17  Yes Ofilia Neas, PA-C  feeding supplement, ENSURE ENLIVE, (ENSURE ENLIVE) LIQD Take 237 mLs by mouth 2 (two) times daily between meals. 03/18/17  Yes Ofilia Neas, PA-C  formoterol (PERFOROMIST) 20 MCG/2ML nebulizer solution USE 1 VIAL VIA NEBULIZER TWICE DAILY PERFECTLY REGULARLY 03/18/17  Yes Ofilia Neas, PA-C  hydrocortisone 2.5 % ointment Apply topically 2 (two) times daily. 11/29/16  Yes [name], MD  ipratropium (ATROVENT) 0.02 % nebulizer solution Take 2.5 mLs (0.5 mg total) by nebulization every 6 (six) hours as needed for wheezing or shortness of breath. 03/18/17  Yes Ofilia Neas, PA-C  ketoconazole (NIZORAL) 2 % cream Apply 1 application topically 2 (two) times daily. 06/14/16  Yes [name], MD  levalbuterol Pauline Aus) 1.25 MG/3ML nebulizer solution Take 1.25 mg by nebulization every 4 (four) hours as needed for wheezing. DX: J96.11; J44.9 03/18/17  Yes  Ofilia Neas, PA-C  polyethylene glycol powder (GLYCOLAX/MIRALAX) powder MIX 17GM UTD AND TK  PO DAILY 03/18/17  Yes Ofilia Neas, PA-C  XOPENEX HFA 45 MCG/ACT inhaler INHALE 1 TO 2 PUFFS INTO THE LUNGS EVERY 4 HOURS AS NEEDED FOR WHEEZING 03/18/17  Yes Ofilia Neas, PA-C  mirtazapine (REMERON) 7.5 MG tablet Take 1 tablet (7.5 mg total) by mouth at bedtime. Patient not taking: Reported on 06/11/2017 03/18/17   Ofilia Neas, PA-C    Physical Exam: Vitals:   06/11/17 1044 06/11/17 1048 06/11/17 1230  BP: (!) 163/65  124/72  Pulse: (!) 108  99  Resp: (!) 28  18  Temp: 98.1 F (36.7 C)    SpO2: 100%  98%  Weight:  38.1 kg (84 lb)   Height:  5\' 2"  (1.575 m)       Constitutional: Mild respiratory distress, somewhat anxious-appears quite underweight and cachectic Eyes: PERRL, lids and conjunctivae normal ENMT: Mucous membranes are moist. Posterior pharynx clear of any exudate or lesions. age-appropriate dentition Neck: normal, supple, no masses, no thyromegaly Respiratory: Lung sounds diminished throughout with faint sounding expiratory wheezes upper airways only, 3 L oxygen, increased work of breathing with use of accessory muscles and increased respiratory rate Cardiovascular: Regular rate and rhythm, no murmurs / rubs / gallops. No extremity edema. 2+ pedal pulses. No carotid bruits.  Abdomen: no tenderness, no masses palpated. No hepatosplenomegaly. Bowel sounds positive.  Musculoskeletal: no clubbing / cyanosis. No joint deformity upper and lower extremities. Good ROM, no contractures. Normal muscle tone.  Skin: no rashes, lesions, ulcers. No induration Neurologic: CN 2-12 grossly intact. Sensation intact, DTR normal. Strength 5/5 x all 4 extremities.  Psychiatric: Normal judgment and insight. Alert and oriented x 3. Anxious mood.    Labs on Admission: I have personally reviewed following labs and imaging studies  CBC:  Recent Labs Lab 06/11/17 1051  WBC 8.3    NEUTROABS 5.7  HGB 12.7  HCT 42.5  MCV 89.9  PLT 274   Basic Metabolic Panel:  Recent Labs Lab 06/11/17 1051  NA 136  K 4.1  CL 94*  CO2 30  GLUCOSE 152*  BUN 6  CREATININE 0.59  CALCIUM 9.4   GFR: Estimated Creatinine Clearance: 37.7 mL/min (by C-G formula based on SCr of 0.59 mg/dL). Liver Function Tests: No results for input(s): AST, ALT, ALKPHOS, BILITOT, PROT, ALBUMIN in the last 168 hours. No results for input(s): LIPASE, AMYLASE in the last 168 hours. No results for input(s): AMMONIA in the last 168 hours. Coagulation Profile: No results for input(s): INR, PROTIME in the last 168 hours. Cardiac Enzymes: No results for input(s): CKTOTAL, CKMB, CKMBINDEX, TROPONINI in the last 168 hours. BNP (last 3 results) No results for input(s): PROBNP in the last 8760 hours. HbA1C: No results for input(s): HGBA1C in the last 72 hours. CBG: No results for input(s): GLUCAP in the last 168 hours. Lipid Profile: No results for input(s): CHOL, HDL, LDLCALC, TRIG, CHOLHDL, LDLDIRECT in the last 72 hours. Thyroid Function Tests: No results for input(s): TSH, T4TOTAL, FREET4, T3FREE, THYROIDAB in the last 72 hours. Anemia Panel: No results for input(s): VITAMINB12, FOLATE, FERRITIN, TIBC, IRON, RETICCTPCT in the last 72 hours. Urine analysis:    Component Value Date/Time   BILIRUBINUR neg 12/05/2012 1522   PROTEINUR neg 12/05/2012 1522   UROBILINOGEN 0.2 12/05/2012 1522   NITRITE neg 12/05/2012 1522   LEUKOCYTESUR Negative 12/05/2012 1522   Sepsis Labs: @LABRCNTIP (procalcitonin:4,lacticidven:4) )No results found for this or any previous visit (from the past 240 hour(s)).   Radiological Exams on Admission: Dg Chest Port 1 View  Result Date: 06/11/2017 CLINICAL DATA:  Pt c/o SOB For 4-5 days. Pt denies CP at this time. Hx COPD, former smoker. EXAM: PORTABLE CHEST 1 VIEW COMPARISON:  Chest x-ray dated 01/18/2016. FINDINGS: Heart size and mediastinal contours are stable.  Atherosclerotic changes noted at the aortic arch. Lungs are hyperexpanded. No new airspace opacity to suggest a developing pneumonia. No pleural effusion or pneumothorax seen. No acute or suspicious osseous finding. IMPRESSION: 1. No active disease.  No evidence of pneumonia or pulmonary edema. 2. Hyperexpanded lungs indicating COPD/emphysema. Suspect associated chronic bronchitic changes centrally. 3. Aortic atherosclerosis. Electronically Signed   By: Weyman CroonStan  Linde Gillis M.D.   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 -Patient presents with abrupt onset of shortness of breath and increased work of breathing with clinical findings consistent with COPD exacerbation -Admit to SDU-high risk to transition to bilevel ventilation -Albuterol continuous neb 10 mg/hr now be followed by budesonide neb -Continue same Medrol 60 mg IV every 6 hours -DuoNeb scheduled every 6 hours -Continue supportive care with oxygen-currently on nasal cannula oxygen but due to increased work of breathing suspect may require BiPAP  Active Problems:   HTN (hypertension) -Currently controlled  -Continue Norvasc    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 -Echocardiogram from 2017: Normal sized ventricle so suspect these findings of cardiac remodeling related to severe pulmonary hypertension from lung disease -No evidence of heart failure at this time but can consider one-time dose of Lasix with underlying pulmonary hypertension to see if this will improve patient's respiratory symptoms (no edema on chest x-ray)    Protein-calorie malnutrition, 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 -On protein  supplementation prior to admission -Resume protein supplementation as well as oral diet once patient stable from respiratory standpoint      DVT prophylaxis: Lovenox Code Status: Full code although 2016 was a DO NOT RESUSCITATE/DO NOT INTUBATE  Family Communication: Multiple family members at bedside with patient's permission Disposition Plan: Home Consults called: None    ELLIS,ALLISON L. ANP-BC [facility] Pager [phone]   If 7PM-7AM, please contact night-coverage [email or web address] Password [facility]  06/11/2017, 2:47 PM

## 2017-06-11 NOTE — ED Notes (Signed)
Pt placed on bedpan. This RN called respiratory to come give pt continuous neb

## 2017-06-11 NOTE — ED Triage Notes (Signed)
Pt. Stated, Anita Murphy been  SOB for about 2 days. I have COPD.

## 2017-06-11 NOTE — ED Provider Notes (Signed)
[facility]-EMERGENCY DEPT Provider Note   CSN: [id number] Arrival date & time: 06/11/17  1041     History   Chief Complaint Chief Complaint  Patient presents with  . Shortness of Breath  . COPD    HPI Anita Murphy is a 76 y.o. female.   Patient brought in by family members. Patient with worsening shortness of breath for 2 days. But is struggling a little bit with her breathing for about a week. 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 No fevers. Patient arrived using accessory muscles and in some distress. Respiratory rate was 28. No fevers. Oxygen saturation though on her 2 L of oxygen that was upper 90s. Patient alert, able to follow commands, and cooperative.      Past Medical History:  Diagnosis Date  . Allergic rhinitis, cause unspecified   . Chronic airway obstruction, not elsewhere classified   . Elevated blood pressure reading without diagnosis of hypertension   . Other (abnormal) findings on radiological examination of breast   . Other diseases of lung, not elsewhere classified   . Personal history of tobacco use, presenting hazards to health     Patient Active Problem List   Diagnosis Date Noted  . Acute on chronic respiratory failure with hypoxia (HCC) 06/11/2017  . HTN (hypertension) 06/11/2017  . 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 Moderate to severe pulmonary hypertension (HCC) 06/11/2017  . Cor pulmonale (chronic) (HCC) 06/11/2017  . Left ventricular diastolic dysfunction 06/11/2017  . CAP (community acquired pneumonia) 01/11/2016  . Acute and chronic respiratory failure (acute-on-chronic) (HCC) 01/11/2016  . Chronic respiratory failure with hypoxia and hypercapnia (HCC) 09/02/2015  . COPD exacerbation (HCC) 07/22/2015  . Severe protein-calorie malnutrition (HCC) 07/22/2015  . Elevated blood pressure 07/22/2015  . Routine general medical examination at a health care facility 12/07/2012  . Need  for prophylactic vaccination and inoculation against influenza 12/07/2012  . Routine gynecological examination 12/07/2012  . COPD GOLD III criteria/ 02 dep  12/07/2012  . Weight loss 12/07/2012  . Stress reaction 12/07/2012  . Hordeolum 12/07/2012    Past Surgical History:  Procedure Laterality Date  . BREAST SURGERY Left    4-5 YEARS AGO  . TUBAL LIGATION  1969    OB History    No data available       Home Medications    Prior to Admission medications   Medication Sig Start Date End Date Taking? Authorizing Provider  albuterol (PROVENTIL) (2.5 MG/3ML) 0.083% nebulizer solution Take 3 mLs (2.5 mg total) by nebulization every 4 (four) hours as needed for wheezing or shortness of breath. 02/24/17  Yes [name], MD  amLODipine (NORVASC) 5 MG tablet Take 0.5-1 tablets (2.5-5 mg total) by mouth daily. Patient taking differently: Take 2.5 mg by mouth daily.  03/18/17  Yes Ofilia Neas, PA-C  Ascorbic Acid (VITAMIN C) 1000 MG tablet Take 1,000 mg by mouth daily. Reported on 06/14/2016   Yes [provider]  aspirin 81 MG chewable tablet Chew 81 mg by mouth daily.   Yes [provider]  budesonide (PULMICORT) 0.25 MG/2ML nebulizer solution USE 1 VIAL VIA NEBULIZER TWICE DAILY 03/18/17  Yes Ofilia Neas, PA-C  feeding supplement, ENSURE ENLIVE, (ENSURE ENLIVE) LIQD Take 237 mLs by mouth 2 (two) times daily between meals. 03/18/17  Yes Deliah Boston L, PA-C  formoterol (PERFOROMIST) 20 MCG/2ML nebulizer solution USE 1 VIAL VIA NEBULIZER TWICE DAILY PERFECTLY REGULARLY 03/18/17  Yes Ofilia Neas, PA-C  hydrocortisone 2.5 % ointment Apply topically 2 (two) times daily. 11/29/16  Yes [name], MD  ipratropium (ATROVENT) 0.02 % nebulizer solution Take 2.5 mLs (0.5 mg total) by nebulization every 6 (six) hours as needed for wheezing or shortness of breath. 03/18/17  Yes Ofilia Neas, PA-C  ketoconazole (NIZORAL) 2 % cream Apply 1 application topically 2  (two) times daily. 06/14/16  Yes [name], MD  levalbuterol Pauline Aus) 1.25 MG/3ML nebulizer solution Take 1.25 mg by nebulization every 4 (four) hours as needed for wheezing. DX: J96.11; J44.9 03/18/17  Yes Ofilia Neas, PA-C  polyethylene glycol powder (GLYCOLAX/MIRALAX) powder MIX 17GM UTD AND TK  PO DAILY 03/18/17  Yes Ofilia Neas, PA-C  XOPENEX HFA 45 MCG/ACT inhaler INHALE 1 TO 2 PUFFS INTO THE LUNGS EVERY 4 HOURS AS NEEDED FOR WHEEZING 03/18/17  Yes Ofilia Neas, PA-C  mirtazapine (REMERON) 7.5 MG tablet Take 1 tablet (7.5 mg total) by mouth at bedtime. Patient not taking: Reported on 06/11/2017 03/18/17   Ofilia Neas, PA-C    Family History Family History  Problem Relation Age of Onset  . Lung disease Sister   . Hypothyroidism Mother   . Aortic stenosis Mother   . Dementia Mother   . COPD Sister        smoker  . Heart disease Father   . Mesothelioma Father        asbestos exp  . Cancer Father        LUNG    Social History Social History  Substance Use Topics  . Smoking status: Former Smoker    Packs/day: 1.00    Years: 30.00    Types: Cigarettes    Quit date: 12/19/2002  . Smokeless tobacco: Never Used  . Alcohol use No     Allergies   Patient has no known allergies.   Review of Systems Review of Systems  Constitutional: Negative for fever.  HENT: Negative for congestion.   Eyes: Negative for visual disturbance.  Respiratory: Positive for cough and shortness of breath.   Cardiovascular: Negative for chest pain.  Gastrointestinal: Negative for abdominal pain.  Genitourinary: Negative for dysuria.  Musculoskeletal: Negative for back pain.  Skin: Negative for rash.  Neurological: Negative for syncope.  Hematological: Does not bruise/bleed easily.     Physical Exam Updated Vital Signs BP 108/60   Pulse 90   Temp 98.1 F (36.7 C)   Resp (!) 24   Ht 1.575 m (5\' 2" )   Wt 38.1 kg (84 lb)   SpO2 95%   BMI 15.36 kg/m   Physical Exam    Constitutional: She is oriented to person, place, and time. She appears well-developed and well-nourished. She appears distressed.  HENT:  Head: Normocephalic and atraumatic.  Eyes: Conjunctivae and EOM are normal. Pupils are equal, round, and reactive to light.  Neck: Normal range of motion. Neck supple.  Cardiovascular: Normal rate and regular rhythm.   Pulmonary/Chest: She is in respiratory distress. She has wheezes.  Abdominal: Soft. Bowel sounds are normal. There is no tenderness.  Musculoskeletal: Normal range of motion. She exhibits no edema.  Neurological: She is alert and oriented to person, place, and time. No cranial nerve deficit or sensory deficit. She exhibits normal muscle tone. Coordination normal.  Skin: Skin is warm.  Nursing note and vitals reviewed.    ED Treatments / Results  Labs (all labs ordered are listed, but only abnormal results are displayed)  Labs Reviewed  CBC WITH DIFFERENTIAL/PLATELET - Abnormal; Notable for the following:       Result Value   MCHC 29.9 (*)    All other components within normal limits  BASIC METABOLIC PANEL - Abnormal; Notable for the following:    Chloride 94 (*)    Glucose, Bld 152 (*)    All other components within normal limits    EKG  EKG Interpretation None       Radiology Dg Chest Port 1 View  Result Date: 06/11/2017 CLINICAL DATA:  Pt c/o SOB For 4-5 days. Pt denies CP at this time. Hx COPD, former smoker. EXAM: PORTABLE CHEST 1 VIEW COMPARISON:  Chest x-ray dated 01/18/2016. FINDINGS: Heart size and mediastinal contours are stable. Atherosclerotic changes noted at the aortic arch. Lungs are hyperexpanded. No new airspace opacity to suggest a developing pneumonia. No pleural effusion or pneumothorax seen. No acute or suspicious osseous finding. IMPRESSION: 1. No active disease.  No evidence of pneumonia or pulmonary edema. 2. Hyperexpanded lungs indicating COPD/emphysema. Suspect associated chronic bronchitic changes  centrally. 3. Aortic atherosclerosis. Electronically Signed   By: Bary RichardStan  Maynard M.D.   On: 06/11/2017 12:06    Procedures Procedures (including critical care time)  Medications Ordered in ED Medications  0.9 %  sodium chloride infusion ( Intravenous New Bag/Given 06/11/17 1300)  ipratropium-albuterol (DUONEB) 0.5-2.5 (3) MG/3ML nebulizer solution 3 mL (3 mLs Nebulization Not Given 06/11/17 1500)  methylPREDNISolone sodium succinate (SOLU-MEDROL) 125 mg/2 mL injection 60 mg (not administered)  ipratropium-albuterol (DUONEB) 0.5-2.5 (3) MG/3ML nebulizer solution 3 mL (not administered)  budesonide (PULMICORT) nebulizer solution 0.25 mg (not administered)  albuterol (PROVENTIL,VENTOLIN) solution continuous neb (10 mg/hr Nebulization New Bag/Given 06/11/17 1459)  albuterol (PROVENTIL, VENTOLIN) (5 MG/ML) 0.5% continuous inhalation solution (  Not Given 06/11/17 1500)  levofloxacin (LEVAQUIN) IVPB 500 mg (not administered)  ipratropium-albuterol (DUONEB) 0.5-2.5 (3) MG/3ML nebulizer solution 3 mL (3 mLs Nebulization Given 06/11/17 1300)  methylPREDNISolone sodium succinate (SOLU-MEDROL) 125 mg/2 mL injection 125 mg (125 mg Intravenous Given 06/11/17 1300)     Initial Impression / Assessment and Plan / ED Course  I have reviewed the triage vital signs and the nursing notes.  Pertinent labs & imaging results that were available during my care of the patient were reviewed by me and considered in my medical decision making (see chart for details).     Patient with exacerbation of COPD. Patient with increased breathing problems for about a week but got worse lately. Using sensory muscles. Patient received nebulized treatments here 2 still with working hard to breathe. Patient initially had no wheezing but if the first breathing treatment had some wheezing. Patient will receive also received site Medrol. Patient's oxygen saturations have been good. Patient is normally on 2 L of oxygen. She's been satting  in the upper 90s. Chest x-ray negative for any pneumonia pneumothorax or pulmonary edema.   Since patient still working hard to breathe although non-toxic discussed with hospitalist and they will admit. Patient is followed by pulmonary medicine.  Final Clinical Impressions(s) / ED Diagnoses   Final diagnoses:  COPD exacerbation (HCC)    New Prescriptions New Prescriptions   No medications on file     Vanetta MuldersZackowski, Nakayla Rorabaugh, MD 06/11/17 1549

## 2017-06-11 NOTE — ED Notes (Addendum)
Anita SilkAllison Murphy at bedside with plan to place pt on BIPAP due to work of breathing with the continuous neb. Respiratory informed.

## 2017-06-12 ENCOUNTER — Ambulatory Visit: Payer: Medicare PPO | Admitting: Physician Assistant

## 2017-06-12 DIAGNOSIS — Z87891 Personal history of nicotine dependence: Secondary | ICD-10-CM | POA: Diagnosis not present

## 2017-06-12 DIAGNOSIS — Z515 Encounter for palliative care: Secondary | ICD-10-CM | POA: Diagnosis not present

## 2017-06-12 DIAGNOSIS — Z79899 Other long term (current) drug therapy: Secondary | ICD-10-CM | POA: Diagnosis not present

## 2017-06-12 DIAGNOSIS — I2781 Cor pulmonale (chronic): Secondary | ICD-10-CM

## 2017-06-12 DIAGNOSIS — Z681 Body mass index (BMI) 19 or less, adult: Secondary | ICD-10-CM | POA: Diagnosis not present

## 2017-06-12 DIAGNOSIS — J9622 Acute and chronic respiratory failure with hypercapnia: Secondary | ICD-10-CM | POA: Diagnosis present

## 2017-06-12 DIAGNOSIS — J9621 Acute and chronic respiratory failure with hypoxia: Secondary | ICD-10-CM | POA: Diagnosis present

## 2017-06-12 DIAGNOSIS — I10 Essential (primary) hypertension: Secondary | ICD-10-CM

## 2017-06-12 DIAGNOSIS — Z66 Do not resuscitate: Secondary | ICD-10-CM | POA: Diagnosis present

## 2017-06-12 DIAGNOSIS — I519 Heart disease, unspecified: Secondary | ICD-10-CM

## 2017-06-12 DIAGNOSIS — Z7951 Long term (current) use of inhaled steroids: Secondary | ICD-10-CM | POA: Diagnosis not present

## 2017-06-12 DIAGNOSIS — D638 Anemia in other chronic diseases classified elsewhere: Secondary | ICD-10-CM | POA: Diagnosis present

## 2017-06-12 DIAGNOSIS — I272 Pulmonary hypertension, unspecified: Secondary | ICD-10-CM

## 2017-06-12 DIAGNOSIS — I2729 Other secondary pulmonary hypertension: Secondary | ICD-10-CM | POA: Diagnosis present

## 2017-06-12 DIAGNOSIS — Z7189 Other specified counseling: Secondary | ICD-10-CM | POA: Diagnosis not present

## 2017-06-12 DIAGNOSIS — R64 Cachexia: Secondary | ICD-10-CM | POA: Diagnosis present

## 2017-06-12 DIAGNOSIS — E43 Unspecified severe protein-calorie malnutrition: Secondary | ICD-10-CM | POA: Diagnosis present

## 2017-06-12 DIAGNOSIS — Z9981 Dependence on supplemental oxygen: Secondary | ICD-10-CM | POA: Diagnosis not present

## 2017-06-12 DIAGNOSIS — R54 Age-related physical debility: Secondary | ICD-10-CM | POA: Diagnosis present

## 2017-06-12 DIAGNOSIS — J441 Chronic obstructive pulmonary disease with (acute) exacerbation: Secondary | ICD-10-CM | POA: Diagnosis present

## 2017-06-12 DIAGNOSIS — Z7982 Long term (current) use of aspirin: Secondary | ICD-10-CM | POA: Diagnosis not present

## 2017-06-12 LAB — GLUCOSE, CAPILLARY
Glucose-Capillary: 131 mg/dL — ABNORMAL HIGH (ref 65–99)
Glucose-Capillary: 129 mg/dL — ABNORMAL HIGH (ref 65–99)

## 2017-06-12 LAB — COMPREHENSIVE METABOLIC PANEL
ALBUMIN: 3.8 g/dL (ref 3.5–5.0)
ALT: 19 U/L (ref 14–54)
AST: 26 U/L (ref 15–41)
Alkaline Phosphatase: 34 U/L — ABNORMAL LOW (ref 38–126)
Anion gap: 9 (ref 5–15)
BUN: 9 mg/dL (ref 6–20)
CHLORIDE: 98 mmol/L — AB (ref 101–111)
CO2: 33 mmol/L — AB (ref 22–32)
CREATININE: 0.59 mg/dL (ref 0.44–1.00)
Calcium: 9.2 mg/dL (ref 8.9–10.3)
GFR calc Af Amer: >60 mL/min (ref >60)
GFR calc non Af Amer: >60 mL/min (ref >60)
GLUCOSE: 152 mg/dL — AB (ref 65–99)
Potassium: 4.3 mmol/L (ref 3.5–5.1)
SODIUM: 140 mmol/L (ref 135–145)
Total Bilirubin: 0.5 mg/dL (ref 0.3–1.2)
Total Protein: 6.3 g/dL — ABNORMAL LOW (ref 6.5–8.1)

## 2017-06-12 LAB — CBC
HCT: 40 % (ref 36.0–46.0)
HEMOGLOBIN: 11.9 g/dL — AB (ref 12.0–15.0)
MCH: 26.5 pg (ref 26.0–34.0)
MCHC: 29.8 g/dL — AB (ref 30.0–36.0)
MCV: 89.1 fL (ref 78.0–100.0)
PLATELETS: 218 10*3/uL (ref 150–400)
RBC: 4.49 MIL/uL (ref 3.87–5.11)
RDW: 12.9 % (ref 11.5–15.5)
WBC: 4.5 10*3/uL (ref 4.0–10.5)

## 2017-06-12 MED ORDER — ARFORMOTEROL TARTRATE 15 MCG/2ML IN NEBU
15.0000 ug | INHALATION_SOLUTION | Freq: Two times a day (BID) | RESPIRATORY_TRACT | Status: DC
  Administered 2017-06-12 – 2017-06-19 (×15): 15 ug via RESPIRATORY_TRACT
  Filled 2017-06-12 (×15): qty 2

## 2017-06-12 NOTE — Progress Notes (Signed)
[facility]                                                                              Patient Demographics  Anita Murphy, is a 76 y.o. female, DOB - 1941/10/25, WJX:[id number]  Admit date - 06/11/2017   Admitting Physician Haydee Salter, MD  Outpatient Primary MD for the patient is [name], MD  Outpatient specialists:   LOS - 0  days   Medical records reviewed and are as summarized below:    Chief Complaint  Patient presents with  . Shortness of Breath  . COPD       Brief summary   Patient is a 76 year old female with hypertension, tobacco abuse, O2 dependent COPD, severe pulmonary hypertension, presented with shortness of breath for the last 2 days, no fevers chills. In ED, patient was noticed to be tachypneic, increased work of breathing with increased extremely poor air movement. She was placed on BiPAP. Chest x-ray showed COPD with emphysema, no pneumonia. Patient was admitted to stepdown unit.   Assessment & Plan    Principal Problem:   Acute on chronic respiratory failure with hypoxia (HCC)In the setting of end-stage COPD, COPD exacerbation, severe pulmonary hypertension, cor pulmonale - Presented with acute shortness of breath, tachypnea and increased work of breathing, placed on BiPAP, admitted to stepdown, needed continuous albuterol nebs - Currently improving at the time of my encounter, will DC BiPAP and monitor closely to see if she can tolerate off BiPAP - Continue Solu-Medrol IV, scheduled nebs, Brovana, pulmicort, Levaquin - Flutter valve  Active Problems:    HTN (hypertension) - Currently stable, continue Norvasc    Protein-calorie malnutrition, 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 -Nutrition consult - Continue protein supplementation      Moderate to severe pulmonary hypertension (HCC), cor pulmonale, left ventricular diastolic dysfunction - 2-D echo 2017 showed EF of 60-65% with grade 1 diastolic dysfunction, severe  pulmonary hypertension, PA pressure 82   Code Status: full  DVT Prophylaxis:  Lovenox  Family Communication: Discussed in detail with the patient, all imaging results, lab results explained to the patient, patient's 2 sisters at the bedside   Disposition Plan: Monitored closely in stepdown today, if tolerates off BiPAP for next 24 hours, will transfer out   Time Spent in minutes 35 minutes  Procedures:  BiPAP  Consultants :   None  Antimicrobials :   Levaquin 6/24 >   Medications  Scheduled Meds: . amLODipine  2.5 mg Oral Daily  . arformoterol  15 mcg Nebulization BID  . aspirin  81 mg Oral Daily  . budesonide  0.25 mg Nebulization BID  . enoxaparin (LOVENOX) injection  40 mg Subcutaneous Q24H  . ipratropium-albuterol  3 mL Nebulization Q6H  . methylPREDNISolone (SOLU-MEDROL) injection  60 mg Intravenous Q6H  . sodium chloride flush  3 mL Intravenous Q12H   Continuous Infusions: . sodium chloride 50 mL/hr at 06/11/17 1300  . albuterol 10 mg/hr (06/11/17 1459)  . levofloxacin (LEVAQUIN) IV Stopped (06/11/17 1736)   PRN Meds:.acetaminophen **OR** acetaminophen, ondansetron **OR** ondansetron (ZOFRAN) IV   Antibiotics   Anti-infectives    Start  Dose/Rate Route Frequency Ordered Stop   06/11/17 1615  levofloxacin (LEVAQUIN) IVPB 500 mg     500 mg 100 mL/hr over 60 Minutes Intravenous Every 24 hours 06/11/17 1542 06/21/17 1614        Subjective:   Anita Murphy was seen and examined today.On BiPAP, feels better, sats 100%. Family at the bedside. Shortness of breath is improving, no fevers.  Patient denies dizziness, chest pain, abdominal pain, N/V/D/C, new weakness, numbess, tingling.   Objective:   Vitals:   06/12/17 0733 06/12/17 0823 06/12/17 0824 06/12/17 0825  BP:      Pulse: 91     Resp:      Temp: 98.3 F (36.8 C)     TempSrc: Axillary     SpO2: 100% 100% 100% 100%  Weight:      Height:        Intake/Output Summary (Last 24 hours) at  06/12/17 0907 Last data filed at 06/12/17 0600  Gross per 24 hour  Intake              950 ml  Output              100 ml  Net              850 ml     Wt Readings from Last 3 Encounters:  06/12/17 49.6 kg (109 lb 4.8 oz)  03/18/17 42.7 kg (94 lb 3.2 oz)  06/24/16 42.7 kg (94 lb 3.2 oz)     Exam  General: Alert and oriented x 3, NAD, On BiPAP   Eyes: PERRLA, EOMI, Anicteric Sclera,  HEENT:  Atraumatic, normocephalic, normal oropharynx  Cardiovascular: S1 S2 auscultated, no rubs, murmurs or gallops. Regular rate and rhythm.  Respiratory: Decreased breath sounds throughout but no wheezing   Gastrointestinal: Soft, nontender, nondistended, + bowel sounds  Ext: no pedal edema bilaterally  Neuro: AAOx3, Cr N's II- XII. Strength 5/5 upper and lower extremities bilaterally, speech clear, sensations grossly intact  Musculoskeletal: No digital cyanosis, clubbing  Skin: No rashes  Psych: Normal affect and demeanor, alert and oriented x3    Data Reviewed:  I have personally reviewed following labs and imaging studies  Micro Results Recent Results (from the past 240 hour(s))  MRSA PCR Screening     Status: None   Collection Time: 06/11/17  5:14 PM  Result Value Ref Range Status   MRSA by PCR NEGATIVE NEGATIVE Final    Comment:        The GeneXpert MRSA Assay (FDA approved for NASAL specimens only), is one component of a comprehensive MRSA colonization surveillance program. It is not intended to diagnose MRSA infection nor to guide or monitor treatment for MRSA infections.     Radiology Reports Dg Chest Port 1 View  Result Date: 06/11/2017 CLINICAL DATA:  Pt c/o SOB For 4-5 days. Pt denies CP at this time. Hx COPD, former smoker. EXAM: PORTABLE CHEST 1 VIEW COMPARISON:  Chest x-ray dated 01/18/2016. FINDINGS: Heart size and mediastinal contours are stable. Atherosclerotic changes noted at the aortic arch. Lungs are hyperexpanded. No new airspace opacity to suggest a  developing pneumonia. No pleural effusion or pneumothorax seen. No acute or suspicious osseous finding. IMPRESSION: 1. No active disease.  No evidence of pneumonia or pulmonary edema. 2. Hyperexpanded lungs indicating COPD/emphysema. Suspect associated chronic bronchitic changes centrally. 3. Aortic atherosclerosis. Electronically Signed   By: Bary RichardStan  Maynard M.D.   On: 06/11/2017 12:06    Lab Data:  CBC:  Recent Labs Lab 06/11/17 1051 06/12/17 0156  WBC 8.3 4.5  NEUTROABS 5.7  --   HGB 12.7 11.9*  HCT 42.5 40.0  MCV 89.9 89.1  PLT 274 218   Basic Metabolic Panel:  Recent Labs Lab 06/11/17 1051 06/11/17 2132 06/12/17 0156  NA 136  --  140  K 4.1  --  4.3  CL 94*  --  98*  CO2 30  --  33*  GLUCOSE 152*  --  152*  BUN 6  --  9  CREATININE 0.59  --  0.59  CALCIUM 9.4  --  9.2  MG  --  1.7  --   PHOS  --  3.9  --    GFR: Estimated Creatinine Clearance: 49 mL/min (by C-G formula based on SCr of 0.59 mg/dL). Liver Function Tests:  Recent Labs Lab 06/12/17 0156  AST 26  ALT 19  ALKPHOS 34*  BILITOT 0.5  PROT 6.3*  ALBUMIN 3.8   No results for input(s): LIPASE, AMYLASE in the last 168 hours. No results for input(s): AMMONIA in the last 168 hours. Coagulation Profile: No results for input(s): INR, PROTIME in the last 168 hours. Cardiac Enzymes: No results for input(s): CKTOTAL, CKMB, CKMBINDEX, TROPONINI in the last 168 hours. BNP (last 3 results) No results for input(s): PROBNP in the last 8760 hours. HbA1C: No results for input(s): HGBA1C in the last 72 hours. CBG:  Recent Labs Lab 06/11/17 2030 06/11/17 2331 06/12/17 0321 06/12/17 0731  GLUCAP 156* 146* 131* 129*   Lipid Profile: No results for input(s): CHOL, HDL, LDLCALC, TRIG, CHOLHDL, LDLDIRECT in the last 72 hours. Thyroid Function Tests: No results for input(s): TSH, T4TOTAL, FREET4, T3FREE, THYROIDAB in the last 72 hours. Anemia Panel: No results for input(s): VITAMINB12, FOLATE, FERRITIN,  TIBC, IRON, RETICCTPCT in the last 72 hours. Urine analysis:    Component Value Date/Time   BILIRUBINUR neg 12/05/2012 1522   PROTEINUR neg 12/05/2012 1522   UROBILINOGEN 0.2 12/05/2012 1522   NITRITE neg 12/05/2012 1522   LEUKOCYTESUR Negative 12/05/2012 1522     Nakshatra Klose M.D. [facility] 06/12/2017, 9:07 AM  Pager: [phone] Between 7am to 7pm - call Pager - [phone]  After 7pm go to [email or web address] - password [email or web address]  Call night coverage person covering after 7pm

## 2017-06-12 NOTE — Progress Notes (Signed)
Pt taken off BIPAP and placed on 3L nasal cannula.  Tolerating well at this time.  RT will continue to monitor.

## 2017-06-13 DIAGNOSIS — J9621 Acute and chronic respiratory failure with hypoxia: Principal | ICD-10-CM

## 2017-06-13 DIAGNOSIS — J441 Chronic obstructive pulmonary disease with (acute) exacerbation: Secondary | ICD-10-CM

## 2017-06-13 DIAGNOSIS — I2729 Other secondary pulmonary hypertension: Secondary | ICD-10-CM

## 2017-06-13 DIAGNOSIS — E43 Unspecified severe protein-calorie malnutrition: Secondary | ICD-10-CM

## 2017-06-13 LAB — BASIC METABOLIC PANEL
ANION GAP: 5 (ref 5–15)
BUN: 13 mg/dL (ref 6–20)
CO2: 32 mmol/L (ref 22–32)
Calcium: 8.8 mg/dL — ABNORMAL LOW (ref 8.9–10.3)
Chloride: 100 mmol/L — ABNORMAL LOW (ref 101–111)
Creatinine, Ser: 0.45 mg/dL (ref 0.44–1.00)
GFR calc Af Amer: >60 mL/min (ref >60)
GFR calc non Af Amer: >60 mL/min (ref >60)
Glucose, Bld: 134 mg/dL — ABNORMAL HIGH (ref 65–99)
POTASSIUM: 4.6 mmol/L (ref 3.5–5.1)
SODIUM: 137 mmol/L (ref 135–145)

## 2017-06-13 LAB — CBC
HCT: 34.3 % — ABNORMAL LOW (ref 36.0–46.0)
HEMOGLOBIN: 10.2 g/dL — AB (ref 12.0–15.0)
MCH: 26.4 pg (ref 26.0–34.0)
MCHC: 29.7 g/dL — ABNORMAL LOW (ref 30.0–36.0)
MCV: 88.9 fL (ref 78.0–100.0)
PLATELETS: 202 10*3/uL (ref 150–400)
RBC: 3.86 MIL/uL — AB (ref 3.87–5.11)
RDW: 13.1 % (ref 11.5–15.5)
WBC: 10.2 10*3/uL (ref 4.0–10.5)

## 2017-06-13 MED ORDER — MORPHINE SULFATE (PF) 4 MG/ML IV SOLN
1.0000 mg | Freq: Once | INTRAVENOUS | Status: AC
  Administered 2017-06-13: 1 mg via INTRAVENOUS
  Filled 2017-06-13: qty 1

## 2017-06-13 MED ORDER — ENSURE ENLIVE PO LIQD
237.0000 mL | Freq: Three times a day (TID) | ORAL | Status: DC
  Administered 2017-06-13 – 2017-06-19 (×15): 237 mL via ORAL

## 2017-06-13 MED ORDER — IPRATROPIUM-ALBUTEROL 0.5-2.5 (3) MG/3ML IN SOLN
3.0000 mL | RESPIRATORY_TRACT | Status: DC | PRN
  Administered 2017-06-19: 3 mL via RESPIRATORY_TRACT

## 2017-06-13 MED ORDER — MIRTAZAPINE 15 MG PO TABS
7.5000 mg | ORAL_TABLET | Freq: Every day | ORAL | Status: DC
  Administered 2017-06-13 – 2017-06-18 (×6): 7.5 mg via ORAL
  Filled 2017-06-13 (×6): qty 1

## 2017-06-13 MED ORDER — LORAZEPAM 1 MG PO TABS
1.0000 mg | ORAL_TABLET | Freq: Once | ORAL | Status: AC
  Administered 2017-06-13: 1 mg via ORAL
  Filled 2017-06-13: qty 1

## 2017-06-13 MED ORDER — ALBUTEROL SULFATE (2.5 MG/3ML) 0.083% IN NEBU
INHALATION_SOLUTION | RESPIRATORY_TRACT | Status: AC
  Administered 2017-06-13: 2.5 mg
  Filled 2017-06-13: qty 3

## 2017-06-13 NOTE — Progress Notes (Addendum)
Initial Nutrition Assessment  DOCUMENTATION CODES:   Severe malnutrition in context of chronic illness  INTERVENTION:    Chocolate Ensure Enlive po TID, each supplement provides 350 kcal and 20 grams of protein  NUTRITION DIAGNOSIS:   Malnutrition (severe) related to chronic illness (end-stage COPD) as evidenced by severe depletion of body fat, severe depletion of muscle mass  GOAL:   Patient will meet greater than or equal to 90% of their needs  MONITOR:   PO intake, Supplement acceptance, Labs, Weight trends, Skin, I & O's  REASON FOR ASSESSMENT:   Consult Assessment of nutrition requirement/status  ASSESSMENT:   "76 y.o. Female with PMH significant for HTN, history of tobacco abuse and O2 dependent COPD. Patient reports 2 days of shortness of breath not associated with fevers, chills or congestion. In ER patient's O2 requirement has increased from 2 to 3 L and she is maintaining O2 saturations between 98 and 100% but this is noted to be in the setting of increased work of breathing and tachypnea. She also has extremely poor air movement with respiratory effort. Chest x-ray was unremarkable." Copied from Internal Medicine note, Russella DarEllis, Allison L, NP, 06/11/17.   RD spoke with pt and pt's daughter at bedside. Pt states her appetite is ok. No % PO intake per flowsheets. RN brought pt chocolate Ensure Enlive during visit today. Pt enjoyed.  Per readings below, pt's weight has been stable. Medications reviewed and include Remeron.  Labs reviewed. CBG's [id number].  Nutrition-Focused physical exam completed. Findings are severe fat depletion, severe muscle depletion, and no edema.   Diet Order:  Diet Heart Room service appropriate? Yes; Fluid consistency: Thin  Skin:  Reviewed, no issues  Last BM:  6/25  Height:   Ht Readings from Last 1 Encounters:  06/11/17 5\' 2"  (1.575 m)   Weight:   Wt Readings from Last 1 Encounters:  06/13/17 107 lb 6.4 oz (48.7 kg)   Wt  Readings from Last 10 Encounters:  06/13/17 107 lb 6.4 oz (48.7 kg)  03/18/17 94 lb 3.2 oz (42.7 kg)  06/24/16 94 lb 3.2 oz (42.7 kg)  06/14/16 95 lb 9.6 oz (43.4 kg)  01/18/16 95 lb 12.8 oz (43.5 kg)  01/18/16 96 lb 6.4 oz (43.7 kg)  01/13/16 91 lb 0.8 oz (41.3 kg)  01/11/16 85 lb (38.6 kg)  11/02/15 91 lb 6.4 oz (41.5 kg)  10/16/15 90 lb (40.8 kg)   Ideal Body Weight:  50 kg  BMI:  Body mass index is 19.64 kg/m.  Estimated Nutritional Needs:   Kcal:  1400-1600  Protein:  70-80 gm  Fluid:  >/= 1.5 L  EDUCATION NEEDS:   No education needs identified at this time  Maureen ChattersKatie Oney Folz, RD, LDN Pager #: [phone] After-Hours Pager #: [phone]

## 2017-06-13 NOTE — Progress Notes (Addendum)
[facility]                                                                              Patient Demographics  Anita Murphy, is a 76 y.o. female, DOB - January 12, 1941, ZOX:[id number]  Admit date - 06/11/2017   Admitting Physician Haydee Salter, MD  Outpatient Primary MD for the patient is [name], MD  Outpatient specialists:   LOS - 1  days   Medical records reviewed and are as summarized below:    Chief Complaint  Patient presents with  . Shortness of Breath  . COPD       Brief summary   Patient is a 76 year old female with hypertension, tobacco abuse, O2 dependent COPD, severe pulmonary hypertension, presented with shortness of breath for the last 2 days, no fevers chills. In ED, patient was noticed to be tachypneic, increased work of breathing with increased extremely poor air movement. She was placed on BiPAP. Chest x-ray showed COPD with emphysema, no pneumonia. Patient was admitted to stepdown unit.  Currently improving, off BiPAP, on 2 L, and transferred out of the stepdown   Assessment & Plan    Principal Problem:   Acute on chronic respiratory failure with hypoxia (HCC)In the setting of end-stage COPD, COPD exacerbation, severe pulmonary hypertension, cor pulmonale - Presented with acute shortness of breath, tachypnea and increased work of breathing, placed on BiPAP, admitted to stepdown, needed continuous albuterol nebs -BiPAP was discontinued yesterday, has not required BiPAP overnight - Continue Solu-Medrol IV, scheduled nebs, Brovana, pulmicort, Levaquin - Flutter valve - Discussed with patient and daughter at the bedside regarding severe pulmonary hypertension and need to follow up with pulmonology outpatient consistently. Patient has not seen Dr. Shona Simpson since January 2017.  - will consult pulmonology, discussed with Dr. 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 Restarted Remeron, Megace or Marinol may help the appetite     Moderate to severe pulmonary hypertension (HCC), cor pulmonale, left ventricular diastolic dysfunction - 2-D echo 2017 showed EF of 60-65% with grade 1 diastolic dysfunction, severe pulmonary hypertension, PA pressure 82 - Discussed in detail with patient and daughter at the bedside   Code Status: full  DVT Prophylaxis:  Lovenox  Family Communication: Discussed in detail with the patient, all imaging results, lab results explained to the patient, patient's daughter at the bedside   Disposition Plan:   Time Spent in minutes 25 minutes  Procedures:  BiPAP  Consultants :   Pulmonology  Antimicrobials :   Levaquin 6/24 >   Medications  Scheduled Meds: . amLODipine  2.5 mg Oral Daily  . arformoterol  15 mcg Nebulization BID  . aspirin  81 mg Oral Daily  . budesonide  0.25 mg Nebulization BID  . enoxaparin (LOVENOX) injection  40 mg Subcutaneous Q24H  . ipratropium-albuterol  3 mL Nebulization Q6H  . LORazepam  1 mg Oral Once  . methylPREDNISolone (SOLU-MEDROL) injection  60 mg Intravenous Q6H  . sodium  chloride flush  3 mL Intravenous Q12H   Continuous Infusions: . albuterol 10 mg/hr (06/11/17 1459)  . levofloxacin (LEVAQUIN) IV Stopped (06/12/17 1624)   PRN Meds:.acetaminophen **OR** acetaminophen, ondansetron **OR** ondansetron (ZOFRAN) IV   Antibiotics   Anti-infectives    Start     Dose/Rate Route Frequency Ordered Stop   06/11/17 1615  levofloxacin (LEVAQUIN) IVPB 500 mg     500 mg 100 mL/hr over 60 Minutes Intravenous Every 24 hours 06/11/17 1542 06/21/17 1614        Subjective:   Anita Murphy was seen and examined today. Did not need BiPAP overnight, O2 sats 100% on 2 L. Daughter at the bedside. Still having shortness of breath with pursed lip  breathing and wheezing. Improving from admission. No fevers or chills.  Patient denies dizziness, chest pain, abdominal pain, N/V/D/C, new weakness, numbess, tingling.   Objective:   Vitals:   06/13/17 0402 06/13/17 0536 06/13/17 0714 06/13/17 0757  BP: 132/78   117/68  Pulse: (!) 139 80 82 (!) 102  Resp: (!) 27 17 19  (!) 23  Temp: 98.2 F (36.8 C)   97.7 F (36.5 C)  TempSrc: Oral   Oral  SpO2: 99% 99% 99% 100%  Weight:      Height:        Intake/Output Summary (Last 24 hours) at 06/13/17 0956 Last data filed at 06/13/17 0740  Gross per 24 hour  Intake            772.5 ml  Output              850 ml  Net            -77.5 ml     Wt Readings from Last 3 Encounters:  06/13/17 48.7 kg (107 lb 6.4 oz)  03/18/17 42.7 kg (94 lb 3.2 oz)  06/24/16 42.7 kg (94 lb 3.2 oz)     Exam  General: Alert and oriented x 3, NAD, Cachectic, pursed lip breathing  Eyes: PERRLA, EOMI  HEENT:  atraumatic normocephalic  Cardiovascular: S1 and S2 clear, regular rate and rhythm  Respiratory: Decreased breath sounds throughout with bilateral wheezing  Gastrointestinal: Soft, NT, ND, normal bowel sounds  Ext: no pedal edema bilaterally  Neuro: no new deficits  Musculoskeletal: No cyanosis or clubbing  Skin: No rashes  Psych: somewhat anxious, alert and oriented 3   Data Reviewed:  I have personally reviewed following labs and imaging studies  Micro Results Recent Results (from the past 240 hour(s))  MRSA PCR Screening     Status: None   Collection Time: 06/11/17  5:14 PM  Result Value Ref Range Status   MRSA by PCR NEGATIVE NEGATIVE Final    Comment:        The GeneXpert MRSA Assay (FDA approved for NASAL specimens only), is one component of a comprehensive MRSA colonization surveillance program. It is not intended to diagnose MRSA infection nor to guide or monitor treatment for MRSA infections.     Radiology Reports Dg Chest Port 1 View  Result Date:  06/11/2017 CLINICAL DATA:  Pt c/o SOB For 4-5 days. Pt denies CP at this time. Hx COPD, former smoker. EXAM: PORTABLE CHEST 1 VIEW COMPARISON:  Chest x-ray dated 01/18/2016. FINDINGS: Heart size and mediastinal contours are stable. Atherosclerotic changes noted at the aortic arch. Lungs are hyperexpanded. No new airspace opacity to suggest a developing pneumonia. No pleural effusion or pneumothorax seen. No acute or suspicious osseous finding. IMPRESSION: 1. No active  disease.  No evidence of pneumonia or pulmonary edema. 2. Hyperexpanded lungs indicating COPD/emphysema. Suspect associated chronic bronchitic changes centrally. 3. Aortic atherosclerosis. Electronically Signed   By: Bary Richard M.D.   On: 06/11/2017 12:06    Lab Data:  CBC:  Recent Labs Lab 06/11/17 1051 06/12/17 0156 06/13/17 0322  WBC 8.3 4.5 10.2  NEUTROABS 5.7  --   --   HGB 12.7 11.9* 10.2*  HCT 42.5 40.0 34.3*  MCV 89.9 89.1 88.9  PLT 274 218 202   Basic Metabolic Panel:  Recent Labs Lab 06/11/17 1051 06/11/17 2132 06/12/17 0156 06/13/17 0322  NA 136  --  140 137  K 4.1  --  4.3 4.6  CL 94*  --  98* 100*  CO2 30  --  33* 32  GLUCOSE 152*  --  152* 134*  BUN 6  --  9 13  CREATININE 0.59  --  0.59 0.45  CALCIUM 9.4  --  9.2 8.8*  MG  --  1.7  --   --   PHOS  --  3.9  --   --    GFR: Estimated Creatinine Clearance: 48.2 mL/min (by C-G formula based on SCr of 0.45 mg/dL). Liver Function Tests:  Recent Labs Lab 06/12/17 0156  AST 26  ALT 19  ALKPHOS 34*  BILITOT 0.5  PROT 6.3*  ALBUMIN 3.8   No results for input(s): LIPASE, AMYLASE in the last 168 hours. No results for input(s): AMMONIA in the last 168 hours. Coagulation Profile: No results for input(s): INR, PROTIME in the last 168 hours. Cardiac Enzymes: No results for input(s): CKTOTAL, CKMB, CKMBINDEX, TROPONINI in the last 168 hours. BNP (last 3 results) No results for input(s): PROBNP in the last 8760 hours. HbA1C: No results for  input(s): HGBA1C in the last 72 hours. CBG:  Recent Labs Lab 06/11/17 2030 06/11/17 2331 06/12/17 0321 06/12/17 0731  GLUCAP 156* 146* 131* 129*   Lipid Profile: No results for input(s): CHOL, HDL, LDLCALC, TRIG, CHOLHDL, LDLDIRECT in the last 72 hours. Thyroid Function Tests: No results for input(s): TSH, T4TOTAL, FREET4, T3FREE, THYROIDAB in the last 72 hours. Anemia Panel: No results for input(s): VITAMINB12, FOLATE, FERRITIN, TIBC, IRON, RETICCTPCT in the last 72 hours. Urine analysis:    Component Value Date/Time   BILIRUBINUR neg 12/05/2012 1522   PROTEINUR neg 12/05/2012 1522   UROBILINOGEN 0.2 12/05/2012 1522   NITRITE neg 12/05/2012 1522   LEUKOCYTESUR Negative 12/05/2012 1522     Ashlon Lottman M.D. [facility] 06/13/2017, 9:56 AM  Pager: [phone] Between 7am to 7pm - call Pager - [phone]  After 7pm go to [email or web address] - password [email or web address]  Call night coverage person covering after 7pm

## 2017-06-13 NOTE — Consult Note (Signed)
Name: Anita Murphy MRN: [id number] DOB: [birth year]    ADMISSION DATE:  06/11/2017 CONSULTATION DATE:  06/13/2017  REFERRING MD :  Isidoro Donningai, [facility]  CHIEF COMPLAINT:  Respiratory distress  HISTORY OF PRESENT ILLNESS:  76 year old ex-smoker with severe emphysema and chronic respiratory failure maintained on 2 L oxygen admitted with 2 days of shortness of breath on 6/24. She denied cough or sputum production or fevers or chills or sick contacts. She was noted to have increased work of breathing and BiPAP was used transiently with good improvement. She was also treated as a COPD exacerbation with IV steroids and bronchodilators with mild improvement. We're consulted to assist with management and also comment on finding of pulmonary hypertension. She has seen my partner Dr. Sherene Sireswert last in 12/2015 and has been lost to follow-up since. She has been maintained on a regimen of Pulmicort and Perforomist twice daily nebs with Atrovent and Xopenex for breakthrough. She quit smoking in 2004 and is reportedly compliant with her medications. Her activity is limited to walking around the house with oxygen. She is compliant with 24-hour oxygen. She has lost significant weight in the past few years and was as low as 80 pounds but now has gained back to her current weight of 106 pounds. She was placed on Megace by her PCP. She denies pulmonary edema or abdominal swelling   Significant tests/ events reviewed   Echo 12/2015 shows D-shaped septum with severe pulmonary hypertension PFT's  08/21/2013   FEV1  0.52 ( 33%) ratio 42 and no change B2 CT chest 11/2016 shows severe bullous emphysema       PAST MEDICAL HISTORY :   has a past medical history of Allergic rhinitis, cause unspecified; Chronic airway obstruction, not elsewhere classified; Elevated blood pressure reading without diagnosis of hypertension; Other (abnormal) findings on radiological examination of breast; Other diseases of lung, not elsewhere classified;  and Personal history of tobacco use, presenting hazards to health.  has a past surgical history that includes Tubal ligation (1969) and Breast surgery (Left).    Prior to Admission medications   Medication Sig Start Date End Date Taking? Authorizing Provider  albuterol (PROVENTIL) (2.5 MG/3ML) 0.083% nebulizer solution Take 3 mLs (2.5 mg total) by nebulization every 4 (four) hours as needed for wheezing or shortness of breath. 02/24/17  Yes Ethelda ChickSmith, Kristi M, MD  amLODipine (NORVASC) 5 MG tablet Take 0.5-1 tablets (2.5-5 mg total) by mouth daily. Patient taking differently: Take 2.5 mg by mouth daily.  03/18/17  Yes Ofilia Neaslark, Michael L, PA-C  Ascorbic Acid (VITAMIN C) 1000 MG tablet Take 1,000 mg by mouth daily. Reported on 06/14/2016   Yes [provider]  aspirin 81 MG chewable tablet Chew 81 mg by mouth daily.   Yes [provider]  budesonide (PULMICORT) 0.25 MG/2ML nebulizer solution USE 1 VIAL VIA NEBULIZER TWICE DAILY 03/18/17  Yes Ofilia Neaslark, Michael L, PA-C  feeding supplement, ENSURE ENLIVE, (ENSURE ENLIVE) LIQD Take 237 mLs by mouth 2 (two) times daily between meals. 03/18/17  Yes Ofilia Neaslark, Michael L, PA-C  formoterol (PERFOROMIST) 20 MCG/2ML nebulizer solution USE 1 VIAL VIA NEBULIZER TWICE DAILY PERFECTLY REGULARLY 03/18/17  Yes Ofilia Neaslark, Michael L, PA-C  hydrocortisone 2.5 % ointment Apply topically 2 (two) times daily. 11/29/16  Yes Ethelda ChickSmith, Kristi M, MD  ipratropium (ATROVENT) 0.02 % nebulizer solution Take 2.5 mLs (0.5 mg total) by nebulization every 6 (six) hours as needed for wheezing or shortness of breath. 03/18/17  Yes Ofilia Neaslark, Michael L, PA-C  ketoconazole (NIZORAL)  2 % cream Apply 1 application topically 2 (two) times daily. 06/14/16  Yes [name], MD  levalbuterol Pauline Aus) 1.25 MG/3ML nebulizer solution Take 1.25 mg by nebulization every 4 (four) hours as needed for wheezing. DX: J96.11; J44.9 03/18/17  Yes Ofilia Neas, PA-C  polyethylene glycol powder (GLYCOLAX/MIRALAX)  powder MIX 17GM UTD AND TK  PO DAILY 03/18/17  Yes Ofilia Neas, PA-C  XOPENEX HFA 45 MCG/ACT inhaler INHALE 1 TO 2 PUFFS INTO THE LUNGS EVERY 4 HOURS AS NEEDED FOR WHEEZING 03/18/17  Yes Ofilia Neas, PA-C  mirtazapine (REMERON) 7.5 MG tablet Take 1 tablet (7.5 mg total) by mouth at bedtime. Patient not taking: Reported on 06/11/2017 03/18/17   Ofilia Neas, PA-C   No Known Allergies  FAMILY HISTORY:  family history includes Aortic stenosis in her mother; COPD in her sister; Cancer in her father; Dementia in her mother; Heart disease in her father; Hypothyroidism in her mother; Lung disease in her sister; Mesothelioma in her father. SOCIAL HISTORY:  reports that she quit smoking about 14 years ago. Her smoking use included Cigarettes. She has a 30.00 pack-year smoking history. She has never used smokeless tobacco. She reports that she does not drink alcohol or use drugs.  REVIEW OF SYSTEMS:   Positive for shortness of breath and dry cough and weight loss  Constitutional: Negative for fever, chills,  malaise/fatigue and diaphoresis.  HENT: Negative for hearing loss, ear pain, nosebleeds, congestion, sore throat, neck pain, tinnitus and ear discharge.   Eyes: Negative for blurred vision, double vision, photophobia, pain, discharge and redness.  Respiratory: Negative for cough, hemoptysis, sputum production and stridor.   Cardiovascular: Negative for chest pain, palpitations, orthopnea, claudication, leg swelling and PND.  Gastrointestinal: Negative for heartburn, nausea, vomiting, abdominal pain, diarrhea, constipation, blood in stool and melena.  Genitourinary: Negative for dysuria, urgency, frequency, hematuria and flank pain.  Musculoskeletal: Negative for myalgias, back pain, joint pain and falls.  Skin: Negative for itching and rash.  Neurological: Negative for dizziness, tingling, tremors, sensory change, speech change, focal weakness, seizures, loss of consciousness, weakness  and headaches.  Endo/Heme/Allergies: Negative for environmental allergies and polydipsia. Does not bruise/bleed easily.  SUBJECTIVE:   VITAL SIGNS: Temp:  [97.4 F (36.3 C)-98.3 F (36.8 C)] 97.4 F (36.3 C) (06/26 1208) Pulse Rate:  [77-139] 122 (06/26 1232) Resp:  [17-27] 27 (06/26 1232) BP: (97-132)/(55-97) 124/86 (06/26 1232) SpO2:  [91 %-100 %] 91 % (06/26 1232) Weight:  [107 lb 6.4 oz (48.7 kg)] 107 lb 6.4 oz (48.7 kg) (06/26 0359)  PHYSICAL EXAMINATION: Gen. Pleasant, cachectic, in no distress, normal affect ENT - no pallor, icterus, no post nasal drip Neck: No JVD, no thyromegaly, no carotid bruits Lungs:  use of accessory muscles+, no dullness to percussion, decreased BL without rales or rhonchi  Cardiovascular: Rhythm regular,tachy, heart sounds  normal, no murmurs or gallops, no peripheral edema Abdomen: soft and non-tender, no hepatosplenomegaly, BS normal. Musculoskeletal: No deformities, no cyanosis or clubbing Neuro:  alert, non focal, no asterexis    Recent Labs Lab 06/11/17 1051 06/12/17 0156 06/13/17 0322  NA 136 140 137  K 4.1 4.3 4.6  CL 94* 98* 100*  CO2 30 33* 32  BUN 6 9 13   CREATININE 0.59 0.59 0.45  GLUCOSE 152* 152* 134*    Recent Labs Lab 06/11/17 1051 06/12/17 0156 06/13/17 0322  HGB 12.7 11.9* 10.2*  HCT 42.5 40.0 34.3*  WBC 8.3 4.5 10.2  PLT 274 218 202  No results found.  ASSESSMENT / PLAN:  Acute on chronic respiratory failure with hypoxia and hypercarbia Acute exacerbation of COPD  -continue IV Solu-Medrol 60 every 8  -Continue long-acting Perforomist on discharge with Pulmicort nebs twice daily -Short-acting Xopenex and Atrovent for breakthrough as needed -Levaquin for 5-7 days -There is some data to use noninvasive ventilation to target hypercarbia after discharge from acute exacerbation, we will trial nocturnal BiPAP 12/5 over the next 2 nights to see if she tolerates and then decide whether she would be a candidate  for this  Severe pulmonary hypertension- WHO class 3 -No benefit of vasodilators in the situation, continue oxygen 24 hours  I discussed guarded prognosis for end-stage emphysema with patient and her 2 daughters. Please ensure follow-up with Dr. [name] on discharge  Cyril Mourning MD. [facility] Pager [phone] If no response call [phone]    06/13/2017, 1:14 PM

## 2017-06-13 NOTE — Progress Notes (Signed)
Pt's daughter c/o her mom not being able to breathe right - Rt here pt given extra breathing treatment and call made to DR Rai. Pt RR up to 32 , figeting 02 sats dropping increased FI 02 to 3 l n/c Pt alert - Dr Isidoro Donningai called back after  treatment ordered and given 1 mg of Morphine. Pt much calmer and breathing easier. 02 sat 100 % on 3 L n/c St 102 resting quietly - daughter at bedside

## 2017-06-13 NOTE — Progress Notes (Signed)
PT was very SOB  Cabinet over ride treatment was given

## 2017-06-13 NOTE — Care Management Note (Signed)
Case Management Note  Patient Details  Name: Idell PicklesDelores Kanner MRN: [id number] Date of Birth: November 10, 1941  Subjective/Objective:   COPD exac, Pulm HTN                 Action/Plan: Discharge Planning: NCM spoke to dtr, Talbert ForestShirley and [place]ravie at bedside. Pt lives in home with adult dtr's [place] and [place]. Pt has a neb machine and home oxygen with [employer]. [facility] the [facility] [phone] and fax [phone]. Will need HH RN orders with [facility] (if determined HH needed). Made family aware to bring portable at time of dc.   PCP Ethelda ChickSMITH, KRISTI M    Expected Discharge Date:                 Expected Discharge Plan:  Home w Home Health Services  In-House Referral:  NA  Discharge planning Services  CM Consult  Post Acute Care Choice:  Home Health Choice offered to:  Adult Children  DME Arranged:  N/A DME Agency:  NA  HH Arranged:  RN HH Agency:  Other - See comment  Status of Service:  In process, will continue to follow  If discussed at Long Length of Stay Meetings, dates discussed:    Additional Comments:  Elliot CousinShavis, Clifford Benninger Ellen, RN 06/13/2017, 3:27 PM

## 2017-06-14 ENCOUNTER — Inpatient Hospital Stay (HOSPITAL_COMMUNITY): Payer: Medicare PPO

## 2017-06-14 LAB — BASIC METABOLIC PANEL
Anion gap: 4 — ABNORMAL LOW (ref 5–15)
BUN: 15 mg/dL (ref 6–20)
CHLORIDE: 97 mmol/L — AB (ref 101–111)
CO2: 38 mmol/L — AB (ref 22–32)
Calcium: 8.8 mg/dL — ABNORMAL LOW (ref 8.9–10.3)
Creatinine, Ser: 0.46 mg/dL (ref 0.44–1.00)
GFR calc Af Amer: >60 mL/min (ref >60)
GFR calc non Af Amer: >60 mL/min (ref >60)
GLUCOSE: 118 mg/dL — AB (ref 65–99)
POTASSIUM: 4.5 mmol/L (ref 3.5–5.1)
Sodium: 139 mmol/L (ref 135–145)

## 2017-06-14 LAB — CBC
HCT: 35.8 % — ABNORMAL LOW (ref 36.0–46.0)
HEMOGLOBIN: 10.2 g/dL — AB (ref 12.0–15.0)
MCH: 25.8 pg — AB (ref 26.0–34.0)
MCHC: 28.5 g/dL — ABNORMAL LOW (ref 30.0–36.0)
MCV: 90.4 fL (ref 78.0–100.0)
Platelets: 197 10*3/uL (ref 150–400)
RBC: 3.96 MIL/uL (ref 3.87–5.11)
RDW: 13.1 % (ref 11.5–15.5)
WBC: 8.4 10*3/uL (ref 4.0–10.5)

## 2017-06-14 LAB — GLUCOSE, CAPILLARY: Glucose-Capillary: 95 mg/dL (ref 65–99)

## 2017-06-14 MED ORDER — HYDROCOD POLST-CPM POLST ER 10-8 MG/5ML PO SUER
5.0000 mL | Freq: Two times a day (BID) | ORAL | Status: DC
  Administered 2017-06-14 – 2017-06-19 (×11): 5 mL via ORAL
  Filled 2017-06-14 (×11): qty 5

## 2017-06-14 MED ORDER — POLYETHYLENE GLYCOL 3350 17 G PO PACK
17.0000 g | PACK | Freq: Every day | ORAL | Status: DC | PRN
  Filled 2017-06-14: qty 1

## 2017-06-14 MED ORDER — METHYLPREDNISOLONE SODIUM SUCC 125 MG IJ SOLR
60.0000 mg | Freq: Two times a day (BID) | INTRAMUSCULAR | Status: DC
  Administered 2017-06-14 – 2017-06-16 (×4): 60 mg via INTRAVENOUS
  Filled 2017-06-14 (×4): qty 2

## 2017-06-14 MED ORDER — LEVOFLOXACIN 500 MG PO TABS
500.0000 mg | ORAL_TABLET | Freq: Every day | ORAL | Status: DC
  Administered 2017-06-14 – 2017-06-16 (×3): 500 mg via ORAL
  Filled 2017-06-14 (×3): qty 1

## 2017-06-14 MED ORDER — PANTOPRAZOLE SODIUM 40 MG IV SOLR
40.0000 mg | Freq: Two times a day (BID) | INTRAVENOUS | Status: DC
  Administered 2017-06-14 – 2017-06-16 (×5): 40 mg via INTRAVENOUS
  Filled 2017-06-14 (×5): qty 40

## 2017-06-14 MED ORDER — FLUTICASONE PROPIONATE 50 MCG/ACT NA SUSP
2.0000 | Freq: Two times a day (BID) | NASAL | Status: DC
  Administered 2017-06-14 – 2017-06-19 (×9): 2 via NASAL
  Filled 2017-06-14: qty 16

## 2017-06-14 NOTE — Progress Notes (Signed)
Name: Anita Murphy MRN: [id number] DOB: 1941/05/26    ADMISSION DATE:  06/11/2017 CONSULTATION DATE:  06/14/2017  REFERRING MD :  Isidoro Donning, [facility]  CHIEF COMPLAINT:  Respiratory distress  HISTORY OF PRESENT ILLNESS:  76 year old ex-smoker with severe emphysema and chronic respiratory failure maintained on 2 L oxygen admitted with 2 days of shortness of breath on 6/24. She denied cough or sputum production or fevers or chills or sick contacts. She was noted to have increased work of breathing and BiPAP was used transiently with good improvement. She was also treated as a COPD exacerbation with IV steroids and bronchodilators with mild improvement. We're consulted to assist with management and also comment on finding of pulmonary hypertension. She has seen my partner Dr. [name] last in 12/2015 and has been lost to follow-up since. She has been maintained on a regimen of Pulmicort and Perforomist twice daily nebs with Atrovent and Xopenex for breakthrough. She quit smoking in 2004 and is reportedly compliant with her medications. Her activity is limited to walking around the house with oxygen. She is compliant with 24-hour oxygen. She has lost significant weight in the past few years and was as low as 80 pounds but now has gained back to her current weight of 106 pounds. She was placed on Megace by her PCP. She denies pulmonary edema or abdominal swelling   Significant tests/ events reviewed   Echo 12/2015 shows D-shaped septum with severe pulmonary hypertension PFT's  08/21/2013   FEV1  0.52 ( 33%) ratio 42 and no change B2 CT chest 11/2016 shows severe bullous emphysema   SUBJECTIVE:  Feels a little better  VITAL SIGNS: Temp:  [97.9 F (36.6 C)-98.3 F (36.8 C)] 98.3 F (36.8 C) (06/27 1146) Pulse Rate:  [70-152] 70 (06/27 1146) Resp:  [19-26] 25 (06/27 1146) BP: (85-137)/(56-108) 102/59 (06/27 1146) SpO2:  [91 %-100 %] 91 % (06/27 1146) Weight:  [88 lb 14.4 oz (40.3 kg)] 88 lb 14.4 oz (40.3  kg) (06/26 2159)  Physical Exam  Constitutional: She is oriented to person, place, and time. She appears cachectic. She is cooperative.  Non-toxic appearance. She has a sickly appearance. No distress. Nasal cannula in place.  HENT:  Head: Normocephalic and atraumatic.  Mouth/Throat: Uvula is midline and mucous membranes are normal. No oral lesions. Oropharyngeal exudate and posterior oropharyngeal erythema present.  Eyes: Conjunctivae and EOM are normal. Pupils are equal, round, and reactive to light. Right conjunctiva is not injected. Left conjunctiva is not injected. No scleral icterus.  Neck: Trachea normal. JVD present. No thyroid mass present.  Cardiovascular: Normal rate, regular rhythm and normal heart sounds.   Pulmonary/Chest: Accessory muscle usage present. Tachypnea noted. She has decreased breath sounds. She has wheezes.  Very prolonged faint expiratory wheeze.  Tripod position while in bed + also some upper airway noises   Abdominal: Normal appearance and bowel sounds are normal. She exhibits no shifting dullness and no distension. There is no tenderness.  Neurological: She is alert and oriented to person, place, and time.  Skin: Skin is warm and dry. She is not diaphoretic. No pallor.  Psychiatric: She has a normal mood and affect. Her speech is normal and behavior is normal.      Recent Labs Lab 06/12/17 0156 06/13/17 0322 06/14/17 0539  NA 140 137 139  K 4.3 4.6 4.5  CL 98* 100* 97*  CO2 33* 32 38*  BUN 9 13 15   CREATININE 0.59 0.45 0.46  GLUCOSE 152* 134* 118*  Recent Labs Lab 06/12/17 0156 06/13/17 0322 06/14/17 0539  HGB 11.9* 10.2* 10.2*  HCT 40.0 34.3* 35.8*  WBC 4.5 10.2 8.4  PLT 218 202 197   No results found.  ASSESSMENT / PLAN:  Acute on chronic respiratory failure with hypoxia and hypercarbia Acute exacerbation of COPD -some improvement.  -still w/ sig WOB Plan Added flonase (for Post nasal gtt) Added PPI (need to be sure we've  reduced all factors which can continue to exacerbate her lung disease.  Cont BDs Cont steroid at current dose Would continue to use the BIPAP at night but also think we should use it AS NEEDED during day too.  Levaquin 5-7d Will need to decide if she needs HS bipap at dc    Severe pulmonary hypertension- WHO class 3 Cont oxygen  No role for pulm VDs    06/14/2017, 1:08 PM   STAFF NOTE: I, Rory Percyaniel Feinstein, MD FACP have personally reviewed patient's available data, including medical history, events of note, physical examination and test results as part of my evaluation. I have discussed with resident/NP and other care providers such as pharmacist, RN and RRT. In addition, I personally evaluated patient and elicited key findings of: awake, mild distress with retractions and accessory muscle use, speaking 1-3 words, distant BS, jvd present,  pcxr I reviewed showed large lung volumes, no defined infiltrate, given her continues accessory musle use would keep in sdu, support with NIMV nocturnal primary or prn to rest accessory muscle use, keep steroids same, add ppi increase, she has no reserve, if she were to worsen she would NEVER come off vent I discussed DNI status she is to discuss also with family,  Maintain Bders, open nares with flonase, repeat pcxr, I will follow  Mcarthur Rossettianiel J. Tyson AliasFeinstein, MD, FACP Pgr: [phone] [facility] 06/14/2017 1:42 PM

## 2017-06-14 NOTE — Progress Notes (Signed)
PROGRESS NOTE    Idell PicklesDelores Florido   MVH:[id number]  DOB: 1941-10-12  DOA: 06/11/2017 PCP: Ethelda ChickSmith, Kristi M, MD   Brief Narrative:  Patient is a 76 year old female with hypertension, tobacco abuse, O2 dependent COPD, severe pulmonary hypertension, presented with shortness of breath for 2 days. She has a mild increase in cough and diffuse chest pain when she coughs. No fever or chills.  In ED, patient was noticed to be tachypneic with increased work of breathing and was placed on BiPAP. Chest x-ray showed COPD with emphysema.   Subjective: Feels better when evaluated this AM. She had just had a breathing treatment. Cough is mild and sputum is clear.  Called later by RN. Moving from the bed to the commode made her very short of braeth and she was noted to be having difficult breathing while being evaluated by PCCM and a STAT breathing treatment was ordered.   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 - cont protein supplements - she has been taking her "appetite pills" only intermittently and not as prescribed- none-the - less, her appetite is improved and she has gained some weight    Moderate to severe pulmonary hypertension   Cor pulmonale (chronic)     Left ventricular diastolic dysfunction - - 2-D echo 2017 showed EF of 60-65% with grade 1 diastolic dysfunction, severe pulmonary hypertension, PA pressure 82    HTN (hypertension) - cont Norvasc  AOCD - stable   DVT prophylaxis: Lovenox Code Status: Full code Family  Communication: daughter at bedside Disposition Plan: home when stable Consultants:   PCCM Procedures:    Antimicrobials:  Anti-infectives    Start     Dose/Rate Route Frequency Ordered Stop   06/14/17 1600  levofloxacin (LEVAQUIN) tablet 500 mg     500 mg Oral Daily 06/14/17 0831     06/11/17 1615  levofloxacin (LEVAQUIN) IVPB 500 mg  Status:  Discontinued     500 mg 100 mL/hr over 60 Minutes Intravenous Every 24 hours 06/11/17 1542 06/14/17 0831       Objective: Vitals:   06/14/17 0732 06/14/17 0803 06/14/17 1146 06/14/17 1200  BP:  (!) 137/108 (!) 102/59 134/72  Pulse:  (!) 152 70 (!) 107  Resp:  20 (!) 25 (!) 23  Temp:  97.9 F (36.6 C) 98.3 F (36.8 C)   TempSrc:  Oral Oral   SpO2: 98% 93% 91% 99%  Weight:      Height:        Intake/Output Summary (Last 24 hours) at 06/14/17 1505 Last data filed at 06/14/17 1332  Gross per 24 hour  Intake              730 ml  Output             1050 ml  Net             -320 ml   Filed Weights   06/12/17 0320 06/13/17 0359 06/13/17 2159  Weight: 49.6 kg (109 lb 4.8 oz) 48.7 kg (107 lb 6.4 oz) 40.3 kg (88 lb  14.4 oz)    Examination: General exam:   - cachetic female sitting up in bed in no acute distress HEENT: PERRLA, oral mucosa moist, no sclera icterus or thrush Respiratory system: Clear to auscultation with very poor air movement . Respiratory effort normal. Cardiovascular system: S1 & S2 heard, RRR.  No murmurs  Gastrointestinal system: Abdomen soft, non-tender, nondistended. Normal bowel sound. No organomegaly Central nervous system: Alert and oriented. No focal neurological deficits. Extremities: No cyanosis, clubbing or edema Skin: No rashes or ulcers Psychiatry:  Mood & affect appropriate.     Data Reviewed: I have personally reviewed following labs and imaging studies  CBC:  Recent Labs Lab 06/11/17 1051 06/12/17 0156 06/13/17 0322 06/14/17 0539  WBC 8.3 4.5 10.2 8.4  NEUTROABS 5.7  --   --   --     HGB 12.7 11.9* 10.2* 10.2*  HCT 42.5 40.0 34.3* 35.8*  MCV 89.9 89.1 88.9 90.4  PLT 274 218 202 197   Basic Metabolic Panel:  Recent Labs Lab 06/11/17 1051 06/11/17 2132 06/12/17 0156 06/13/17 0322 06/14/17 0539  NA 136  --  140 137 139  K 4.1  --  4.3 4.6 4.5  CL 94*  --  98* 100* 97*  CO2 30  --  33* 32 38*  GLUCOSE 152*  --  152* 134* 118*  BUN 6  --  9 13 15   CREATININE 0.59  --  0.59 0.45 0.46  CALCIUM 9.4  --  9.2 8.8* 8.8*  MG  --  1.7  --   --   --   PHOS  --  3.9  --   --   --    GFR: Estimated Creatinine Clearance: 39.8 mL/min (by C-G formula based on SCr of 0.46 mg/dL). Liver Function Tests:  Recent Labs Lab 06/12/17 0156  AST 26  ALT 19  ALKPHOS 34*  BILITOT 0.5  PROT 6.3*  ALBUMIN 3.8   No results for input(s): LIPASE, AMYLASE in the last 168 hours. No results for input(s): AMMONIA in the last 168 hours. Coagulation Profile: No results for input(s): INR, PROTIME in the last 168 hours. Cardiac Enzymes: No results for input(s): CKTOTAL, CKMB, CKMBINDEX, TROPONINI in the last 168 hours. BNP (last 3 results) No results for input(s): PROBNP in the last 8760 hours. HbA1C: No results for input(s): HGBA1C in the last 72 hours. CBG:  Recent Labs Lab 06/11/17 2030 06/11/17 2331 06/12/17 0321 06/12/17 0731  GLUCAP 156* 146* 131* 129*   Lipid Profile: No results for input(s): CHOL, HDL, LDLCALC, TRIG, CHOLHDL, LDLDIRECT in the last 72 hours. Thyroid Function Tests: No results for input(s): TSH, T4TOTAL, FREET4, T3FREE, THYROIDAB in the last 72 hours. Anemia Panel: No results for input(s): VITAMINB12, FOLATE, FERRITIN, TIBC, IRON, RETICCTPCT in the last 72 hours. Urine analysis:    Component Value Date/Time   BILIRUBINUR neg 12/05/2012 1522   PROTEINUR neg 12/05/2012 1522   UROBILINOGEN 0.2 12/05/2012 1522   NITRITE neg 12/05/2012 1522   LEUKOCYTESUR Negative 12/05/2012 1522   Sepsis  Labs: @LABRCNTIP (procalcitonin:4,lacticidven:4) ) Recent Results (from the past 240 hour(s))  MRSA PCR Screening     Status: None   Collection Time: 06/11/17  5:14 PM  Result Value Ref Range Status   MRSA by PCR NEGATIVE NEGATIVE Final    Comment:        The GeneXpert MRSA Assay (FDA approved for NASAL specimens only), is one component of a comprehensive MRSA colonization surveillance program. It is not intended to  diagnose MRSA infection nor to guide or monitor treatment for MRSA infections.          Radiology Studies: No results found.    Scheduled Meds: . amLODipine  2.5 mg Oral Daily  . arformoterol  15 mcg Nebulization BID  . aspirin  81 mg Oral Daily  . budesonide  0.25 mg Nebulization BID  . chlorpheniramine-HYDROcodone  5 mL Oral Q12H  . enoxaparin (LOVENOX) injection  40 mg Subcutaneous Q24H  . feeding supplement (ENSURE ENLIVE)  237 mL Oral TID BM  . fluticasone  2 spray Each Nare BID  . ipratropium-albuterol  3 mL Nebulization Q6H  . levofloxacin  500 mg Oral Daily  . methylPREDNISolone (SOLU-MEDROL) injection  60 mg Intravenous Q12H  . mirtazapine  7.5 mg Oral QHS  . pantoprazole (PROTONIX) IV  40 mg Intravenous Q12H  . sodium chloride flush  3 mL Intravenous Q12H   Continuous Infusions: . albuterol 10 mg/hr (06/11/17 1459)     LOS: 2 days    Time spent in minutes: 35    Calvert Cantor, MD [facility] Pager: [email or web address] Password [facility] 06/14/2017, 3:05 PM

## 2017-06-14 NOTE — Progress Notes (Signed)
PT placed on BiPAP for distress- RN aware.

## 2017-06-14 NOTE — Progress Notes (Signed)
MD Tyson AliasFeinstein at bedside, patient reports feeling short of breath. Breath sounds diminished with expiratory wheeze all fields. Patient using accessory muscles and has labored breathing. Transfer to med-surg floor has been cancelled per MD. RT is in room administering scheduled breathing treatments.

## 2017-06-15 LAB — BASIC METABOLIC PANEL
Anion gap: 6 (ref 5–15)
BUN: 16 mg/dL (ref 6–20)
CALCIUM: 9 mg/dL (ref 8.9–10.3)
CO2: 39 mmol/L — AB (ref 22–32)
CREATININE: 0.46 mg/dL (ref 0.44–1.00)
Chloride: 96 mmol/L — ABNORMAL LOW (ref 101–111)
GFR calc non Af Amer: >60 mL/min (ref >60)
Glucose, Bld: 102 mg/dL — ABNORMAL HIGH (ref 65–99)
Potassium: 5 mmol/L (ref 3.5–5.1)
SODIUM: 141 mmol/L (ref 135–145)

## 2017-06-15 LAB — CBC
HCT: 37.9 % (ref 36.0–46.0)
Hemoglobin: 10.6 g/dL — ABNORMAL LOW (ref 12.0–15.0)
MCH: 25.6 pg — AB (ref 26.0–34.0)
MCHC: 28 g/dL — AB (ref 30.0–36.0)
MCV: 91.5 fL (ref 78.0–100.0)
Platelets: 215 10*3/uL (ref 150–400)
RBC: 4.14 MIL/uL (ref 3.87–5.11)
RDW: 13.4 % (ref 11.5–15.5)
WBC: 9.3 10*3/uL (ref 4.0–10.5)

## 2017-06-15 NOTE — Progress Notes (Signed)
Name: Anita Murphy MRN: [id number] DOB: 01/19/1941    ADMISSION DATE:  06/11/2017 CONSULTATION DATE:  06/15/2017  REFERRING MD :  Isidoro Donning, [facility]  CHIEF COMPLAINT:  Respiratory distress  HISTORY OF PRESENT ILLNESS:  76 year old ex-smoker with severe emphysema and chronic respiratory failure maintained on 2 L oxygen admitted with 2 days of shortness of breath on 6/24. She denied cough or sputum production or fevers or chills or sick contacts. She was noted to have increased work of breathing and BiPAP was used transiently with good improvement. She was also treated as a COPD exacerbation with IV steroids and bronchodilators with mild improvement. We're consulted to assist with management and also comment on finding of pulmonary hypertension. She has seen my partner Dr. [name] last in 12/2015 and has been lost to follow-up since. She has been maintained on a regimen of Pulmicort and Perforomist twice daily nebs with Atrovent and Xopenex for breakthrough. She quit smoking in 2004 and is reportedly compliant with her medications. Her activity is limited to walking around the house with oxygen. She is compliant with 24-hour oxygen. She has lost significant weight in the past few years and was as low as 80 pounds but now has gained back to her current weight of 106 pounds. She was placed on Megace by her PCP. She denies pulmonary edema or abdominal swelling   Significant tests/ events reviewed   Echo 12/2015 shows D-shaped septum with severe pulmonary hypertension PFT's  08/21/2013   FEV1  0.52 ( 33%) ratio 42 and no change B2 CT chest 11/2016 shows severe bullous emphysema   SUBJECTIVE:  She is stronger She feels less SOB No chest pain More reserve  Used NIMV with success  VITAL SIGNS: Temp:  [96.6 F (35.9 C)-98.9 F (37.2 C)] 98.9 F (37.2 C) (06/28 0800) Pulse Rate:  [51-120] 51 (06/28 0800) Resp:  [12-30] 20 (06/28 0800) BP: (100-134)/(43-99) 109/88 (06/28 0800) SpO2:  [91 %-100 %] 93 %  (06/28 0800) FiO2 (%):  [40 %] 40 % (06/28 0334) Weight:  [50.8 kg (112 lb)] 50.8 kg (112 lb) (06/28 0500)  Physical Exam  Constitutional: She appears cachectic. She is cooperative.  Non-toxic appearance. She has a sickly appearance. No distress. Nasal cannula in place.  HENT:  Head: Normocephalic.  Mouth/Throat: Uvula is midline and mucous membranes are normal. No oral lesions. Posterior oropharyngeal erythema present.  Eyes: Pupils are equal, round, and reactive to light. Right conjunctiva is not injected. Left conjunctiva is not injected.  Neck: Trachea normal. No tracheal deviation present. No thyroid mass and no thyromegaly present.  Cardiovascular: Normal rate.  Exam reveals no gallop.   No murmur heard. Pulmonary/Chest: Accessory muscle usage present. No stridor. No tachypnea. No respiratory distress. She has decreased breath sounds. She has wheezes. She has no rales. She exhibits no tenderness.  Abdominal: Normal appearance. She exhibits no shifting dullness.  Skin: She is not diaphoretic.  Psychiatric: Her speech is normal.      Recent Labs Lab 06/13/17 0322 06/14/17 0539 06/15/17 0419  NA 137 139 141  K 4.6 4.5 5.0  CL 100* 97* 96*  CO2 32 38* 39*  BUN 13 15 16   CREATININE 0.45 0.46 0.46  GLUCOSE 134* 118* 102*    Recent Labs Lab 06/13/17 0322 06/14/17 0539 06/15/17 0419  HGB 10.2* 10.2* 10.6*  HCT 34.3* 35.8* 37.9  WBC 10.2 8.4 9.3  PLT 202 197 215   Dg Chest Port 1 View  Result Date: 06/14/2017 CLINICAL DATA:  COPD, followup EXAM: PORTABLE CHEST 1 VIEW COMPARISON:  06/11/2017 FINDINGS: Normal heart size, mediastinal contours, and pulmonary vascularity. Minimal atherosclerotic calcification aorta. Severe emphysematous changes consistent with COPD. No acute infiltrate, pleural effusion or pneumothorax. Bones demineralized. IMPRESSION: Severe COPD changes without acute infiltrate. Aortic Atherosclerosis (ICD10-I70.0) and Emphysema (ICD10-J43.9). Electronically  Signed   By: Ulyses SouthwardMark  Boles M.D.   On: 06/14/2017 15:14    ASSESSMENT / PLAN:  Acute on chronic respiratory failure with hypoxia and hypercarbia Acute exacerbation of COPD -some improvement. 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 Tyson AliasFeinstein, MD, FACP Pgr: [phone] [facility] 06/15/2017 10:41 AM

## 2017-06-15 NOTE — Progress Notes (Signed)
PROGRESS NOTE    Anita Murphy   ZOX:[id number]  DOB: August 15, 1941  DOA: 06/11/2017 PCP: [name], MD   Brief Narrative:  Patient is a 76 year old female with hypertension, tobacco abuse, O2 dependent COPD, severe pulmonary hypertension, presented with shortness of breath for 2 days. She has a mild increase in cough and diffuse chest pain when she coughs. No fever or chills.  In ED, patient was noticed to be tachypneic with increased work of breathing and was placed on BiPAP. Chest x-ray showed COPD with emphysema.   Subjective: Feels better today as far as breathing and coughing. Has not ambulated yet to know if she has dyspnea on exertion. No new complaints today.   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 - cont protein supplements - she has been taking her "appetite pills" only intermittently and not as prescribed- none-the - less, her appetite is improved and she has gained some weight    Moderate to severe pulmonary hypertension   Cor pulmonale (chronic)     Left ventricular diastolic dysfunction - - 2-D echo 2017 showed EF of 60-65% with grade 1 diastolic dysfunction, severe pulmonary hypertension, PA pressure 82    HTN (hypertension) - cont Norvasc  AOCD - stable   DVT prophylaxis: Lovenox Code Status: Full code Family Communication: daughter   Disposition Plan: home when stable Consultants:   PCCM Procedures:    Antimicrobials:  Anti-infectives    Start     Dose/Rate Route Frequency Ordered Stop   06/14/17 1600  levofloxacin (LEVAQUIN) tablet 500 mg     500 mg Oral Daily 06/14/17 0831     06/11/17 1615  levofloxacin (LEVAQUIN) IVPB 500 mg  Status:  Discontinued     500 mg 100 mL/hr over 60 Minutes Intravenous Every 24 hours 06/11/17 1542 06/14/17 0831       Objective: Vitals:   06/15/17 0800 06/15/17 1142 06/15/17 1315 06/15/17 1637  BP: 109/88 109/88  129/71  Pulse: (!) 51 (!) 106  (!) 108  Resp: 20 (!) 23  (!) 22  Temp: 98.9 F (37.2 C) 98.7 F (37.1 C)  98.1 F (36.7 C)  TempSrc: Oral Oral  Oral  SpO2: 93% 100% 100% 99%  Weight:      Height:        Intake/Output Summary (Last 24 hours) at 06/15/17 1648 Last data filed at 06/15/17 1640  Gross per 24 hour  Intake              720 ml  Output              750 ml  Net              -30 ml   Filed Weights   06/13/17 0359 06/13/17 2159 06/15/17 0500  Weight: 48.7 kg (107 lb 6.4 oz) 40.3 kg (88 lb 14.4 oz) 50.8 kg (112 lb)    Examination: General exam:   - cachetic female sitting up in bed in no acute distress HEENT: PERRLA, oral mucosa moist, no sclera icterus or  thrush Respiratory system: Clear to auscultation with very poor air movement . Respiratory effort normal. Cardiovascular system: S1 & S2 heard, RRR.  No murmurs  Gastrointestinal system: Abdomen soft, non-tender, nondistended. Normal bowel sound. No organomegaly Central nervous system: Alert and oriented. No focal neurological deficits. Extremities: No cyanosis, clubbing or edema Skin: No rashes or ulcers Psychiatry:  Mood & affect appropriate.     Data Reviewed: I have personally reviewed following labs and imaging studies  CBC:  Recent Labs Lab 06/11/17 1051 06/12/17 0156 06/13/17 0322 06/14/17 0539 06/15/17 0419  WBC 8.3 4.5 10.2 8.4 9.3  NEUTROABS 5.7  --   --   --   --   HGB 12.7 11.9* 10.2* 10.2* 10.6*  HCT 42.5 40.0 34.3* 35.8* 37.9  MCV 89.9 89.1 88.9 90.4 91.5  PLT 274 218 202 197 215   Basic Metabolic Panel:  Recent Labs Lab  06/11/17 1051 06/11/17 2132 06/12/17 0156 06/13/17 0322 06/14/17 0539 06/15/17 0419  NA 136  --  140 137 139 141  K 4.1  --  4.3 4.6 4.5 5.0  CL 94*  --  98* 100* 97* 96*  CO2 30  --  33* 32 38* 39*  GLUCOSE 152*  --  152* 134* 118* 102*  BUN 6  --  9 13 15 16   CREATININE 0.59  --  0.59 0.45 0.46 0.46  CALCIUM 9.4  --  9.2 8.8* 8.8* 9.0  MG  --  1.7  --   --   --   --   PHOS  --  3.9  --   --   --   --    GFR: Estimated Creatinine Clearance: 49.5 mL/min (by C-G formula based on SCr of 0.46 mg/dL). Liver Function Tests:  Recent Labs Lab 06/12/17 0156  AST 26  ALT 19  ALKPHOS 34*  BILITOT 0.5  PROT 6.3*  ALBUMIN 3.8   No results for input(s): LIPASE, AMYLASE in the last 168 hours. No results for input(s): AMMONIA in the last 168 hours. Coagulation Profile: No results for input(s): INR, PROTIME in the last 168 hours. Cardiac Enzymes: No results for input(s): CKTOTAL, CKMB, CKMBINDEX, TROPONINI in the last 168 hours. BNP (last 3 results) No results for input(s): PROBNP in the last 8760 hours. HbA1C: No results for input(s): HGBA1C in the last 72 hours. CBG:  Recent Labs Lab 06/11/17 2030 06/11/17 2331 06/12/17 0321 06/12/17 0731 06/14/17 1544  GLUCAP 156* 146* 131* 129* 95   Lipid Profile: No results for input(s): CHOL, HDL, LDLCALC, TRIG, CHOLHDL, LDLDIRECT in the last 72 hours. Thyroid Function Tests: No results for input(s): TSH, T4TOTAL, FREET4, T3FREE, THYROIDAB in the last 72 hours. Anemia Panel: No results for input(s): VITAMINB12, FOLATE, FERRITIN, TIBC, IRON, RETICCTPCT in the last 72 hours. Urine analysis:    Component Value Date/Time   BILIRUBINUR neg 12/05/2012 1522   PROTEINUR neg 12/05/2012 1522   UROBILINOGEN 0.2 12/05/2012 1522   NITRITE neg 12/05/2012 1522   LEUKOCYTESUR Negative 12/05/2012 1522   Sepsis Labs: @LABRCNTIP (procalcitonin:4,lacticidven:4) ) Recent Results (from the past 240 hour(s))  MRSA PCR Screening     Status: None     Collection Time: 06/11/17  5:14 PM  Result Value Ref Range Status   MRSA by PCR NEGATIVE NEGATIVE Final    Comment:        The GeneXpert MRSA Assay (FDA approved for NASAL specimens only), is one component of a comprehensive MRSA colonization surveillance program. It is not intended to diagnose  MRSA infection nor to guide or monitor treatment for MRSA infections.          Radiology Studies: Dg Chest Port 1 View  Result Date: 06/14/2017 CLINICAL DATA:  COPD, followup EXAM: PORTABLE CHEST 1 VIEW COMPARISON:  06/11/2017 FINDINGS: Normal heart size, mediastinal contours, and pulmonary vascularity. Minimal atherosclerotic calcification aorta. Severe emphysematous changes consistent with COPD. No acute infiltrate, pleural effusion or pneumothorax. Bones demineralized. IMPRESSION: Severe COPD changes without acute infiltrate. Aortic Atherosclerosis (ICD10-I70.0) and Emphysema (ICD10-J43.9). Electronically Signed   By: Ulyses Southward M.D.   On: 06/14/2017 15:14      Scheduled Meds: . amLODipine  2.5 mg Oral Daily  . arformoterol  15 mcg Nebulization BID  . aspirin  81 mg Oral Daily  . budesonide  0.25 mg Nebulization BID  . chlorpheniramine-HYDROcodone  5 mL Oral Q12H  . enoxaparin (LOVENOX) injection  40 mg Subcutaneous Q24H  . feeding supplement (ENSURE ENLIVE)  237 mL Oral TID BM  . fluticasone  2 spray Each Nare BID  . ipratropium-albuterol  3 mL Nebulization Q6H  . levofloxacin  500 mg Oral Daily  . methylPREDNISolone (SOLU-MEDROL) injection  60 mg Intravenous Q12H  . mirtazapine  7.5 mg Oral QHS  . pantoprazole (PROTONIX) IV  40 mg Intravenous Q12H  . sodium chloride flush  3 mL Intravenous Q12H   Continuous Infusions: . albuterol 10 mg/hr (06/11/17 1459)     LOS: 3 days    Time spent in minutes: 35    Calvert Cantor, MD [facility] Pager: [email or web address] Password [facility] 06/15/2017, 4:48 PM

## 2017-06-15 NOTE — Progress Notes (Signed)
Patient educated on COPD disease process, palliative medicine, and energy conservation strategies. Patient request to speak with palliative medicine team during admission to plan for home life after discharge.

## 2017-06-16 LAB — BASIC METABOLIC PANEL
Anion gap: 4 — ABNORMAL LOW (ref 5–15)
Anion gap: 7 (ref 5–15)
BUN: 12 mg/dL (ref 6–20)
BUN: 15 mg/dL (ref 6–20)
CALCIUM: 8.7 mg/dL — AB (ref 8.9–10.3)
CALCIUM: 8.9 mg/dL (ref 8.9–10.3)
CHLORIDE: 92 mmol/L — AB (ref 101–111)
CHLORIDE: 91 mmol/L — AB (ref 101–111)
CO2: 43 mmol/L — AB (ref 22–32)
CO2: 37 mmol/L — AB (ref 22–32)
CREATININE: 0.48 mg/dL (ref 0.44–1.00)
Creatinine, Ser: 0.47 mg/dL (ref 0.44–1.00)
GFR calc Af Amer: >60 mL/min (ref >60)
GFR calc non Af Amer: >60 mL/min (ref >60)
GFR calc non Af Amer: >60 mL/min (ref >60)
GLUCOSE: 154 mg/dL — AB (ref 65–99)
GLUCOSE: 188 mg/dL — AB (ref 65–99)
Potassium: 5.3 mmol/L — ABNORMAL HIGH (ref 3.5–5.1)
Potassium: 4.5 mmol/L (ref 3.5–5.1)
Sodium: 139 mmol/L (ref 135–145)
Sodium: 135 mmol/L (ref 135–145)

## 2017-06-16 MED ORDER — METHYLPREDNISOLONE SODIUM SUCC 125 MG IJ SOLR
60.0000 mg | Freq: Every day | INTRAMUSCULAR | Status: DC
  Administered 2017-06-17: 60 mg via INTRAVENOUS
  Filled 2017-06-16: qty 2

## 2017-06-16 MED ORDER — SODIUM POLYSTYRENE SULFONATE 15 GM/60ML PO SUSP
30.0000 g | Freq: Once | ORAL | Status: AC
  Administered 2017-06-16: 30 g via ORAL
  Filled 2017-06-16: qty 120

## 2017-06-16 MED ORDER — BUDESONIDE 0.25 MG/2ML IN SUSP
0.5000 mg | Freq: Two times a day (BID) | RESPIRATORY_TRACT | Status: DC
  Administered 2017-06-16 – 2017-06-19 (×6): 0.5 mg via RESPIRATORY_TRACT
  Filled 2017-06-16 (×6): qty 4

## 2017-06-16 MED ORDER — PANTOPRAZOLE SODIUM 40 MG PO TBEC
40.0000 mg | DELAYED_RELEASE_TABLET | Freq: Two times a day (BID) | ORAL | Status: DC
  Administered 2017-06-16 – 2017-06-19 (×6): 40 mg via ORAL
  Filled 2017-06-16 (×6): qty 1

## 2017-06-16 NOTE — Progress Notes (Signed)
PROGRESS NOTE    Anita Murphy   ZOX:[id number]  DOB: [birth year]  DOA: 06/11/2017 PCP: [name], MD   Brief Narrative:  Patient is a 76 year old female with hypertension, tobacco abuse, O2 dependent COPD, severe pulmonary hypertension, presented with shortness of breath for 2 days. She has a mild increase in cough and diffuse chest pain when she coughs. No fever or chills.  In ED, patient was noticed to be tachypneic with increased work of breathing and was placed on BiPAP. Chest x-ray showed COPD with emphysema.   Subjective: Dyspnea improving. Cough is mild. Feels better today. No new symptoms such as chest pain, headaches, nausea, vomiting, diarrhea or dysuria.   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 - cont protein supplements - she has been taking her "appetite pills" only intermittently and not as prescribed- none-the - less, her appetite is improved and she has gained some weight     Moderate to severe pulmonary hypertension   Cor pulmonale (chronic)     Left ventricular diastolic dysfunction - - 2-D echo 2017 showed EF of 60-65% with grade 1 diastolic dysfunction, severe pulmonary hypertension, PA pressure 82    HTN (hypertension) - cont Norvasc  AOCD - stable   DVT prophylaxis: Lovenox Code Status: Full code Family Communication: daughter   Disposition Plan: home when  stable Consultants:   PCCM Procedures:    Antimicrobials:  Anti-infectives    Start     Dose/Rate Route Frequency Ordered Stop   06/14/17 1600  levofloxacin (LEVAQUIN) tablet 500 mg     500 mg Oral Daily 06/14/17 0831     06/11/17 1615  levofloxacin (LEVAQUIN) IVPB 500 mg  Status:  Discontinued     500 mg 100 mL/hr over 60 Minutes Intravenous Every 24 hours 06/11/17 1542 06/14/17 0831       Objective: Vitals:   06/16/17 0703 06/16/17 0728 06/16/17 0731 06/16/17 0734  BP: 115/69     Pulse: 90     Resp: 18     Temp: 98.1 F (36.7 C)     TempSrc: Oral     SpO2: 97% 99% 100% 100%  Weight:      Height:        Intake/Output Summary (Last 24 hours) at 06/16/17 1029 Last data filed at 06/16/17 0924  Gross per 24 hour  Intake             1055 ml  Output             1500 ml  Net             -445 ml   Filed Weights   06/13/17 2159 06/15/17 0500 06/16/17 0500  Weight: 40.3 kg (88 lb 14.4 oz) 50.8 kg (112 lb) 41.2 kg (90 lb 12.8 oz)    Examination: General exam:   - cachetic female sitting up in bed in no acute distress HEENT: PERRLA, oral mucosa moist,  no sclera icterus or thrush Respiratory system: lungs are clear bilaterally- air movement poor.  Respiratory effort normal. Cardiovascular system: S1 & S2 heard, RRR.  No murmurs  Gastrointestinal system: Abdomen soft, non-tender, nondistended. Normal bowel sound. No organomegaly Central nervous system: Alert and oriented. No focal neurological deficits. Extremities: No cyanosis, clubbing or edema Skin: No rashes or ulcers Psychiatry:  Mood & affect appropriate.     Data Reviewed: I have personally reviewed following labs and imaging studies  CBC:  Recent Labs Lab 06/11/17 1051 06/12/17 0156 06/13/17 0322 06/14/17 0539 06/15/17 0419  WBC 8.3 4.5 10.2 8.4 9.3  NEUTROABS 5.7  --   --   --   --   HGB 12.7 11.9* 10.2* 10.2* 10.6*  HCT 42.5 40.0 34.3* 35.8* 37.9  MCV 89.9 89.1 88.9 90.4 91.5  PLT 274 218 202 197  215   Basic Metabolic Panel:  Recent Labs Lab 06/11/17 2132 06/12/17 0156 06/13/17 0322 06/14/17 0539 06/15/17 0419 06/16/17 0426  NA  --  140 137 139 141 139  K  --  4.3 4.6 4.5 5.0 5.3*  CL  --  98* 100* 97* 96* 92*  CO2  --  33* 32 38* 39* 43*  GLUCOSE  --  152* 134* 118* 102* 154*  BUN  --  9 13 15 16 12   CREATININE  --  0.59 0.45 0.46 0.46 0.47  CALCIUM  --  9.2 8.8* 8.8* 9.0 8.7*  MG 1.7  --   --   --   --   --   PHOS 3.9  --   --   --   --   --    GFR: Estimated Creatinine Clearance: 40.7 mL/min (by C-G formula based on SCr of 0.47 mg/dL). Liver Function Tests:  Recent Labs Lab 06/12/17 0156  AST 26  ALT 19  ALKPHOS 34*  BILITOT 0.5  PROT 6.3*  ALBUMIN 3.8   No results for input(s): LIPASE, AMYLASE in the last 168 hours. No results for input(s): AMMONIA in the last 168 hours. Coagulation Profile: No results for input(s): INR, PROTIME in the last 168 hours. Cardiac Enzymes: No results for input(s): CKTOTAL, CKMB, CKMBINDEX, TROPONINI in the last 168 hours. BNP (last 3 results) No results for input(s): PROBNP in the last 8760 hours. HbA1C: No results for input(s): HGBA1C in the last 72 hours. CBG:  Recent Labs Lab 06/11/17 2030 06/11/17 2331 06/12/17 0321 06/12/17 0731 06/14/17 1544  GLUCAP 156* 146* 131* 129* 95   Lipid Profile: No results for input(s): CHOL, HDL, LDLCALC, TRIG, CHOLHDL, LDLDIRECT in the last 72 hours. Thyroid Function Tests: No results for input(s): TSH, T4TOTAL, FREET4, T3FREE, THYROIDAB in the last 72 hours. Anemia Panel: No results for input(s): VITAMINB12, FOLATE, FERRITIN, TIBC, IRON, RETICCTPCT in the last 72 hours. Urine analysis:    Component Value Date/Time   BILIRUBINUR neg 12/05/2012 1522   PROTEINUR neg 12/05/2012 1522   UROBILINOGEN 0.2 12/05/2012 1522   NITRITE neg 12/05/2012 1522   LEUKOCYTESUR Negative 12/05/2012 1522   Sepsis Labs: @LABRCNTIP (procalcitonin:4,lacticidven:4) ) Recent Results (from the  past 240 hour(s))  MRSA PCR Screening     Status: None   Collection Time: 06/11/17  5:14 PM  Result Value Ref Range Status   MRSA by PCR NEGATIVE NEGATIVE Final    Comment:        The GeneXpert MRSA Assay (FDA approved for NASAL specimens only), is one component of a comprehensive MRSA colonization surveillance program. It is not intended  to diagnose MRSA infection nor to guide or monitor treatment for MRSA infections.          Radiology Studies: Dg Chest Port 1 View  Result Date: 06/14/2017 CLINICAL DATA:  COPD, followup EXAM: PORTABLE CHEST 1 VIEW COMPARISON:  06/11/2017 FINDINGS: Normal heart size, mediastinal contours, and pulmonary vascularity. Minimal atherosclerotic calcification aorta. Severe emphysematous changes consistent with COPD. No acute infiltrate, pleural effusion or pneumothorax. Bones demineralized. IMPRESSION: Severe COPD changes without acute infiltrate. Aortic Atherosclerosis (ICD10-I70.0) and Emphysema (ICD10-J43.9). Electronically Signed   By: Ulyses Southward M.D.   On: 06/14/2017 15:14      Scheduled Meds: . amLODipine  2.5 mg Oral Daily  . arformoterol  15 mcg Nebulization BID  . aspirin  81 mg Oral Daily  . budesonide  0.25 mg Nebulization BID  . chlorpheniramine-HYDROcodone  5 mL Oral Q12H  . enoxaparin (LOVENOX) injection  40 mg Subcutaneous Q24H  . feeding supplement (ENSURE ENLIVE)  237 mL Oral TID BM  . fluticasone  2 spray Each Nare BID  . ipratropium-albuterol  3 mL Nebulization Q6H  . levofloxacin  500 mg Oral Daily  . methylPREDNISolone (SOLU-MEDROL) injection  60 mg Intravenous Q12H  . mirtazapine  7.5 mg Oral QHS  . pantoprazole  40 mg Oral BID  . sodium chloride flush  3 mL Intravenous Q12H   Continuous Infusions: . albuterol 10 mg/hr (06/11/17 1459)     LOS: 4 days    Time spent in minutes: 35    Calvert Cantor, MD [facility] Pager: [email or web address] Password [facility] 06/16/2017, 10:29 AM

## 2017-06-16 NOTE — Care Management Important Message (Signed)
Important Message  Patient Details  Name: Anita PicklesDelores Murphy MRN: [id number] Date of Birth: 11-12-1941   Medicare Important Message Given:  Yes    Kyla BalzarineShealy, Berl Bonfanti Abena 06/16/2017, 10:40 AM

## 2017-06-16 NOTE — Progress Notes (Signed)
No charge note PMT meeting scheduled for 1:00 pm Saturday 6/30 with dtrs Talbert ForestShirley and Selena BattenKim.  Algis DownsMarianne York, [place]PA-C Palliative Medicine Pager: [phone]

## 2017-06-16 NOTE — Progress Notes (Signed)
Name: Idell PicklesDelores Meanor MRN: [id number] DOB: [birth year]    ADMISSION DATE:  06/11/2017 CONSULTATION DATE:  06/16/2017  REFERRING MD :  Isidoro Donningai, [facility]  CHIEF COMPLAINT:  Respiratory distress  HISTORY OF PRESENT ILLNESS:  76 year old ex-smoker with severe emphysema and chronic respiratory failure maintained on 2 L oxygen admitted with 2 days of shortness of breath on 6/24. She denied cough or sputum production or fevers or chills or sick contacts. She was noted to have increased work of breathing and BiPAP was used transiently with good improvement. She was also treated as a COPD exacerbation with IV steroids and bronchodilators with mild improvement. We're consulted to assist with management and also comment on finding of pulmonary hypertension. She has seen my partner Dr. Sherene Sireswert last in 12/2015 and has been lost to follow-up since. She has been maintained on a regimen of Pulmicort and Perforomist twice daily nebs with Atrovent and Xopenex for breakthrough. She quit smoking in 2004 and is reportedly compliant with her medications. Her activity is limited to walking around the house with oxygen. She is compliant with 24-hour oxygen. She has lost significant weight in the past few years and was as low as 80 pounds but now has gained back to her current weight of 106 pounds. She was placed on Megace by her PCP. She denies pulmonary edema or abdominal swelling   Significant tests/ events reviewed   Echo 12/2015 shows D-shaped septum with severe pulmonary hypertension PFT's  08/21/2013   FEV1  0.52 ( 33%) ratio 42 and no change B2 CT chest 11/2016 shows severe bullous emphysema   SUBJECTIVE:  No acute events.  Desaturated to 88% on 4L while ambulating.  Did not were BiPAP overnight as she felt she didn't need it.  VITAL SIGNS: Temp:  [98.1 F (36.7 C)-98.7 F (37.1 C)] 98.7 F (37.1 C) (06/29 1146) Pulse Rate:  [72-108] 90 (06/29 0703) Resp:  [10-22] 18 (06/29 0703) BP: (105-140)/(69-85) 140/70 (06/29  1146) SpO2:  [97 %-100 %] 100 % (06/29 0734) Weight:  [41.2 kg (90 lb 12.8 oz)] 41.2 kg (90 lb 12.8 oz) (06/29 0500)  Physical Exam  Constitutional: She is oriented to person, place, and time. She appears cachectic. No distress.  HENT:  Head: Normocephalic and atraumatic.  Mouth/Throat: Uvula is midline and mucous membranes are normal. No oral lesions. Posterior oropharyngeal erythema present.  Eyes: Pupils are equal, round, and reactive to light. Right conjunctiva is not injected. Left conjunctiva is not injected.  Neck: Trachea normal. No tracheal deviation present. No thyroid mass and no thyromegaly present.  Cardiovascular: Normal rate.  Exam reveals no gallop.   No murmur heard. Pulmonary/Chest: No stridor. No tachypnea. No respiratory distress. She has decreased breath sounds. She has wheezes. She has no rales. She exhibits no tenderness.  Abdominal: Normal appearance.  Neurological: She is oriented to person, place, and time.  Skin: She is not diaphoretic.  Psychiatric: Her speech is normal.     Recent Labs Lab 06/14/17 0539 06/15/17 0419 06/16/17 0426  NA 139 141 139  K 4.5 5.0 5.3*  CL 97* 96* 92*  CO2 38* 39* 43*  BUN 15 16 12   CREATININE 0.46 0.46 0.47  GLUCOSE 118* 102* 154*    Recent Labs Lab 06/13/17 0322 06/14/17 0539 06/15/17 0419  HGB 10.2* 10.2* 10.6*  HCT 34.3* 35.8* 37.9  WBC 10.2 8.4 9.3  PLT 202 197 215   Dg Chest Port 1 View  Result Date: 06/14/2017 CLINICAL DATA:  COPD, followup EXAM:  PORTABLE CHEST 1 VIEW COMPARISON:  06/11/2017 FINDINGS: Normal heart size, mediastinal contours, and pulmonary vascularity. Minimal atherosclerotic calcification aorta. Severe emphysematous changes consistent with COPD. No acute infiltrate, pleural effusion or pneumothorax. Bones demineralized. IMPRESSION: Severe COPD changes without acute infiltrate. Aortic Atherosclerosis (ICD10-I70.0) and Emphysema (ICD10-J43.9). Electronically Signed   By: Ulyses Southward M.D.   On:  06/14/2017 15:14    ASSESSMENT / PLAN:  Acute on chronic respiratory failure with hypoxia and hypercarbia Acute exacerbation of COPD Plan Not impressed she has any infection, consider dc levofloxacin at 3-5 days Maintain steroids, dropped to QD 6/29.  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 I have discussed with resident/NP and other care providers such as pharmacist, RN and RRT. In addition, I personally evaluated patient and elicited key findings of: awake, eating cake, WOB normal, reduced bs bilateral but improved aeration, no purse lip, no accessory, she walked the unit today, no new pcxr , I reviewed prior, would dc NIMV, would reduce steroids , slow teper, continued Bders, do not believe infection driving, completed course abx, dc levofloxacin, her reserve is improved and has allowed her to ambulate, we should avoid high sats, goal 90%-92%, I updated pt and daughter in full, will sign off, call if needed  Mcarthur Rossetti. Tyson Alias, MD, FACP Pgr: [phone] [place] [facility] 06/16/2017 1:55 PM

## 2017-06-16 NOTE — Progress Notes (Signed)
Patient ambulated in hallway. Shortness of breath noted when ambulating. Patient on 4 liters of oxygen dropped to 88%. Patient back to room. O2 sats increased to 97% at rest. Patient resting comfortable in bed. Denies any discomfort at this time.

## 2017-06-17 DIAGNOSIS — Z515 Encounter for palliative care: Secondary | ICD-10-CM

## 2017-06-17 LAB — BASIC METABOLIC PANEL
Anion gap: 7 (ref 5–15)
BUN: 12 mg/dL (ref 6–20)
CO2: 41 mmol/L — ABNORMAL HIGH (ref 22–32)
Calcium: 8.4 mg/dL — ABNORMAL LOW (ref 8.9–10.3)
Chloride: 91 mmol/L — ABNORMAL LOW (ref 101–111)
Creatinine, Ser: 0.41 mg/dL — ABNORMAL LOW (ref 0.44–1.00)
GFR calc Af Amer: >60 mL/min (ref >60)
GFR calc non Af Amer: >60 mL/min (ref >60)
Glucose, Bld: 85 mg/dL (ref 65–99)
Potassium: 4 mmol/L (ref 3.5–5.1)
Sodium: 139 mmol/L (ref 135–145)

## 2017-06-17 MED ORDER — PREDNISONE 20 MG PO TABS
60.0000 mg | ORAL_TABLET | Freq: Every day | ORAL | Status: DC
  Administered 2017-06-18 – 2017-06-19 (×2): 60 mg via ORAL
  Filled 2017-06-17 (×2): qty 3

## 2017-06-17 NOTE — Progress Notes (Signed)
Pt was asked twice to ambulate in the hallway but she refused both times , she said she felt tired since she had a lot of visitors today she said that she will try later tonight.

## 2017-06-17 NOTE — Progress Notes (Signed)
PROGRESS NOTE    Anita Murphy   UEA:[id number]  DOB: 1941-08-09  DOA: 06/11/2017 PCP: [name], MD   Brief Narrative:  Patient is a 76 year old female with hypertension, tobacco abuse, O2 dependent COPD, severe pulmonary hypertension, presented with shortness of breath for 2 days. She has a mild increase in cough and diffuse chest pain when she coughs. No fever or chills.  In ED, patient was noticed to be tachypneic with increased work of breathing and was placed on BiPAP. Chest x-ray showed COPD with emphysema.   Subjective: Shortness of breath and cough continue to improve. She has been able to ambulate to the end of the hall yesterday. No nausea, vomiting, constipation, diarrhea, dysuria or chest pain today. No other complaints.   Assessment & Plan:   Principal Problem:   Acute on chronic respiratory failure with hypoxia / COPD exacerbation  - frail patient with weight loss and severe COPD- have reinforced that she should be a DNR- she is not yet ready to make a decision on this- palliative care called for discussion of GOC - cont current management with IV steroids, Nebs, Mucinex, Levaquin and aggressive pulmonary toilet - BiPAP as needed - Pulmonary assisting with management- weaning steroids -last dose of Solumedrol today and then start Prednisone tomorrow - symptoms improving- continue to ambulate and follow pulse ox   Active Problems:     Protein-calorie malnutrition, 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 - cont protein supplements - she has been taking her "appetite pills" only intermittently and not as prescribed- none-the - less, her appetite is improved and she has gained some weight     Moderate to severe pulmonary hypertension   Cor pulmonale (chronic)     Left ventricular diastolic dysfunction - - 2-D echo 2017 showed EF of 60-65% with grade 1 diastolic dysfunction, severe pulmonary hypertension, PA pressure 82    HTN (hypertension) - cont Norvasc  AOCD -  stable   DVT prophylaxis: Lovenox Code Status: Full code Family Communication: daughter   Disposition Plan: home when stable Consultants:   PCCM Procedures:    Antimicrobials:  Anti-infectives    Start     Dose/Rate Route Frequency Ordered Stop   06/14/17 1600  levofloxacin (LEVAQUIN) tablet 500 mg  Status:  Discontinued     500 mg Oral Daily 06/14/17 0831 06/16/17 1356   06/11/17 1615  levofloxacin (LEVAQUIN) IVPB 500 mg  Status:  Discontinued     500 mg 100 mL/hr over 60 Minutes Intravenous Every 24 hours 06/11/17 1542 06/14/17 0831       Objective: Vitals:   06/17/17 0356 06/17/17 0500 06/17/17 0759 06/17/17 0901  BP: 96/63   97/72  Pulse: 76   (!) 103  Resp: 14     Temp: 97.8 F (36.6 C)   98.4 F (36.9 C)  TempSrc: Oral   Oral  SpO2: 100%  98% 96%  Weight:  40.8 kg (90 lb)    Height:       No intake or output data in the 24 hours ending 06/17/17 1217 Filed Weights   06/15/17 0500 06/16/17 0500 06/17/17 0500  Weight: 50.8 kg (112 lb) 41.2 kg (90 lb 12.8 oz) 40.8 kg (90 lb)    Examination: General exam:   - cachetic female sitting up in bed in no acute distress HEENT: PERRLA, oral mucosa moist, no sclera icterus or thrush Respiratory system: lungs are clear bilaterally- air movement poor.  Respiratory effort normal. Cardiovascular system: S1 & S2 heard, RRR.  No  murmurs  Gastrointestinal system: Abdomen soft, non-tender, nondistended. Normal bowel sound. No organomegaly Central nervous system: Alert and oriented. No focal neurological deficits. Extremities: No cyanosis, clubbing or edema Skin: No rashes or ulcers Psychiatry:  Mood & affect appropriate.     Data Reviewed: I have personally reviewed following labs and imaging studies  CBC:  Recent Labs Lab 06/11/17 1051 06/12/17 0156 06/13/17 0322 06/14/17 0539 06/15/17 0419  WBC 8.3 4.5 10.2 8.4 9.3  NEUTROABS 5.7  --   --   --   --   HGB 12.7 11.9* 10.2* 10.2* 10.6*  HCT 42.5 40.0 34.3* 35.8*  37.9  MCV 89.9 89.1 88.9 90.4 91.5  PLT 274 218 202 197 215   Basic Metabolic Panel:  Recent Labs Lab 06/11/17 2132  06/14/17 0539 06/15/17 0419 06/16/17 0426 06/16/17 1406 06/17/17 0257  NA  --   < > 139 141 139 135 139  K  --   < > 4.5 5.0 5.3* 4.5 4.0  CL  --   < > 97* 96* 92* 91* 91*  CO2  --   < > 38* 39* 43* 37* 41*  GLUCOSE  --   < > 118* 102* 154* 188* 85  BUN  --   < > 15 16 12 15 12   CREATININE  --   < > 0.46 0.46 0.47 0.48 0.41*  CALCIUM  --   < > 8.8* 9.0 8.7* 8.9 8.4*  MG 1.7  --   --   --   --   --   --   PHOS 3.9  --   --   --   --   --   --   < > = values in this interval not displayed. GFR: Estimated Creatinine Clearance: 40.3 mL/min (A) (by C-G formula based on SCr of 0.41 mg/dL (L)). Liver Function Tests:  Recent Labs Lab 06/12/17 0156  AST 26  ALT 19  ALKPHOS 34*  BILITOT 0.5  PROT 6.3*  ALBUMIN 3.8   No results for input(s): LIPASE, AMYLASE in the last 168 hours. No results for input(s): AMMONIA in the last 168 hours. Coagulation Profile: No results for input(s): INR, PROTIME in the last 168 hours. Cardiac Enzymes: No results for input(s): CKTOTAL, CKMB, CKMBINDEX, TROPONINI in the last 168 hours. BNP (last 3 results) No results for input(s): PROBNP in the last 8760 hours. HbA1C: No results for input(s): HGBA1C in the last 72 hours. CBG:  Recent Labs Lab 06/11/17 2030 06/11/17 2331 06/12/17 0321 06/12/17 0731 06/14/17 1544  GLUCAP 156* 146* 131* 129* 95   Lipid Profile: No results for input(s): CHOL, HDL, LDLCALC, TRIG, CHOLHDL, LDLDIRECT in the last 72 hours. Thyroid Function Tests: No results for input(s): TSH, T4TOTAL, FREET4, T3FREE, THYROIDAB in the last 72 hours. Anemia Panel: No results for input(s): VITAMINB12, FOLATE, FERRITIN, TIBC, IRON, RETICCTPCT in the last 72 hours. Urine analysis:    Component Value Date/Time   BILIRUBINUR neg 12/05/2012 1522   PROTEINUR neg 12/05/2012 1522   UROBILINOGEN 0.2 12/05/2012 1522     NITRITE neg 12/05/2012 1522   LEUKOCYTESUR Negative 12/05/2012 1522   Sepsis Labs: @LABRCNTIP (procalcitonin:4,lacticidven:4) ) Recent Results (from the past 240 hour(s))  MRSA PCR Screening     Status: None   Collection Time: 06/11/17  5:14 PM  Result Value Ref Range Status   MRSA by PCR NEGATIVE NEGATIVE Final    Comment:        The GeneXpert MRSA Assay (FDA approved for NASAL specimens  only), is one component of a comprehensive MRSA colonization surveillance program. It is not intended to diagnose MRSA infection nor to guide or monitor treatment for MRSA infections.          Radiology Studies: No results found.    Scheduled Meds: . amLODipine  2.5 mg Oral Daily  . arformoterol  15 mcg Nebulization BID  . aspirin  81 mg Oral Daily  . budesonide  0.5 mg Nebulization BID  . chlorpheniramine-HYDROcodone  5 mL Oral Q12H  . enoxaparin (LOVENOX) injection  40 mg Subcutaneous Q24H  . feeding supplement (ENSURE ENLIVE)  237 mL Oral TID BM  . fluticasone  2 spray Each Nare BID  . ipratropium-albuterol  3 mL Nebulization Q6H  . methylPREDNISolone (SOLU-MEDROL) injection  60 mg Intravenous Daily  . mirtazapine  7.5 mg Oral QHS  . pantoprazole  40 mg Oral BID  . sodium chloride flush  3 mL Intravenous Q12H   Continuous Infusions: . albuterol 10 mg/hr (06/11/17 1459)     LOS: 5 days    Time spent in minutes: 35    Calvert CantorSaima Andrya Roppolo, MD [facility] Pager: [email or web address] Password [email or web address] 06/17/2017, 12:17 PM

## 2017-06-17 NOTE — Consult Note (Signed)
Consultation Note Date: 06/17/2017   Patient Name: Anita Murphy  DOB: 04-29-1941  MRN: [id number]  Age / Sex: 76 y.o., female  PCP: Wardell Honour, MD Referring Physician: Debbe Odea, MD  Reason for Consultation: Establishing goals of care, Hospice Evaluation and Psychosocial/spiritual support  HPI/Patient Profile: 76 y.o. 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 CXR showed emphysema. .   Clinical Assessment and Goals of Care: Met with pt, and daughters Anita Murphy, and Anita Murphy. Pt asserts that she is "fine", walking down the hall but appears quite [facility] at rest during my assessment. Discussed disease trajectory with end stage COPD with associated severe pulmonary HTN. Daughter Anita Murphy stated, "I know she is dying" and she stated that pt did too. However, she continues to want "to fight". Explained in depth aggressive pathway in setting of respiratory failure including CPR, defibrillation, and intubation as well as likely inability to wean on her own if she were to become this sick. At that point she would be looking at a trach, could still be ventilatory dependnat and this could lead to living in a facility out of state, PEG as well as as impaired communication.  Then reviewed symptom based/comfort treatment trajectory with hospice. Pt has been under the care of hospice in the past.Provided education on the role of opioids in the management of COPD  At this point pt is still able to speak for herself but she has low health care literacy and difficulty grasping the everity of her illness. Her children would act as her healthcare proxy in the event she were unable to speak forherself    SUMMARY OF RECOMMENDATIONS   Full Code . This is at pt's request. I did  frankly discourage this given her frailty as well as severity of her underlying disease; the likelihood of coding pt being successful almost nonexistent. 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 Options are [facility] at [phone] or Palliative Care through [facility] [place] at [phone] Did educate pt and family that she does qualify for her hospice benefit Code Status/Advance Care Planning:  Full code    Symptom Management:   Continue targeted pulmonary tx: 02, nebs, steroids  Palliative Prophylaxis:   Aspiration, Bowel Regimen, Delirium Protocol, Eye Care, Oral Care and Turn Reposition  Additional Recommendations (Limitations, Scope, Preferences):  Full Scope Treatment  Psycho-social/Spiritual:   Desire for further Chaplaincy support:no  Additional Recommendations: Referral to Community Resources   Prognosis:   < 6 months  Discharge Planning: Home with home health with out-pt palliative medicine consult. See options under "Recommendation"     Primary Diagnoses: Present on Admission: . COPD exacerbation ([place]) . Acute on chronic respiratory failure with hypoxia ([place]) . HTN (hypertension) . Protein-calorie malnutrition, 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 . Moderate to severe pulmonary  hypertension ([place]) . Cor pulmonale (chronic) ([place]) . Left ventricular diastolic dysfunction   I have reviewed the medical record, interviewed the patient and family, and examined the patient. The following aspects are pertinent.  Past Medical History:  Diagnosis Date  . Allergic rhinitis, cause unspecified   . Chronic airway obstruction, not elsewhere classified   . Elevated blood pressure reading without diagnosis of hypertension   . Other (abnormal) findings on radiological examination of breast   . Other diseases of lung, not elsewhere classified   . Personal history of  tobacco use, presenting hazards to health    Social History   Social History  . Marital status: Married    Spouse name: N/A  . Number of children: 4  . Years of education: 12   Occupational History  . 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 Smoking status: Former Smoker    Packs/day: 1.00    Years: 30.00    Types: Cigarettes    Quit date: 12/19/2002  . Smokeless tobacco: Never Used  . Alcohol use No  . Drug use: No  . Sexual activity: No   Other Topics Concern  . None   Social History Narrative   Always uses seat belts. Smoke alarm and carbon monoxide detector in the home. Guns in the home stored in locked cabinet.       Marital status:  Widowed; married x 52 yrs, happy, no abuse.  Husband with lung cancer and colon cancer.      Lives with one daughter, brother, and grandson.       Exercise: none       Caffeine use: Coffee 2 servings per day.      Employment: retired      ADLs: independent with all ADLs.  No assistant devices for ambulation.  Washes clothes, cooks.        Advanced Directives:  None; DNR/DNI in 2016.   Family History  Problem Relation Age of Onset  . Lung disease Sister   . Hypothyroidism Mother   . Aortic stenosis Mother   . Dementia Mother   . COPD Sister        smoker  . Heart disease Father   . Mesothelioma Father        asbestos exp  . Cancer Father        LUNG   Scheduled Meds: . amLODipine  2.5 mg Oral Daily  . arformoterol  15 mcg Nebulization BID  . aspirin  81 mg Oral Daily  . budesonide  0.5 mg Nebulization BID  . chlorpheniramine-HYDROcodone  5 mL Oral Q12H  . enoxaparin (LOVENOX) injection  40 mg Subcutaneous Q24H  . feeding supplement (ENSURE ENLIVE)  237 mL Oral TID BM  . fluticasone  2 spray Each Nare BID  . ipratropium-albuterol  3 mL Nebulization Q6H  . mirtazapine  7.5 mg Oral QHS  . pantoprazole  40 mg Oral BID  . [START ON 06/18/2017] predniSONE  60 mg Oral Q breakfast  . sodium chloride  flush  3 mL Intravenous Q12H   Continuous Infusions: . albuterol 10 mg/hr (06/11/17 1459)   PRN Meds:.acetaminophen **OR** acetaminophen, ipratropium-albuterol, ondansetron **OR** ondansetron (ZOFRAN) IV, polyethylene glycol Medications Prior to Admission:  Prior to Admission medications   Medication Sig Start Date End Date Taking? Authorizing Provider  albuterol (PROVENTIL) (2.5 MG/3ML) 0.083% nebulizer solution Take 3 mLs (2.5 mg total) by nebulization every 4 (four) hours as  needed for wheezing or shortness of breath. 02/24/17  Yes Wardell Honour, MD  amLODipine (NORVASC) 5 MG tablet Take 0.5-1 tablets (2.5-5 mg total) by mouth daily. Patient taking differently: Take 2.5 mg by mouth daily.  03/18/17  Yes Tereasa Coop, PA-C  Ascorbic Acid (VITAMIN C) 1000 MG tablet Take 1,000 mg by mouth daily. Reported on 06/14/2016   Yes [provider]  aspirin 81 MG chewable tablet Chew 81 mg by mouth daily.   Yes [provider]  budesonide (PULMICORT) 0.25 MG/2ML nebulizer solution USE 1 VIAL VIA NEBULIZER TWICE DAILY 03/18/17  Yes Tereasa Coop, PA-C  feeding supplement, ENSURE ENLIVE, (ENSURE ENLIVE) LIQD Take 237 mLs by mouth 2 (two) times daily between meals. 03/18/17  Yes Tereasa Coop, PA-C  formoterol (PERFOROMIST) 20 MCG/2ML nebulizer solution USE 1 VIAL VIA NEBULIZER TWICE DAILY PERFECTLY REGULARLY 03/18/17  Yes Tereasa Coop, PA-C  hydrocortisone 2.5 % ointment Apply topically 2 (two) times daily. 11/29/16  Yes Wardell Honour, MD  ipratropium (ATROVENT) 0.02 % nebulizer solution Take 2.5 mLs (0.5 mg total) by nebulization every 6 (six) hours as needed for wheezing or shortness of breath. 03/18/17  Yes Tereasa Coop, PA-C  ketoconazole (NIZORAL) 2 % cream Apply 1 application topically 2 (two) times daily. 06/14/16  Yes Wardell Honour, MD  levalbuterol Penne Lash) 1.25 MG/3ML nebulizer solution Take 1.25 mg by nebulization every 4 (four) hours as needed for wheezing. DX:  J96.11; J44.9 03/18/17  Yes Tereasa Coop, PA-C  polyethylene glycol powder (GLYCOLAX/MIRALAX) powder MIX 17GM UTD AND TK  PO DAILY 03/18/17  Yes Tereasa Coop, PA-C  XOPENEX HFA 45 MCG/ACT inhaler INHALE 1 TO 2 PUFFS INTO THE LUNGS EVERY 4 HOURS AS NEEDED FOR WHEEZING 03/18/17  Yes Tereasa Coop, PA-C  mirtazapine (REMERON) 7.5 MG tablet Take 1 tablet (7.5 mg total) by mouth at bedtime. Patient not taking: Reported on 06/11/2017 03/18/17   Tereasa Coop, PA-C   No Known Allergies Review of Systems  Unable to perform ROS: Severe respiratory distress    Physical Exam  Constitutional: She is oriented to person, place, and time.  Cachetic older female with increased work of breathing  HENT:  Head: Normocephalic and atraumatic.  Neck: Normal range of motion.  Cardiovascular:  tachy  Pulmonary/Chest:  Increased work of breathing at rest  Musculoskeletal: Normal range of motion.  Neurological: She is alert and oriented to person, place, and time.  Skin: Skin is warm and dry.  Psychiatric:  anxious  Nursing note and vitals reviewed.   Vital Signs: BP 97/72 (BP Location: Right Arm)   Pulse (!) 103   Temp 98.4 F (36.9 C) (Oral)   Resp 14   Ht '5\' 2"'  (1.575 m)   Wt 40.8 kg (90 lb)   SpO2 96%   BMI 16.46 kg/m  Pain Assessment: No/denies pain   Pain Score: 0-No pain   SpO2: SpO2: 96 % O2 Device:SpO2: 96 % O2 Flow Rate: .O2 Flow Rate (L/min): 2 L/min  IO: Intake/output summary:  Intake/Output Summary (Last 24 hours) at 06/17/17 1433 Last data filed at 06/17/17 1300  Gross per 24 hour  Intake                0 ml  Output              400 ml  Net             -400 ml    LBM:  Last BM Date: 06/16/17 Baseline Weight: Weight: 38.1 kg (84 lb) Most recent weight: Weight: 40.8 kg (90 lb)     Palliative Assessment/Data:   Flowsheet Rows     Most Recent Value  Intake Tab  Referral Department  Hospitalist  Unit at Time of Referral  Intermediate Care Unit  Palliative  Care Primary Diagnosis  Pulmonary  Date Notified  06/16/17  Palliative Care Type  New Palliative care  Reason for referral  Clarify Goals of Care, Counsel Regarding Hospice, Psychosocial or Spiritual support  Date of Admission  06/11/17  Date first seen by Palliative Care  06/17/17  # of days Palliative referral response time  1 Day(s)  # of days IP prior to Palliative referral  5  Clinical Assessment  Palliative Performance Scale Score  30%  Pain Max last 24 hours  Not able to report  Pain Min Last 24 hours  Not able to report  Dyspnea Max Last 24 Hours  Not able to report  Dyspnea Min Last 24 hours  Not able to report  Nausea Max Last 24 Hours  Not able to report  Nausea Min Last 24 Hours  Not able to report  Anxiety Max Last 24 Hours  Not able to report  Anxiety Min Last 24 Hours  Not able to report  Other Max Last 24 Hours  Not able to report  Psychosocial & Spiritual Assessment  Palliative Care Outcomes  Patient/Family meeting held?  Yes  Who was at the meeting?  pt, daughters: GiGi, Kim, [place] to palliative care logitudinal support, Clarified goals of care  Palliative Care follow-up planned  No      Time In: 1300 Time Out: 1415 Time Total: 75 min Greater than 50%  of this time was spent counseling and coordinating care related to the above assessment and plan. Staffed with Dr. Wynelle Cleveland  Signed by: Dory Horn, NP   Please contact Palliative Medicine Team phone at [phone] for questions and concerns.  For individual provider: See Shea Evans

## 2017-06-18 DIAGNOSIS — Z7189 Other specified counseling: Secondary | ICD-10-CM

## 2017-06-18 DIAGNOSIS — Z515 Encounter for palliative care: Secondary | ICD-10-CM

## 2017-06-18 LAB — CREATININE, SERUM
CREATININE: 0.49 mg/dL (ref 0.44–1.00)
GFR calc Af Amer: >60 mL/min (ref >60)
GFR calc non Af Amer: >60 mL/min (ref >60)

## 2017-06-18 MED ORDER — MORPHINE SULFATE (CONCENTRATE) 10 MG/0.5ML PO SOLN
5.0000 mg | ORAL | Status: DC | PRN
  Administered 2017-06-18 – 2017-06-19 (×4): 5 mg via ORAL
  Filled 2017-06-18 (×4): qty 0.5

## 2017-06-18 MED ORDER — IPRATROPIUM-ALBUTEROL 0.5-2.5 (3) MG/3ML IN SOLN
3.0000 mL | Freq: Three times a day (TID) | RESPIRATORY_TRACT | Status: DC
  Administered 2017-06-18 – 2017-06-19 (×5): 3 mL via RESPIRATORY_TRACT
  Filled 2017-06-18 (×6): qty 3

## 2017-06-18 MED ORDER — GUAIFENESIN 100 MG/5ML PO SOLN
10.0000 mL | Freq: Three times a day (TID) | ORAL | Status: DC
  Administered 2017-06-18 – 2017-06-19 (×3): 200 mg via ORAL
  Filled 2017-06-18 (×3): qty 10

## 2017-06-18 MED ORDER — GUAIFENESIN ER 600 MG PO TB12: 600.0000 mg | ORAL_TABLET | Freq: Two times a day (BID) | ORAL | Status: DC

## 2017-06-18 NOTE — Progress Notes (Signed)
PROGRESS NOTE    Anita Murphy   WUJ:[id number]  DOB: [birth year]  DOA: 06/11/2017 PCP: [name], MD   Brief Narrative:  Patient is a 76 year old female with hypertension, tobacco abuse, O2 dependent COPD, severe pulmonary hypertension, presented with shortness of breath for 2 days. She has a mild increase in cough and diffuse chest pain when she coughs. No fever or chills.  In ED, patient was noticed to be tachypneic with increased work of breathing and was placed on BiPAP. Chest x-ray showed COPD with emphysema.   Subjective: Shortness of breath with walking today. RN feels that she is having a panic attack. The patient tells me clearly that she is not anxious.  No nausea, vomiting, constipation, diarrhea, dysuria or chest pain today.    Assessment & Plan:   Principal Problem:   Acute on chronic respiratory failure with hypoxia / COPD exacerbation  - frail patient with weight loss and severe COPD- have reinforced that she should be a DNR- she is not yet ready to make a decision on this- palliative care spoke with her and family about GOC on 6/30- she decided to remain a full code - being treated with steroids, Nebs, Mucinex, Levaquin (course completed) and aggressive pulmonary toilet - BiPAP as needed - Pulmonary assisting with management- weaning steroids -last dose of Solumedrol was yesterday - started Prednisone today  -I have discussed palliative care with patient and daughter again today as she continues to be hypoxic and short of breath with activity-  have spoken with palliative care again today and asked them to speak with her today again    Active Problems:     Protein-calorie malnutrition, 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 - cont protein supplements - she has been taking her "appetite pills" only intermittently and not as prescribed- she states her appetite is improved and she has gained some weight     Moderate to severe pulmonary hypertension   Cor pulmonale  (chronic)     Left ventricular diastolic dysfunction - - 2-D echo 2017 showed EF of 60-65% with grade 1 diastolic dysfunction, severe pulmonary hypertension, PA pressure 82    HTN (hypertension) - cont Norvasc  AOCD - stable   DVT prophylaxis: Lovenox Code Status: Full code Family Communication: daughter   Disposition Plan: home when stable Consultants:   PCCM Procedures:    Antimicrobials:  Anti-infectives    Start     Dose/Rate Route Frequency Ordered Stop   06/14/17 1600  levofloxacin (LEVAQUIN) tablet 500 mg  Status:  Discontinued     500 mg Oral Daily 06/14/17 0831 06/16/17 1356   06/11/17 1615  levofloxacin (LEVAQUIN) IVPB 500 mg  Status:  Discontinued     500 mg 100 mL/hr over 60 Minutes Intravenous Every 24 hours 06/11/17 1542 06/14/17 0831       Objective: Vitals:   06/18/17 0416 06/18/17 0740 06/18/17 1043 06/18/17 1148  BP:  114/78 130/74 106/66  Pulse:      Resp:      Temp:  98.2 F (36.8 C)  98.3 F (36.8 C)  TempSrc:  Oral  Oral  SpO2:      Weight: 41.5 kg (91 lb 9.6 oz)     Height:        Intake/Output Summary (Last 24 hours) at 06/18/17 1536 Last data filed at 06/18/17 1215  Gross per 24 hour  Intake                0 ml  Output  1300 ml  Net            -1300 ml   Filed Weights   06/16/17 0500 06/17/17 0500 06/18/17 0416  Weight: 41.2 kg (90 lb 12.8 oz) 40.8 kg (90 lb) 41.5 kg (91 lb 9.6 oz)    Examination: General exam:   - cachetic female sitting up in bed in no acute distress HEENT: PERRLA, oral mucosa moist, no sclera icterus or thrush Respiratory system: lungs are clear bilaterally- air movement poor. Is quite short of breath right now after attempting to walk to the nursing station- pulse ox 97% on RA Cardiovascular system: S1 & S2 heard, RRR.  No murmurs  Gastrointestinal system: Abdomen soft, non-tender, nondistended. Normal bowel sound. No organomegaly Central nervous system: Alert and oriented. No focal neurological  deficits. Extremities: No cyanosis, clubbing or edema Skin: No rashes or ulcers Psychiatry:  Mood & affect appropriate.     Data Reviewed: I have personally reviewed following labs and imaging studies  CBC:  Recent Labs Lab 06/12/17 0156 06/13/17 0322 06/14/17 0539 06/15/17 0419  WBC 4.5 10.2 8.4 9.3  HGB 11.9* 10.2* 10.2* 10.6*  HCT 40.0 34.3* 35.8* 37.9  MCV 89.1 88.9 90.4 91.5  PLT 218 202 197 215   Basic Metabolic Panel:  Recent Labs Lab 06/11/17 2132  06/14/17 0539 06/15/17 0419 06/16/17 0426 06/16/17 1406 06/17/17 0257 06/18/17 0516  NA  --   < > 139 141 139 135 139  --   K  --   < > 4.5 5.0 5.3* 4.5 4.0  --   CL  --   < > 97* 96* 92* 91* 91*  --   CO2  --   < > 38* 39* 43* 37* 41*  --   GLUCOSE  --   < > 118* 102* 154* 188* 85  --   BUN  --   < > 15 16 12 15 12   --   CREATININE  --   < > 0.46 0.46 0.47 0.48 0.41* 0.49  CALCIUM  --   < > 8.8* 9.0 8.7* 8.9 8.4*  --   MG 1.7  --   --   --   --   --   --   --   PHOS 3.9  --   --   --   --   --   --   --   < > = values in this interval not displayed. GFR: Estimated Creatinine Clearance: 41 mL/min (by C-G formula based on SCr of 0.49 mg/dL). Liver Function Tests:  Recent Labs Lab 06/12/17 0156  AST 26  ALT 19  ALKPHOS 34*  BILITOT 0.5  PROT 6.3*  ALBUMIN 3.8   No results for input(s): LIPASE, AMYLASE in the last 168 hours. No results for input(s): AMMONIA in the last 168 hours. Coagulation Profile: No results for input(s): INR, PROTIME in the last 168 hours. Cardiac Enzymes: No results for input(s): CKTOTAL, CKMB, CKMBINDEX, TROPONINI in the last 168 hours. BNP (last 3 results) No results for input(s): PROBNP in the last 8760 hours. HbA1C: No results for input(s): HGBA1C in the last 72 hours. CBG:  Recent Labs Lab 06/11/17 2030 06/11/17 2331 06/12/17 0321 06/12/17 0731 06/14/17 1544  GLUCAP 156* 146* 131* 129* 95   Lipid Profile: No results for input(s): CHOL, HDL, LDLCALC, TRIG,  CHOLHDL, LDLDIRECT in the last 72 hours. Thyroid Function Tests: No results for input(s): TSH, T4TOTAL, FREET4, T3FREE, THYROIDAB in the last 72 hours. Anemia  Panel: No results for input(s): VITAMINB12, FOLATE, FERRITIN, TIBC, IRON, RETICCTPCT in the last 72 hours. Urine analysis:    Component Value Date/Time   BILIRUBINUR neg 12/05/2012 1522   PROTEINUR neg 12/05/2012 1522   UROBILINOGEN 0.2 12/05/2012 1522   NITRITE neg 12/05/2012 1522   LEUKOCYTESUR Negative 12/05/2012 1522   Sepsis Labs: @LABRCNTIP (procalcitonin:4,lacticidven:4) ) Recent Results (from the past 240 hour(s))  MRSA PCR Screening     Status: None   Collection Time: 06/11/17  5:14 PM  Result Value Ref Range Status   MRSA by PCR NEGATIVE NEGATIVE Final    Comment:        The GeneXpert MRSA Assay (FDA approved for NASAL specimens only), is one component of a comprehensive MRSA colonization surveillance program. It is not intended to diagnose MRSA infection nor to guide or monitor treatment for MRSA infections.          Radiology Studies: No results found.    Scheduled Meds: . amLODipine  2.5 mg Oral Daily  . arformoterol  15 mcg Nebulization BID  . aspirin  81 mg Oral Daily  . budesonide  0.5 mg Nebulization BID  . chlorpheniramine-HYDROcodone  5 mL Oral Q12H  . enoxaparin (LOVENOX) injection  40 mg Subcutaneous Q24H  . feeding supplement (ENSURE ENLIVE)  237 mL Oral TID BM  . fluticasone  2 spray Each Nare BID  . guaiFENesin  10 mL Oral TID  . ipratropium-albuterol  3 mL Nebulization TID  . mirtazapine  7.5 mg Oral QHS  . pantoprazole  40 mg Oral BID  . predniSONE  60 mg Oral Q breakfast  . sodium chloride flush  3 mL Intravenous Q12H   Continuous Infusions: . albuterol 10 mg/hr (06/11/17 1459)     LOS: 6 days    Time spent in minutes: 35    Calvert CantorSaima Kenzlei Runions, MD [facility] Pager: [email or web address] Password [email or web address] 06/18/2017, 3:36 PM

## 2017-06-18 NOTE — Progress Notes (Signed)
Patient ambulated about 30 ft in hallway. Patient on 3 liters of oxygen, and O2 sats dropped to 60%. Patient was quickly returned to her room where she sat on the side of the bed and struggled to catch her breath. After several moments of pursed lipped breathing and sitting in the tripod position, the patient's O2 saturation resolved without additional interventions. Thus, patient tolerated ambulation poorly. Patient resting comfortably in bed, without any discomfort. Will continue to monitor.

## 2017-06-18 NOTE — Progress Notes (Addendum)
Daily Progress Note   Patient Name: Anita Murphy       Date: 06/18/2017 DOB: 1941-02-07  Age: 76 y.o. MRN#: [id number] Attending Physician: Anita Odea, MD Primary Care Physician: Anita Honour, MD Admit Date: 06/11/2017  Reason for Consultation/Follow-up: Establishing goals of care, Hospice Evaluation and Psychosocial/spiritual support  Subjective: Anita Murphy is very SOB, but struggles to tell us "I'm okay." Difficult for her to qualify the severity of her SOB as I feel this is most likely close to her pre-admission baseline.   Length of Stay: 6  Current Medications: Scheduled Meds:  . amLODipine  2.5 mg Oral Daily  . arformoterol  15 mcg Nebulization BID  . aspirin  81 mg Oral Daily  . budesonide  0.5 mg Nebulization BID  . chlorpheniramine-HYDROcodone  5 mL Oral Q12H  . enoxaparin (LOVENOX) injection  40 mg Subcutaneous Q24H  . feeding supplement (ENSURE ENLIVE)  237 mL Oral TID BM  . fluticasone  2 spray Each Nare BID  . ipratropium-albuterol  3 mL Nebulization TID  . mirtazapine  7.5 mg Oral QHS  . pantoprazole  40 mg Oral BID  . predniSONE  60 mg Oral Q breakfast  . sodium chloride flush  3 mL Intravenous Q12H    Continuous Infusions: . albuterol 10 mg/hr (06/11/17 1459)    PRN Meds: acetaminophen **OR** acetaminophen, ipratropium-albuterol, ondansetron **OR** ondansetron (ZOFRAN) IV, polyethylene glycol  Physical Exam  Constitutional: She is oriented to person, place, and time. She appears cachectic. She appears ill. She appears distressed.  Cardiovascular: Tachycardia present.   Pulmonary/Chest: Accessory muscle usage present. Tachypnea noted. She is in respiratory distress. She has decreased breath sounds.  Abdominal: Normal appearance.  Neurological: She is  alert and oriented to person, place, and time.  Psychiatric: Her mood appears anxious.  Nursing note and vitals reviewed.           Vital Signs: BP 106/66 (BP Location: Right Arm)   Pulse 72   Temp 98.3 F (36.8 C) (Oral)   Resp (!) 21   Ht '5\' 2"'  (1.575 m)   Wt 41.5 kg (91 lb 9.6 oz)   SpO2 100%   BMI 16.75 kg/m  SpO2: SpO2: 100 % O2 Device: O2 Device: Nasal Cannula O2 Flow Rate: O2 Flow Rate (L/min): 2 L/min  Intake/output summary:  Intake/Output Summary (Last 24 hours) at 06/18/17 1456 Last data filed at 06/18/17 1215  Gross per 24 hour  Intake                0 ml  Output             1300 ml  Net            -1300 ml   LBM: Last BM Date: 06/17/17 Baseline Weight: Weight: 38.1 kg (84 lb) Most recent weight: Weight: 41.5 kg (91 lb 9.6 oz)       Palliative Assessment/Data:    Flowsheet Rows     Most Recent Value  Intake Tab  Referral Department  Hospitalist  Unit at Time of Referral  Intermediate Care Unit  Palliative Care Primary Diagnosis  Pulmonary  Date Notified  06/16/17  Palliative Care Type  New Palliative care  Reason for referral  Clarify Goals of Care, Counsel Regarding Hospice, Psychosocial or Spiritual support  Date of Admission  06/11/17  Date first seen by Palliative Care  06/17/17  # of days Palliative referral response time  1 Day(s)  # of days IP prior to Palliative referral  5  Clinical Assessment  Palliative Performance Scale Score  30%  Pain Max last 24 hours  Not able to report  Pain Min Last 24 hours  Not able to report  Dyspnea Max Last 24 Hours  Not able to report  Dyspnea Min Last 24 hours  Not able to report  Nausea Max Last 24 Hours  Not able to report  Nausea Min Last 24 Hours  Not able to report  Anxiety Max Last 24 Hours  Not able to report  Anxiety Min Last 24 Hours  Not able to report  Other Max Last 24 Hours  Not able to report  Psychosocial & Spiritual Assessment  Palliative Care Outcomes  Patient/Family meeting held?  Yes    Who was at the meeting?  pt, daughters: GiGi, Kim, Seibert to palliative care logitudinal support, Clarified goals of care  Palliative Care follow-up planned  No      Patient Active Problem List   Diagnosis Date Noted  . Palliative care by specialist   . Acute on chronic respiratory failure with hypoxia ([place]) 06/11/2017  . HTN (hypertension) 06/11/2017  . 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 Moderate to severe pulmonary hypertension ([place]) 06/11/2017  . Cor pulmonale (chronic) ([place]) 06/11/2017  . Left ventricular diastolic dysfunction 84/66/5993  . CAP (community acquired pneumonia) 01/11/2016  . Acute and chronic respiratory failure (acute-on-chronic) ([place]) 01/11/2016  . Chronic respiratory failure with hypoxia and hypercapnia ([place]) 09/02/2015  . COPD exacerbation ([place]) 07/22/2015  . Severe protein-calorie malnutrition ([place]) 07/22/2015  . Elevated blood pressure 07/22/2015  . Routine general medical examination at a health care facility 12/07/2012  . Need for prophylactic vaccination and inoculation against influenza 12/07/2012  . Routine gynecological examination 12/07/2012  . COPD GOLD III criteria/ 02 dep  12/07/2012  . Weight loss 12/07/2012  . Stress reaction 12/07/2012  . Hordeolum 12/07/2012    Palliative Care Assessment & Plan   HPI: 76 y.o. 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 CXR showed emphysema. Anita Murphy continues to be severely SOB and hypoxic with just basic activity.   Assessment: I  met today first with Anita Murphy and daughter, Anita Murphy. Anita Murphy had an episode where she was almost in a panicked state for a few minutes until she was able to cough up a small amount of thick green mucous. She was hypoxic and lips cyanotic. She was  exhausted after this event. I have added scheduled guaifenesin.   I came back and spoke more with Anita Murphy and Anita Murphy and we were joined by daughters Anita Murphy and Anita Murphy as well. We discussed more that Anita Murphy has been maximized for her COPD - PCCM did not believe she had infection although she was on antibiotics. She has also been receiving steroids, nebs, BiPAP. Continues to desat with brief ambulation and per RN sat only up to low 80s with 8L oxygen while walking.   We discussed plan for home and they agree with hospice for assistance with end stage COPD management for symptom burden and continued GOC. Hospice to help manage Roxanol for SOB - family will need much assistance with knowing how to utilize and titrate. Very clear the goal with Roxanol is for comfort and does not reverse any lung disease/damage.   We also discussed code status further and why this would cause more harm than benefit and we strongly recommend against this for Ms. Cirrincione in the best interest in not harming her. We also further discussed trach and what this path would bring (PEG, infections, bedsores, placement issues, and VERY POOR QOL with multiple complications). Family seemed to understand and I reiterated that we want Ms. Roseman to live as long as she can but we also do not want to do things to her that will cause suffering - family seems to understand. Ms. Hockley tells [place] that she would like more time but if she does not have improvement in the next few days she will consider DNR.   Emotional support provided.   Recommendations/Plan:  Trial of Roxanol 5 mg every 4 hours prn.   Home with hospice  Hospice to continue discussions regarding aggressiveness of care and code status  Goals of Care and Additional Recommendations:  Limitations on Scope of Treatment: Considering code status  Code Status:  Full code for now  Prognosis:   < 6 months  Discharge Planning:  Home with Hospice, lives with daughter  [facility] plan was discussed with Dr. Wynelle [place], RN Wilhemena Durie, patient, 3 daughters  Thank you for allowing the Palliative Medicine Team to assist in the care of this patient.   Total Time 20mn Prolonged Time Billed  no       Greater than 50%  of this time was spent counseling and coordinating care related to the above assessment and plan.  AVinie Sill NP Palliative Medicine Team Pager # [phone](M-F 8a-5p) Team Phone # [phone](Nights/Weekends)

## 2017-06-19 MED ORDER — POLYETHYLENE GLYCOL 3350 17 G PO PACK
17.0000 g | PACK | Freq: Once | ORAL | Status: AC
Start: 2017-06-19 — End: 2017-06-19
  Administered 2017-06-19: 17 g via ORAL
  Filled 2017-06-19: qty 1

## 2017-06-19 MED ORDER — DM-GUAIFENESIN ER 30-600 MG PO TB12: 1.0000 | ORAL_TABLET | Freq: Two times a day (BID) | ORAL | 0 refills | Status: AC | PRN

## 2017-06-19 MED ORDER — PREDNISONE 20 MG PO TABS: 60.0000 mg | ORAL_TABLET | Freq: Every day | ORAL | 0 refills | Status: AC

## 2017-06-19 MED ORDER — MORPHINE SULFATE (CONCENTRATE) 10 MG/0.5ML PO SOLN: 5.0000 mg | ORAL | 0 refills | Status: AC | PRN

## 2017-06-19 MED ORDER — HYDROCOD POLST-CPM POLST ER 10-8 MG/5ML PO SUER: 5.0000 mL | Freq: Two times a day (BID) | ORAL | 0 refills | Status: AC

## 2017-06-19 MED ORDER — PANTOPRAZOLE SODIUM 40 MG PO TBEC: 40.0000 mg | DELAYED_RELEASE_TABLET | Freq: Every day | ORAL | 0 refills | Status: AC

## 2017-06-19 NOTE — Discharge Instructions (Signed)
Please turn your O2 up to 6 L before you walk.   Please take all your medications with you for your next visit with your Primary MD. Please request your Primary MD to go over all hospital test results at the follow up. Please ask your Primary MD to get all Hospital records sent to his/her office.  If you experience worsening of your admission symptoms, develop shortness of breath, chest pain, suicidal or homicidal thoughts or a life threatening emergency, you must seek medical attention immediately by calling 911 or calling your MD.  Anita QuinYou must read the complete instructions/literature along with all the possible adverse reactions/side effects for all the medicines you take including new medications that have been prescribed to you. Take new medicines after you have completely understood and accpet all the possible adverse reactions/side effects.   Do not drive when taking pain medications or sedatives.    Do not take more than prescribed Pain, Sleep and Anxiety Medications  If you have smoked or chewed Tobacco in the last 2 yrs please stop. Stop any regular alcohol and or recreational drug use.  Wear Seat belts while driving.

## 2017-06-19 NOTE — Discharge Summary (Signed)
Physician Discharge Summary  Anita Murphy ZOX:[id number] DOB: 1941/10/23 DOA: 06/11/2017  PCP: [name], MD  Admit date: 06/11/2017 Discharge date: 06/19/2017  Admitted From: home Disposition:  home   Recommendations for Outpatient Follow-up:  1. Hospice/ palliative care to follow at home 2. 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 Breathing is unchanged from yesterday- stable at rest and is exacerbated by ambulating. Cough is mild today. No chest pain. I asked her about anxiety yesterday and again today which she continues to deny. Daughter and RN note anticipatory anxiety in relation to having to walk. She used s/o Morphine this morning when she was short of breath. Not sure if it helped her enough.   Brief Summary: Patient is a 76 year old female with hypertension, tobacco abuse, O2 dependent COPD, severe pulmonary hypertension, presented from home with shortness of breath for 2 days. She has a mild increase in cough and diffuse chest pain when she coughs. No fever or chills.  In ED, patient was noticed to be tachypneic with increased work of breathing and was placed on BiPAP. Chest x-ray showed COPD with emphysema.    Hospital Course:  Principal Problem:   Acute on chronic respiratory failure with hypoxia / COPD exacerbation  - frail patient with weight loss and severe COPD-- I have reinforced that she should be a DNR-  she is not yet ready to make a decision on this- palliative care spoke with her and family about GOC on 6/30- she decided to remain a full code and declined palliative care services. - being treated with steroids, Nebs, Mucinex, Levaquin (course completed) and aggressive pulmonary toilet - BiPAP was being used PRN - Pulmonary assisting with management- weaning steroids -last dose of Solumedrol was 2 days ago when she was transitioned to  Prednisone  - 6/1- I have discussed palliative care with patient and daughter again today as she continues to be hypoxic and short of breath with activity-  They have accepted home hospice services but would like her to remain an full code. 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 - cont protein supplements - she has been taking her "appetite pills" only intermittently and not as prescribed- she states her appetite is improved     Moderate to severe pulmonary hypertension   Cor pulmonale (chronic)     Left ventricular diastolic dysfunction - - 2-D echo 2017 showed EF of 60-65% with grade 1 diastolic dysfunction, severe pulmonary hypertension, PA pressure82    HTN (hypertension) - cont Norvasc  AOCD - stable   Discharge Instructions  Discharge Instructions  Diet general    Complete by:  As directed    Regular diet   Increase activity slowly    Complete by:  As directed      Allergies as of 06/19/2017   No Known Allergies     Medication List    TAKE these medications   albuterol (2.5 MG/3ML) 0.083% nebulizer solution Commonly known as:  PROVENTIL Take 3 mLs (2.5 mg total) by nebulization every 4 (four) hours as  needed for wheezing or shortness of breath.   amLODipine 5 MG tablet Commonly known as:  NORVASC Take 0.5-1 tablets (2.5-5 mg total) by mouth daily. What changed:  how much to take   aspirin 81 MG chewable tablet Chew 81 mg by mouth daily.   budesonide 0.25 MG/2ML nebulizer solution Commonly known as:  PULMICORT USE 1 VIAL VIA NEBULIZER TWICE DAILY   chlorpheniramine-HYDROcodone 10-8 MG/5ML Suer Commonly known as:  TUSSIONEX Take 5 mLs by mouth every 12 (twelve) hours.   dextromethorphan-guaiFENesin 30-600 MG 12hr tablet Commonly known as:  MUCINEX DM Take 1 tablet by mouth 2 (two) times daily as needed for cough.   feeding supplement (ENSURE ENLIVE) Liqd Take 237 mLs by mouth 2 (two) times daily between meals.   formoterol 20 MCG/2ML nebulizer solution Commonly known as:  PERFOROMIST USE 1 VIAL VIA NEBULIZER TWICE DAILY PERFECTLY REGULARLY   hydrocortisone 2.5 % ointment Apply topically 2 (two) times daily.   ipratropium 0.02 % nebulizer solution Commonly known as:  ATROVENT Take 2.5 mLs (0.5 mg total) by nebulization every 6 (six) hours as needed for wheezing or shortness of breath.   ketoconazole 2 % cream Commonly known as:  NIZORAL Apply 1 application topically 2 (two) times daily.   levalbuterol 1.25 MG/3ML nebulizer solution Commonly known as:  XOPENEX Take 1.25 mg by nebulization every 4 (four) hours as needed for wheezing. DX: J96.11; J44.9   mirtazapine 7.5 MG tablet Commonly known as:  REMERON Take 1 tablet (7.5 mg total) by mouth at bedtime.   morphine CONCENTRATE 10 MG/0.5ML Soln concentrated solution Take 0.25 mLs (5 mg total) by mouth every 4 (four) hours as needed for shortness of breath.   pantoprazole 40 MG tablet Commonly known as:  PROTONIX Take 1 tablet (40 mg total) by mouth daily.   polyethylene glycol powder powder Commonly known as:  GLYCOLAX/MIRALAX MIX 17GM UTD AND TK  PO DAILY   predniSONE 20 MG tablet Commonly known as:   DELTASONE Take 3 tablets (60 mg total) by mouth daily with breakfast. 60 mg x 2 days, 50 mg x 2 days, 40 mg x 2 days, 30 mg x 2 days, 20 mg x 2 days, 10 mg x 2 days, 5 mg x 2 days   vitamin C 1000 MG tablet Take 1,000 mg by mouth daily. Reported on 06/14/2016   XOPENEX HFA 45 MCG/ACT inhaler Generic drug:  levalbuterol INHALE 1 TO 2 PUFFS INTO THE LUNGS EVERY 4 HOURS AS NEEDED FOR WHEEZING      Follow-up Information    Nyoka CowdenWert, Michael B, MD Follow up on 06/20/2017.   Specialty:  Pulmonary Disease Why:  as soon as possible-  Appointment: June 20, 2017 @ 4pm Contact information: [address] [place] [address] [phone]          No Known Allergies   Procedures/Studies:    Dg Chest Port 1 View  Result Date: 06/14/2017 CLINICAL DATA:  COPD, followup EXAM: PORTABLE CHEST 1 VIEW COMPARISON:  06/11/2017 FINDINGS: Normal heart  size, mediastinal contours, and pulmonary vascularity. Minimal atherosclerotic calcification aorta. Severe emphysematous changes consistent with COPD. No acute infiltrate, pleural effusion or pneumothorax. Bones demineralized. IMPRESSION: Severe COPD changes without acute infiltrate. Aortic Atherosclerosis (ICD10-I70.0) and Emphysema (ICD10-J43.9). Electronically Signed   By: Ulyses Southward M.D.   On: 06/14/2017 15:14   Dg Chest Port 1 View  Result Date: 06/11/2017 CLINICAL DATA:  Pt c/o SOB For 4-5 days. Pt denies CP at this time. Hx COPD, former smoker. EXAM: PORTABLE CHEST 1 VIEW COMPARISON:  Chest x-ray dated 01/18/2016. FINDINGS: Heart size and mediastinal contours are stable. Atherosclerotic changes noted at the aortic arch. Lungs are hyperexpanded. No new airspace opacity to suggest a developing pneumonia. No pleural effusion or pneumothorax seen. No acute or suspicious osseous finding. IMPRESSION: 1. No active disease.  No evidence of pneumonia or pulmonary edema. 2. Hyperexpanded lungs indicating COPD/emphysema. Suspect associated chronic bronchitic  changes centrally. 3. Aortic atherosclerosis. Electronically Signed   By: Bary Richard M.D.   On: 06/11/2017 12:06       Discharge Exam: Vitals:   06/19/17 0740 06/19/17 1136  BP: (!) 142/108 (!) 111/97  Pulse:    Resp:    Temp: 97.2 F (36.2 C) 97.9 F (36.6 C)   Vitals:   06/19/17 0740 06/19/17 1136 06/19/17 1214 06/19/17 1432  BP: (!) 142/108 (!) 111/97    Pulse:      Resp:      Temp: 97.2 F (36.2 C) 97.9 F (36.6 C)    TempSrc: Axillary Oral    SpO2:   96% 94%  Weight:      Height:        General: Pt is alert, awake, not in acute distress Cardiovascular: RRR, S1/S2 +, no rubs, no gallops Respiratory: CTA bilaterally, no wheezing, no rhonchi Abdominal: Soft, NT, ND, bowel sounds + Extremities: no edema, no cyanosis    The results of significant diagnostics from this hospitalization (including imaging, microbiology, ancillary and laboratory) are listed below for reference.     Microbiology: Recent Results (from the past 240 hour(s))  MRSA PCR Screening     Status: None   Collection Time: 06/11/17  5:14 PM  Result Value Ref Range Status   MRSA by PCR NEGATIVE NEGATIVE Final    Comment:        The GeneXpert MRSA Assay (FDA approved for NASAL specimens only), is one component of a comprehensive MRSA colonization surveillance program. It is not intended to diagnose MRSA infection nor to guide or monitor treatment for MRSA infections.      Labs: BNP (last 3 results) No results for input(s): BNP in the last 8760 hours. Basic Metabolic Panel:  Recent Labs Lab 06/14/17 0539 06/15/17 0419 06/16/17 0426 06/16/17 1406 06/17/17 0257 06/18/17 0516  NA 139 141 139 135 139  --   K 4.5 5.0 5.3* 4.5 4.0  --   CL 97* 96* 92* 91* 91*  --   CO2 38* 39* 43* 37* 41*  --   GLUCOSE 118* 102* 154* 188* 85  --   BUN 15 16 12 15 12   --   CREATININE 0.46 0.46 0.47 0.48 0.41* 0.49  CALCIUM 8.8* 9.0 8.7* 8.9 8.4*  --    Liver Function Tests: No results for  input(s): AST, ALT, ALKPHOS, BILITOT, PROT, ALBUMIN in the last 168 hours. No results for input(s): LIPASE, AMYLASE in the last 168 hours. No results for input(s): AMMONIA in the last 168 hours. CBC:  Recent Labs Lab 06/13/17  1610 06/14/17 0539 06/15/17 0419  WBC 10.2 8.4 9.3  HGB 10.2* 10.2* 10.6*  HCT 34.3* 35.8* 37.9  MCV 88.9 90.4 91.5  PLT 202 197 215   Cardiac Enzymes: No results for input(s): CKTOTAL, CKMB, CKMBINDEX, TROPONINI in the last 168 hours. BNP: Invalid input(s): POCBNP CBG:  Recent Labs Lab 06/14/17 1544  GLUCAP 95   D-Dimer No results for input(s): DDIMER in the last 72 hours. Hgb A1c No results for input(s): HGBA1C in the last 72 hours. Lipid Profile No results for input(s): CHOL, HDL, LDLCALC, TRIG, CHOLHDL, LDLDIRECT in the last 72 hours. Thyroid function studies No results for input(s): TSH, T4TOTAL, T3FREE, THYROIDAB in the last 72 hours.  Invalid input(s): FREET3 Anemia work up No results for input(s): VITAMINB12, FOLATE, FERRITIN, TIBC, IRON, RETICCTPCT in the last 72 hours. Urinalysis    Component Value Date/Time   BILIRUBINUR neg 12/05/2012 1522   PROTEINUR neg 12/05/2012 1522   UROBILINOGEN 0.2 12/05/2012 1522   NITRITE neg 12/05/2012 1522   LEUKOCYTESUR Negative 12/05/2012 1522   Sepsis Labs Invalid input(s): PROCALCITONIN,  WBC,  LACTICIDVEN Microbiology Recent Results (from the past 240 hour(s))  MRSA PCR Screening     Status: None   Collection Time: 06/11/17  5:14 PM  Result Value Ref Range Status   MRSA by PCR NEGATIVE NEGATIVE Final    Comment:        The GeneXpert MRSA Assay (FDA approved for NASAL specimens only), is one component of a comprehensive MRSA colonization surveillance program. It is not intended to diagnose MRSA infection nor to guide or monitor treatment for MRSA infections.      Time coordinating discharge: Over 30 minutes  SIGNED:   Calvert Cantor, MD  [facility] 06/19/2017, 3:24  PM Pager   If 7PM-7AM, please contact night-coverage [email or web address] Password [email or web address]

## 2017-06-19 NOTE — Progress Notes (Signed)
Discharge Note:  Patient alert and oriented X 4 and in no distress. Patient and daughter given discharge instructions regarding signs and symptoms to report, medication, diet, activity, and upcoming appointments.  They verbalized understanding of all instructions.  Telemetry and peripheral IV discontinued. Patient confirmed that she had all of her belongings.  Patient's family brought her home oxygen tank and she was placed on 3L/min of oxygen via nasal cannula.  She was transported out via wheelchair by NT.

## 2017-06-19 NOTE — Progress Notes (Signed)
Nutrition Follow-up  DOCUMENTATION CODES:   Severe malnutrition in context of chronic illness  INTERVENTION:  Recommend continuation of Ensure at home post discharge for adequate nutrition.   Encourage PO intake.   NUTRITION DIAGNOSIS:   Malnutrition (severe) related to chronic illness (end-stage COPD) as evidenced by severe depletion of body fat, severe depletion of muscle mass; ongoing  GOAL:   Patient will meet greater than or equal to 90% of their needs; met  MONITOR:   PO intake, Supplement acceptance, Labs, Weight trends, Skin, I & O's  REASON FOR ASSESSMENT:   Consult Assessment of nutrition requirement/status  ASSESSMENT:   "76 y.o. Female with PMH significant for HTN, history of tobacco abuse and O2 dependent COPD. Patient reports 2 days of shortness of breath not associated with fevers, chills or congestion. In ER patient's O2 requirement has increased from 2 to 3 L and she is maintaining O2 saturations between 98 and 100% but this is noted to be in the setting of increased work of breathing and tachypnea. She also has extremely poor air movement with respiratory effort. Chest x-ray was unremarkable." Copied from Internal Medicine note, Samella Parr, NP, 06/11/17.  Meal completion has been 50%. Pt reports appetite is fine with no other difficulties. Pt currently has Ensure ordered and has been consuming them. Plans for discharge today per pt. Pt recommended to continue with nutritional supplements post discharge for adequate nutrition.   Labs and medications reviewed.   Diet Order:  Diet Heart Room service appropriate? Yes; Fluid consistency: Thin Diet general  Skin:  Reviewed, no issues  Last BM:  7/1  Height:   Ht Readings from Last 1 Encounters:  06/11/17 '5\' 2"'  (1.575 m)    Weight:   Wt Readings from Last 1 Encounters:  06/19/17 90 lb (40.8 kg)    Ideal Body Weight:  50 kg  BMI:  Body mass index is 16.46 kg/m.  Estimated Nutritional Needs:    Kcal:  1400-1600  Protein:  70-80 gm  Fluid:  >/= 1.5 L  EDUCATION NEEDS:   No education needs identified at this time  Corrin Parker, MS, RD, LDN Pager # [phone] After hours/ weekend pager # [phone]

## 2017-06-20 ENCOUNTER — Telehealth: Payer: Self-pay | Admitting: Internal Medicine

## 2017-06-20 ENCOUNTER — Encounter: Payer: Self-pay | Admitting: Internal Medicine

## 2017-06-20 ENCOUNTER — Telehealth: Payer: Self-pay | Admitting: Family Medicine

## 2017-06-20 ENCOUNTER — Ambulatory Visit (INDEPENDENT_AMBULATORY_CARE_PROVIDER_SITE_OTHER): Payer: Medicare PPO | Admitting: Internal Medicine

## 2017-06-20 VITALS — BP 130/80 | HR 107

## 2017-06-20 DIAGNOSIS — J9612 Chronic respiratory failure with hypercapnia: Secondary | ICD-10-CM | POA: Diagnosis not present

## 2017-06-20 DIAGNOSIS — J9611 Chronic respiratory failure with hypoxia: Secondary | ICD-10-CM | POA: Diagnosis not present

## 2017-06-20 DIAGNOSIS — J449 Chronic obstructive pulmonary disease, unspecified: Secondary | ICD-10-CM

## 2017-06-20 NOTE — Telephone Encounter (Signed)
Pt daughter want Dr Katrinka BlazingSmith to give her a call back about mother having home care

## 2017-06-20 NOTE — Progress Notes (Signed)
Subjective:    Patient ID: Anita Murphy, female    DOB: Apr 30, 1941    MRN: [id number]    Brief patient profile:  50  yobf quit smoking 2004 with no resp problems until around 2009 referred to pulmonary clinic 05/31/2013 by Dr  Nilda Simmer for ? Copd > proved to have GOLD III severity 08/21/2013    History of Present Illness   Admit date: 06/11/2017 Discharge date: 06/19/2017  Admitted From: home Disposition:  home   Recommendations for Outpatient Follow-up:  1. Hospice/ palliative care to follow at home 2. 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 Breathing is unchanged from yesterday- stable at rest and is exacerbated by ambulating. Cough is mild today. No chest pain. I asked her about anxiety yesterday and again today which she continues to deny. Daughter and RN note anticipatory anxiety in relation to having to walk. She used s/o Morphine this morning when she was short of breath. Not sure if it helped her enough.   Brief Summary: Patient is a 76 year old female with hypertension, tobacco abuse, O2 dependent COPD, severe pulmonary hypertension, presented from home with shortness of breath for 2 days. She has a mild increase in cough and diffuse chest pain when she coughs. No fever or chills. In ED, patient was noticed to be tachypneic with increased work of breathing and was placed on BiPAP. Chest x-ray showed COPD  with emphysema.    Hospital Course:  Principal Problem: Acute on chronic respiratory failure with hypoxia / COPD exacerbation  - frail patient with weight loss and severe COPD-- I have reinforced that she should be a DNR- she is not yet ready to make a decision on this- palliative care spoke with her and family aboutGOC on 6/30- she decided to remain a full code and declined palliative care services. - being treated with steroids, Nebs, Mucinex, Levaquin (course completed) and aggressive pulmonary toilet - BiPAP was being used PRN - Pulmonary assisting with management- weaning steroids -last dose of Solumedrol was 2 days ago when she was transitioned to Prednisone - 6/1- I have discussed palliative care with patient and daughter again today as she continues to be hypoxic and short of breath with activity- They have accepted home hospice services but would like her to remain an full code. 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 Hospitalization Follow-up    Pt states her breathing  has improved some since d/c.  She has had prod cough with brown/green sputum.  She needs xopenex refill.    much better since admit but still sob at rest and now on hospice wishing to discuss code status  Not sure how/ when to use MS vs prn inhalers Did not tol bipap well and doesn't wish to pursue it   No obvious day to day or daytime variability or assoc  mucus plugs or hemoptysis or cp or chest tightness, subjective wheeze or overt sinus or hb symptoms. No unusual exp hx or h/o childhood pna/ asthma or knowledge of premature birth.  Sleeping ok without nocturnal  or early am exacerbation  of respiratory  c/o's or need for noct saba. Also denies any obvious fluctuation of symptoms with weather or environmental changes or other aggravating or alleviating factors except as outlined above   Current Medications, Allergies, Complete Past Medical History, Past Surgical History, Family History, and Social History were reviewed in [facility] electronic medical record.  ROS  The following are not active complaints unless bolded sore throat, dysphagia, dental problems, itching, sneezing,  nasal congestion or excess/ purulent secretions, ear ache,   fever, chills, sweats, unintended wt loss, classically pleuritic or exertional cp,  orthopnea pnd or leg swelling, presyncope, palpitations, abdominal pain, anorexia, nausea, vomiting, diarrhea  or change in bowel or bladder habits, change in stools or urine, dysuria,hematuria,  rash, arthralgias, visual complaints, headache, numbness, weakness or ataxia or problems with walking or coordination,  change in mood/affect or memory.                      Objective:   Physical Exam  W/c bound bf  Severely chronically ill appearing  - - Note on arrival 02 sats  99% on 3lpm      06/22/2017        90 01/18/2016       95  10/16/2015     90 08/31/2015        87  03/23/2015          84  10/21/2014        87  11/19/2013       104  08/21/2013         100      05/31/13 106 lb (48.081 kg)  12/05/12 104 lb (47.174 kg)  10/20/11 121 lb (54.885 kg)    HEENT mild turbinate edema.  Oropharynx no thrush or excess pnd or cobblestoning.  No JVD or cervical adenopathy. Mild accessory muscle hypertrophy. Trachea midline, nl thryroid. Chest was hyperinflated by percussion with diminished breath sounds and moderate increased exp time without wheeze. Hoover sign positive at mid inspiration. Regular rate and rhythm without murmur gallop or rub or increase P2 or edema.  Abd: no hsm, nl excursion. Ext warm without cyanosis or clubbing.        I personally reviewed images and agree with radiology impression as follows:  pCXR:   06/14/17 Severe COPD changes without acute infiltrate.        Assessment & Plan:

## 2017-06-20 NOTE — Telephone Encounter (Signed)
Spoke with Anita Murphy, caseworker, states that pt was d/c'ed from hospital yesterday and was supposed to be ordered a bipap for home use, but this was not ordered.   Patient is seeing MW today at 4:00. Pt will need a new order for bipap and documentation of need in pt's OV notes from today.    Anita Murphy is requesting that we call her back once the bipap order is placed. Callback: [phone].    Sending to MW and LR to make aware.

## 2017-06-20 NOTE — Telephone Encounter (Signed)
Anita DavenportShelby gave me the form - will interoffice to [facility] -pr

## 2017-06-20 NOTE — Patient Instructions (Addendum)
Plan A = Automatic = performist/bud twice daily and atrovent every 6 hours   Plan B = Backup Only use your xopenex  inhaler as a rescue medication to be used if you can't catch your breath by resting or doing a relaxed purse lip breathing pattern.  - The less you use it, the better it will work when you need it. - Ok to use the inhaler up to 2 puffs  every 4 hours if you must but call for appointment if use goes up over your usual need - Don't leave home without it !!  (think of it like the spare tire for your car)   Plan C = Crisis - only use your xopenex nebulizer if you first try Plan B and it fails to help > ok to use the nebulizer up to every 4 hours but if start needing it regularly call for immediate appointment   For persistent air hunger  >  morphine as needed   02 adjusted to saturation low 90s   pulomonary follow up is as needed per Hospice

## 2017-06-20 NOTE — Telephone Encounter (Signed)
See ov 06/20/2017 > do not feel bipap will be helpful here

## 2017-06-20 NOTE — Telephone Encounter (Signed)
Spoke with Darl PikesSusan, aware of MW's recs.  Will close encounter.

## 2017-06-20 NOTE — Care Management (Signed)
Case manager met with patient's daughter, Enid Derry today. She expressed concerns about discharge on yesterday. Paitent was not seen by CM inadvertently. CM was not aware of discharge plan. At this time we received choice for Hospice agency and contacted Dr. Gustavus Bryant office to see if patient will need Bipap at home. CM faxed all necessary information to [facility] [phone], [phone]. CM explained to daughter's that hospice will contact them on Thursday due to Holiday. Hospice will make arrangements to have patient's oxygen order transferred to [facility] they [facility] contract with.  They say patient has enough oxygen and they purchased some of her medications, not the morphine solution. Patient has a 4pm appointment with Dr. Melvyn Novas on today, CM called his office and explained the needs for orders should Bipap be necessary. CM provided [place] with the on call nurse's number should they need her over the holiday. Should Dr. Melvyn Novas order CPAP or Bipap for home order has to go to [facility]in [place], [place], [place]  Ph.[phone] option 4, fax: [phone].  They will be open on July 4th from 8a-5p.

## 2017-06-22 ENCOUNTER — Telehealth: Payer: Self-pay | Admitting: Family Medicine

## 2017-06-22 NOTE — Telephone Encounter (Signed)
Anita Murphy at [facility] has admitted Anita Murphy to Hospice today and wants to know if Dr. Katrinka BlazingSmith will sign on chart to be able to sign off on patient while in hospice   Best number [phone]

## 2017-06-22 NOTE — Assessment & Plan Note (Signed)
-  Spirometry 12/05/12 FEV1  0.51 (30% ratio 40    - PFT's  08/21/2013   FEV1  0.52 ( 33%) ratio 42 and no change B2 - 08/31/2015 p extensive coaching HFA effectiveness =  HFA  0/  90% with dpi > rec tudorza and pulm/perforomist bid neb - could not afford turdorza so changed to qid atrovent 09/19/15  - Hospice rec 05/2016   I had an extended discussion with the patient and daughter reviewing all relevant studies completed to date and  lasting 15 to 20 minutes of a 25 minute visit on the following ongoing concerns:  Though somewhat paradoxic, when the lung fails to clear C02 properly and pC02 rises the lung then becomes a more efficient scavenger of C02 allowing lower work of breathing and  better C02 clearance albeit at a higher serum pC02 level - this is why pts can look a lot better than their ABG's would suggest and why it's so difficult to prognosticate endstage dz.  It's also why I strongly rec DNI status (ventilating pts down to a nl pC02 adversely affects this compensatory mechanism)   I strongly rec no ventilator, no cpap, focus on managing symptoms first with nebs, then with ms if needed  Pulmonary f/u is prn   Each maintenance medication was reviewed in detail including most importantly the difference between maintenance and as needed and under what circumstances the prns are to be used.  Please see AVS for specific  Instructions which are unique to this visit and I personally typed out  which were reviewed in detail in writing with the patient and a copy provided.

## 2017-06-22 NOTE — Assessment & Plan Note (Signed)
D/[facility] on 2lpm [facility]24/[facility]  - HCO3  01/18/16   =  34  - HC03   06/17/17   = 41   rec titrate to keep sats lower 90s

## 2017-06-22 NOTE — Telephone Encounter (Signed)
Clarification given to [facility]

## 2017-06-26 NOTE — Telephone Encounter (Signed)
Please call for details

## 2017-06-26 NOTE — Telephone Encounter (Signed)
Pt requested to give us a call back.

## 2017-06-27 ENCOUNTER — Telehealth: Payer: Self-pay | Admitting: Family Medicine

## 2017-06-27 NOTE — Telephone Encounter (Signed)
Anita Murphy at [facility] is calling to get a new RX for Tussniex 5ml by mouth every 12 hours since the facility changed her to a pharmacy that delivers to them [facility] (phone: [phone]) and (Fax:[phone]) and they need to have it as a new RX since it is Tussinex that doesn't transfer.  Also [name] is requesting something to help patient sleep if Dr. Katrinka BlazingSmith can recommend something  Best number for Anita Shovelizabeth Murphy [phone]

## 2017-06-27 NOTE — Telephone Encounter (Signed)
TAMMI FROM [facility] CALLED STATING THAT PATIENT NOW JUST NEED A NURSE TO DO A NURSE VISIT TO PATIENTS HOME DR Katrinka BlazingSMITH SAID YES IT WAS OK AND THAT SHE WILL FILL OUT PAPERS  TAMMI WILL FAX OVER PAPER WORK TODAY

## 2017-06-28 NOTE — Telephone Encounter (Signed)
Please advise 

## 2017-06-30 NOTE — Telephone Encounter (Signed)
Lanora ManisElizabeth, RN with [facility] is requesting a call back from Dr. Katrinka BlazingSmith regarding pt condition. Pt is now becoming increasingly anxious and SOB on exertion. Would like recommendation and opinion on current medications and plan. Best contact number [phone].

## 2017-07-03 ENCOUNTER — Telehealth: Payer: Self-pay | Admitting: Family Medicine

## 2017-07-07 NOTE — Telephone Encounter (Signed)
Attempted to call daughter.  Will call later.

## 2017-07-18 IMAGING — CR DG CHEST 1V PORT
1 series · 1 of 1 positions shown · non-contrast
Comparison: Portable exam 0508 hours compared to 10/21/2014

CLINICAL DATA: Shortness of breath beginning this morning, history
COPD, former smoker

EXAM:
PORTABLE CHEST - 1 VIEW

[AP]
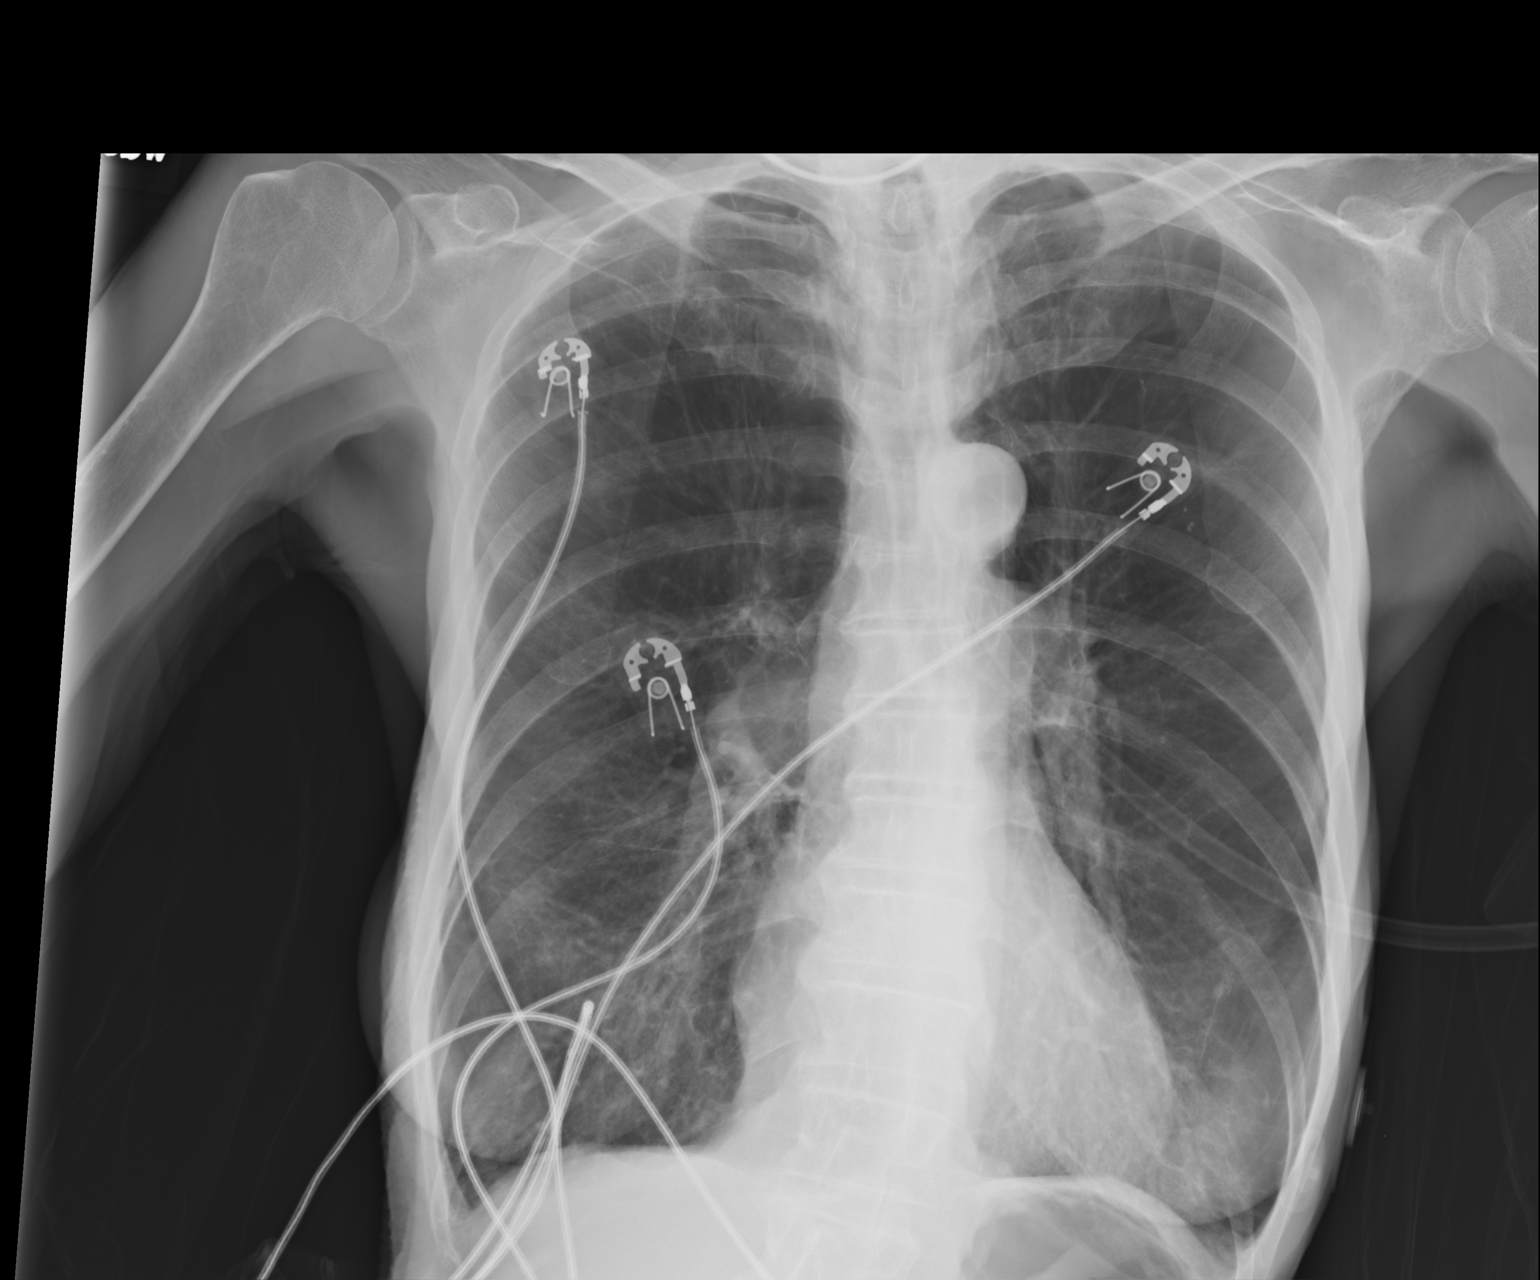

[1 of 1 positions shown; findings below may reference images not displayed]

FINDINGS: Normal heart size and mediastinal contours.

Enlarged central pulmonary arteries question pulmonary arterial
hypertension.

Emphysematous and bronchitic changes consistent with COPD.

No acute infiltrate, pleural effusion, or pneumothorax.

Mild scattered bullous disease.

No acute osseous findings.
IMPRESSION: COPD changes.

Question pulmonary arterial hypertension.

No acute abnormalities.

## 2017-07-19 NOTE — Telephone Encounter (Signed)
FYI dg 

## 2017-07-19 NOTE — Telephone Encounter (Signed)
Spoke with hospice ---- [facility] Friday [facility]; likely will transfer to hospice facility from discharge.  Advised hospice RN to contact office if needs further rx.

## 2017-07-19 NOTE — Telephone Encounter (Signed)
[facility] on Friday, 06/30/17.  Currently admitted at this time.

## 2017-07-19 NOTE — Telephone Encounter (Signed)
PT DAUGHTER CALLING TO LET DR Katrinka BlazingSMITH KNOW THAT HER MOTHER PASS TODAY AND JUST WANT TO THINK DR Katrinka BlazingSMITH FOR THE YEARS OF TAKING CARE OF HER MOTHER

## 2017-07-19 DEATH — deceased
